# Patient Record
Sex: Female | Born: 1955
Health system: Southern US, Community
[De-identification: ages and names within clinical notes are randomized; demographics above are authoritative.]

## PROBLEM LIST (undated history)

## (undated) DIAGNOSIS — M549 Dorsalgia, unspecified: Secondary | ICD-10-CM

## (undated) DIAGNOSIS — D571 Sickle-cell disease without crisis: Secondary | ICD-10-CM

## (undated) DIAGNOSIS — I10 Essential (primary) hypertension: Secondary | ICD-10-CM

## (undated) DIAGNOSIS — G8929 Other chronic pain: Secondary | ICD-10-CM

## (undated) DIAGNOSIS — D649 Anemia, unspecified: Secondary | ICD-10-CM

## (undated) HISTORY — DX: Other chronic pain: G89.29

## (undated) HISTORY — DX: Anemia, unspecified: D64.9

## (undated) HISTORY — DX: Dorsalgia, unspecified: M54.9

## (undated) HISTORY — DX: Essential (primary) hypertension: I10

---

## 2005-10-31 ENCOUNTER — Emergency Department (HOSPITAL_COMMUNITY): Admission: EM | Admit: 2005-10-31 | Discharge: 2005-10-31 | Payer: Self-pay | Admitting: Emergency Medicine

## 2006-04-04 ENCOUNTER — Encounter: Admission: RE | Admit: 2006-04-04 | Discharge: 2006-04-04 | Payer: Self-pay | Admitting: Internal Medicine

## 2006-05-01 ENCOUNTER — Encounter: Admission: RE | Admit: 2006-05-01 | Discharge: 2006-05-01 | Payer: Self-pay | Admitting: Internal Medicine

## 2006-05-30 ENCOUNTER — Emergency Department (HOSPITAL_COMMUNITY): Admission: EM | Admit: 2006-05-30 | Discharge: 2006-05-30 | Payer: Self-pay | Admitting: Emergency Medicine

## 2006-08-23 ENCOUNTER — Emergency Department (HOSPITAL_COMMUNITY): Admission: EM | Admit: 2006-08-23 | Discharge: 2006-08-23 | Payer: Self-pay | Admitting: Emergency Medicine

## 2007-04-30 ENCOUNTER — Emergency Department (HOSPITAL_COMMUNITY): Admission: EM | Admit: 2007-04-30 | Discharge: 2007-04-30 | Payer: Self-pay | Admitting: Emergency Medicine

## 2007-10-15 ENCOUNTER — Encounter: Admission: RE | Admit: 2007-10-15 | Discharge: 2007-10-15 | Payer: Self-pay | Admitting: Internal Medicine

## 2007-11-28 ENCOUNTER — Emergency Department (HOSPITAL_COMMUNITY): Admission: EM | Admit: 2007-11-28 | Discharge: 2007-11-29 | Payer: Self-pay | Admitting: Emergency Medicine

## 2008-08-12 ENCOUNTER — Encounter: Payer: Self-pay | Admitting: Physician Assistant

## 2008-12-20 ENCOUNTER — Emergency Department (HOSPITAL_COMMUNITY): Admission: EM | Admit: 2008-12-20 | Discharge: 2008-12-20 | Payer: Self-pay | Admitting: Emergency Medicine

## 2009-01-05 ENCOUNTER — Emergency Department (HOSPITAL_COMMUNITY): Admission: EM | Admit: 2009-01-05 | Discharge: 2009-01-05 | Payer: Self-pay | Admitting: Emergency Medicine

## 2009-01-05 ENCOUNTER — Emergency Department (HOSPITAL_COMMUNITY): Admission: EM | Admit: 2009-01-05 | Discharge: 2009-01-05 | Payer: Self-pay | Admitting: Family Medicine

## 2009-03-07 ENCOUNTER — Observation Stay (HOSPITAL_COMMUNITY): Admission: EM | Admit: 2009-03-07 | Discharge: 2009-03-07 | Payer: Self-pay | Admitting: Emergency Medicine

## 2009-03-07 ENCOUNTER — Emergency Department (HOSPITAL_COMMUNITY): Admission: EM | Admit: 2009-03-07 | Discharge: 2009-03-07 | Payer: Self-pay | Admitting: Family Medicine

## 2009-03-21 ENCOUNTER — Ambulatory Visit: Payer: Self-pay | Admitting: Physician Assistant

## 2009-03-21 DIAGNOSIS — K219 Gastro-esophageal reflux disease without esophagitis: Secondary | ICD-10-CM | POA: Insufficient documentation

## 2009-03-21 DIAGNOSIS — M545 Low back pain: Secondary | ICD-10-CM

## 2009-03-21 DIAGNOSIS — D571 Sickle-cell disease without crisis: Secondary | ICD-10-CM

## 2009-03-21 DIAGNOSIS — Z9849 Cataract extraction status, unspecified eye: Secondary | ICD-10-CM

## 2009-03-21 DIAGNOSIS — I10 Essential (primary) hypertension: Secondary | ICD-10-CM | POA: Insufficient documentation

## 2009-03-21 DIAGNOSIS — J309 Allergic rhinitis, unspecified: Secondary | ICD-10-CM | POA: Insufficient documentation

## 2009-03-21 DIAGNOSIS — F329 Major depressive disorder, single episode, unspecified: Secondary | ICD-10-CM

## 2009-03-22 ENCOUNTER — Encounter: Payer: Self-pay | Admitting: Physician Assistant

## 2009-03-22 ENCOUNTER — Emergency Department (HOSPITAL_COMMUNITY): Admission: EM | Admit: 2009-03-22 | Discharge: 2009-03-22 | Payer: Self-pay | Admitting: Emergency Medicine

## 2009-03-25 ENCOUNTER — Emergency Department (HOSPITAL_COMMUNITY): Admission: EM | Admit: 2009-03-25 | Discharge: 2009-03-25 | Payer: Self-pay | Admitting: Emergency Medicine

## 2009-03-25 ENCOUNTER — Encounter: Payer: Self-pay | Admitting: Physician Assistant

## 2009-03-25 LAB — CONVERTED CEMR LAB
HCT: 30.8 %
Hemoglobin: 10.4 g/dL

## 2009-03-29 ENCOUNTER — Ambulatory Visit: Payer: Self-pay | Admitting: Physician Assistant

## 2009-03-29 DIAGNOSIS — S139XXA Sprain of joints and ligaments of unspecified parts of neck, initial encounter: Secondary | ICD-10-CM

## 2009-03-29 DIAGNOSIS — E876 Hypokalemia: Secondary | ICD-10-CM | POA: Insufficient documentation

## 2009-04-01 ENCOUNTER — Telehealth (INDEPENDENT_AMBULATORY_CARE_PROVIDER_SITE_OTHER): Payer: Self-pay | Admitting: *Deleted

## 2009-04-08 LAB — CONVERTED CEMR LAB
BUN: 14 mg/dL (ref 6–23)
Basophils Absolute: 0 10*3/uL (ref 0.0–0.1)
Creatinine, Ser: 0.87 mg/dL (ref 0.40–1.20)
Eosinophils Absolute: 0.1 10*3/uL (ref 0.0–0.7)
Eosinophils Relative: 1 % (ref 0–5)
Glucose, Bld: 103 mg/dL — ABNORMAL HIGH (ref 70–99)
HCT: 30.1 % — ABNORMAL LOW (ref 36.0–46.0)
MCHC: 35.9 g/dL (ref 30.0–36.0)
MCV: 69.7 fL — ABNORMAL LOW (ref 78.0–100.0)
Platelets: 146 10*3/uL — ABNORMAL LOW (ref 150–400)
RDW: 18.1 % — ABNORMAL HIGH (ref 11.5–15.5)

## 2009-04-18 ENCOUNTER — Ambulatory Visit: Payer: Self-pay | Admitting: Physician Assistant

## 2009-04-18 LAB — CONVERTED CEMR LAB
BUN: 6 mg/dL (ref 6–23)
Basophils Relative: 0 % (ref 0–1)
CO2: 23 meq/L (ref 19–32)
Calcium: 9.2 mg/dL (ref 8.4–10.5)
Chloride: 107 meq/L (ref 96–112)
Creatinine, Ser: 0.75 mg/dL (ref 0.40–1.20)
Eosinophils Absolute: 0.1 10*3/uL (ref 0.0–0.7)
Eosinophils Relative: 1 % (ref 0–5)
HCT: 28.7 % — ABNORMAL LOW (ref 36.0–46.0)
MCHC: 35.5 g/dL (ref 30.0–36.0)
MCV: 73 fL — ABNORMAL LOW (ref 78.0–100.0)
Monocytes Relative: 7 % (ref 3–12)
Neutrophils Relative %: 59 % (ref 43–77)
Platelets: 135 10*3/uL — ABNORMAL LOW (ref 150–400)
RBC: 3.93 M/uL (ref 3.87–5.11)

## 2009-04-19 ENCOUNTER — Encounter: Payer: Self-pay | Admitting: Physician Assistant

## 2009-04-20 ENCOUNTER — Ambulatory Visit: Payer: Self-pay | Admitting: Physician Assistant

## 2009-06-03 ENCOUNTER — Telehealth: Payer: Self-pay | Admitting: Physician Assistant

## 2009-06-03 ENCOUNTER — Encounter: Payer: Self-pay | Admitting: Physician Assistant

## 2009-06-09 ENCOUNTER — Telehealth: Payer: Self-pay | Admitting: Physician Assistant

## 2009-06-16 ENCOUNTER — Encounter: Payer: Self-pay | Admitting: Physician Assistant

## 2009-06-27 ENCOUNTER — Ambulatory Visit: Payer: Self-pay | Admitting: Physician Assistant

## 2009-06-27 DIAGNOSIS — H9209 Otalgia, unspecified ear: Secondary | ICD-10-CM | POA: Insufficient documentation

## 2009-06-27 DIAGNOSIS — M546 Pain in thoracic spine: Secondary | ICD-10-CM

## 2009-07-26 ENCOUNTER — Encounter: Payer: Self-pay | Admitting: Physician Assistant

## 2009-08-02 ENCOUNTER — Telehealth: Payer: Self-pay | Admitting: Physician Assistant

## 2009-09-14 ENCOUNTER — Telehealth (INDEPENDENT_AMBULATORY_CARE_PROVIDER_SITE_OTHER): Payer: Self-pay | Admitting: Nurse Practitioner

## 2009-11-15 ENCOUNTER — Ambulatory Visit: Payer: Self-pay | Admitting: Physician Assistant

## 2009-11-15 DIAGNOSIS — R05 Cough: Secondary | ICD-10-CM

## 2009-11-15 DIAGNOSIS — R059 Cough, unspecified: Secondary | ICD-10-CM | POA: Insufficient documentation

## 2009-11-29 ENCOUNTER — Encounter (INDEPENDENT_AMBULATORY_CARE_PROVIDER_SITE_OTHER): Payer: Self-pay | Admitting: Nurse Practitioner

## 2009-11-29 ENCOUNTER — Telehealth: Payer: Self-pay | Admitting: Physician Assistant

## 2009-11-29 ENCOUNTER — Ambulatory Visit: Payer: Self-pay | Admitting: Physician Assistant

## 2009-11-29 LAB — CONVERTED CEMR LAB
CO2: 25 meq/L (ref 19–32)
Calcium: 8.7 mg/dL (ref 8.4–10.5)
Chloride: 104 meq/L (ref 96–112)
Sodium: 139 meq/L (ref 135–145)

## 2009-12-01 ENCOUNTER — Encounter (INDEPENDENT_AMBULATORY_CARE_PROVIDER_SITE_OTHER): Payer: Self-pay | Admitting: Nurse Practitioner

## 2010-02-08 ENCOUNTER — Telehealth: Payer: Self-pay | Admitting: Physician Assistant

## 2010-02-09 ENCOUNTER — Telehealth: Payer: Self-pay | Admitting: Physician Assistant

## 2010-02-17 ENCOUNTER — Telehealth (INDEPENDENT_AMBULATORY_CARE_PROVIDER_SITE_OTHER): Payer: Self-pay | Admitting: *Deleted

## 2010-02-20 ENCOUNTER — Telehealth: Payer: Self-pay | Admitting: Physician Assistant

## 2010-03-17 ENCOUNTER — Telehealth: Payer: Self-pay | Admitting: Physician Assistant

## 2010-06-05 ENCOUNTER — Ambulatory Visit: Payer: Self-pay | Admitting: Nurse Practitioner

## 2010-06-06 ENCOUNTER — Encounter (INDEPENDENT_AMBULATORY_CARE_PROVIDER_SITE_OTHER): Payer: Self-pay | Admitting: Nurse Practitioner

## 2010-06-06 LAB — CONVERTED CEMR LAB
Basophils Absolute: 0 K/uL (ref 0.0–0.1)
Basophils Relative: 1 % (ref 0–1)
Eosinophils Absolute: 0.1 K/uL (ref 0.0–0.7)
Eosinophils Relative: 2 % (ref 0–5)
HCT: 28.4 % — ABNORMAL LOW (ref 36.0–46.0)
Hemoglobin: 10.2 g/dL — ABNORMAL LOW (ref 12.0–15.0)
Lymphocytes Relative: 33 % (ref 12–46)
Lymphs Abs: 1.4 K/uL (ref 0.7–4.0)
MCHC: 35.9 g/dL (ref 30.0–36.0)
MCV: 71.4 fL — ABNORMAL LOW (ref 78.0–100.0)
Monocytes Absolute: 0.3 K/uL (ref 0.1–1.0)
Monocytes Relative: 6 % (ref 3–12)
Neutro Abs: 2.5 K/uL (ref 1.7–7.7)
Neutrophils Relative %: 59 % (ref 43–77)
Platelets: 154 K/uL (ref 150–400)
RBC: 3.98 M/uL (ref 3.87–5.11)
RDW: 17.5 % — ABNORMAL HIGH (ref 11.5–15.5)
Retic Ct Pct: 2 % (ref 0.4–3.1)
WBC: 4.3 10*3/microliter (ref 4.0–10.5)

## 2010-06-07 ENCOUNTER — Telehealth (INDEPENDENT_AMBULATORY_CARE_PROVIDER_SITE_OTHER): Payer: Self-pay | Admitting: Nurse Practitioner

## 2010-06-16 ENCOUNTER — Ambulatory Visit: Payer: Self-pay | Admitting: Oncology

## 2010-06-21 ENCOUNTER — Encounter (INDEPENDENT_AMBULATORY_CARE_PROVIDER_SITE_OTHER): Payer: Self-pay | Admitting: Nurse Practitioner

## 2010-06-21 LAB — COMPREHENSIVE METABOLIC PANEL
BUN: 6 mg/dL (ref 6–23)
CO2: 26 mEq/L (ref 19–32)
Calcium: 8.9 mg/dL (ref 8.4–10.5)
Chloride: 107 mEq/L (ref 96–112)
Creatinine, Ser: 0.86 mg/dL (ref 0.40–1.20)
Total Bilirubin: 1.4 mg/dL — ABNORMAL HIGH (ref 0.3–1.2)

## 2010-06-21 LAB — CBC & DIFF AND RETIC
BASO%: 0.3 % (ref 0.0–2.0)
Basophils Absolute: 0 10*3/uL (ref 0.0–0.1)
EOS%: 2.1 % (ref 0.0–7.0)
HGB: 10.6 g/dL — ABNORMAL LOW (ref 11.6–15.9)
MCH: 25.9 pg (ref 25.1–34.0)
MCHC: 36.6 g/dL — ABNORMAL HIGH (ref 31.5–36.0)
MCV: 70.9 fL — ABNORMAL LOW (ref 79.5–101.0)
MONO%: 4.5 % (ref 0.0–14.0)
NEUT%: 50 % (ref 38.4–76.8)
RDW: 17.1 % — ABNORMAL HIGH (ref 11.2–14.5)
Retic Ct Abs: 74.85 10*3/uL — ABNORMAL HIGH (ref 18.30–72.70)
lymph#: 1.6 10*3/uL (ref 0.9–3.3)

## 2010-06-21 LAB — CHCC SMEAR

## 2010-06-21 LAB — MORPHOLOGY
PLT EST: DECREASED
RBC Comments: ABNORMAL

## 2010-06-21 LAB — LACTATE DEHYDROGENASE: LDH: 148 U/L (ref 94–250)

## 2010-06-22 LAB — HIV ANTIBODY (ROUTINE TESTING W REFLEX): HIV: NONREACTIVE

## 2010-06-22 LAB — HEPATITIS PANEL, ACUTE
HCV Ab: NEGATIVE
Hep B C IgM: NEGATIVE
Hepatitis B Surface Ag: NEGATIVE

## 2010-06-22 LAB — FERRITIN: Ferritin: 60 ng/mL (ref 10–291)

## 2010-06-23 ENCOUNTER — Telehealth (INDEPENDENT_AMBULATORY_CARE_PROVIDER_SITE_OTHER): Payer: Self-pay | Admitting: Internal Medicine

## 2010-06-27 ENCOUNTER — Encounter (INDEPENDENT_AMBULATORY_CARE_PROVIDER_SITE_OTHER): Payer: Self-pay | Admitting: Nurse Practitioner

## 2010-06-27 ENCOUNTER — Ambulatory Visit (HOSPITAL_COMMUNITY)
Admission: RE | Admit: 2010-06-27 | Discharge: 2010-06-27 | Payer: Self-pay | Source: Home / Self Care | Attending: Oncology | Admitting: Oncology

## 2010-07-05 ENCOUNTER — Encounter (INDEPENDENT_AMBULATORY_CARE_PROVIDER_SITE_OTHER): Payer: Self-pay | Admitting: Nurse Practitioner

## 2010-07-07 ENCOUNTER — Encounter (INDEPENDENT_AMBULATORY_CARE_PROVIDER_SITE_OTHER): Payer: Self-pay | Admitting: Nurse Practitioner

## 2010-07-10 ENCOUNTER — Ambulatory Visit: Admit: 2010-07-10 | Payer: Self-pay | Admitting: Nurse Practitioner

## 2010-07-22 ENCOUNTER — Encounter: Payer: Self-pay | Admitting: Internal Medicine

## 2010-07-23 ENCOUNTER — Encounter: Payer: Self-pay | Admitting: Internal Medicine

## 2010-07-27 ENCOUNTER — Ambulatory Visit: Payer: Self-pay | Admitting: Oncology

## 2010-07-31 ENCOUNTER — Encounter (INDEPENDENT_AMBULATORY_CARE_PROVIDER_SITE_OTHER): Payer: Self-pay | Admitting: Internal Medicine

## 2010-07-31 ENCOUNTER — Encounter (INDEPENDENT_AMBULATORY_CARE_PROVIDER_SITE_OTHER): Payer: Self-pay | Admitting: Nurse Practitioner

## 2010-07-31 LAB — CBC & DIFF AND RETIC
Basophils Absolute: 0 10*3/uL (ref 0.0–0.1)
Eosinophils Absolute: 0.1 10*3/uL (ref 0.0–0.5)
HCT: 32.3 % — ABNORMAL LOW (ref 34.8–46.6)
HGB: 11.1 g/dL — ABNORMAL LOW (ref 11.6–15.9)
LYMPH%: 26 % (ref 14.0–49.7)
MCV: 77.9 fL — ABNORMAL LOW (ref 79.5–101.0)
MONO#: 0.3 10*3/uL (ref 0.1–0.9)
MONO%: 6.5 % (ref 0.0–14.0)
NEUT#: 3.3 10*3/uL (ref 1.5–6.5)
NEUT%: 65.7 % (ref 38.4–76.8)
Platelets: 155 10*3/uL (ref 145–400)
Retic %: 2.3 % — ABNORMAL HIGH (ref 0.50–1.50)
WBC: 5.1 10*3/uL (ref 3.9–10.3)

## 2010-08-01 NOTE — Progress Notes (Signed)
Summary: NEEDS TRAMADOL  Phone Note Call from Patient Call back at Home Phone [phone]   Reason for Call: Refill Medication Summary of Call: MARTIN PT. MS Stender CALLED THIS MORNING FOR HER REFILL ON TRAMADOL FROM THE HEALTH DEPT. AND SHE HAD ME TO TALK W/THE LADY IN THE PHARMACY, AND I MENTIONED TO HER THAT SHE WOULD NEED TO FAX OVER A REQUEST. MS Barre SAYS THAT SHE IS NOT FEELING WELL AND NEEDS HER MEDICINE. SHE IS AT THE PHARMACY NOW. Initial call taken by: Leodis Rains,  September 14, 2009 3:51 PM  Follow-up for Phone Call        forward to N. Daphine Deutscher, fnp Follow-up by: Levon Hedger,  September 15, 2009 10:33 AM  Additional Follow-up for Phone Call Additional follow up Details #1::        I refilled tramadol on 09/14/2009 (yesterday) and it should have been faxed to the pharmacy. call pt to see if she recieved the medication from the pharmacy Additional Follow-up by: Lehman Prom FNP,  September 15, 2009 12:02 PM    Additional Follow-up for Phone Call Additional follow up Details #2::    Levon Hedger  September 15, 2009 12:13 PM Left message on machine for pt to return call to the office  Levon Hedger  September 15, 2009 4:45 PM Pt has picked up Rx.

## 2010-08-01 NOTE — Letter (Signed)
Summary: RECORDS FROM PRIOR PCP  RECORDS FROM PRIOR PCP   Imported By: Arta Bruce 07/26/2009 11:33:59  _____________________________________________________________________  External Attachment:    Type:   Image     Comment:   External Document

## 2010-08-01 NOTE — Progress Notes (Signed)
Summary: Hematology Referral  Phone Note Outgoing Call   Summary of Call: Referral ordered for [facility] Send last 2 office visits and recent labs with referral  Initial call taken by: Lehman Prom FNP,  June 07, 2010 3:28 PM  Follow-up for Phone Call        SEND REFERRAL TO [facility] WAITING FOR AN APPT Follow-up by: Cheryll Dessert,  June 07, 2010 7:39 PM

## 2010-08-01 NOTE — Progress Notes (Signed)
Summary: Refill request  Phone Note Call from Patient Call back at [phone]   Caller: C[facility] Summary of Call:  pt needs a refill on omeperzole 20mg .... Initial call taken by: Mikey College CMA,  August 02, 2009 10:09 AM  Follow-up for Phone Call        The pt called back again requesting for refills from her omeprazole 200 mg.Marland KitchenMarland KitchenManon Hilding  August 03, 2009 9:08 AM  Additional Follow-up for Phone Call Additional follow up Details #1::        pt is aware that it is faxed already Additional Follow-up by: Armenia Shannon,  August 03, 2009 9:15 AM    Prescriptions: OMEPRAZOLE 20 MG CPDR (OMEPRAZOLE) one cap by mouth daily  #30 x 5   Entered and Authorized by:   Tereso Newcomer PA-C   Signed by:   Tereso Newcomer PA-C on 08/02/2009   Method used:   Faxed to ...       [facility] (retail)       [address] [place], [place]  [address]       Ph: [phone]       Fax: [phone]   RxID:   [id number]

## 2010-08-01 NOTE — Progress Notes (Signed)
Summary: Persistent allergy symptoms  Phone Note Outgoing Call   Summary of Call: pt says she is still having the cough. pt says its ... health dept.... pt says she wants to use zyrtec. Initial call taken by: Armenia Shannon,  Nov 29, 2009 10:38 AM  Follow-up for Phone Call        Seen as nurse visit - c/o allergy symptoms, postnasal drip, persistent cough.  No other symptoms.  OTC medications, fluids, elevated head of bed and closed windows recommended.  Dutch Quint, RN 11/29/09 11:05 am.

## 2010-08-01 NOTE — Progress Notes (Signed)
Summary: REFILL ON ALLERGY MED & BP  Phone Note Call from Patient Call back at Home Phone [phone]   Reason for Call: Refill Medication Summary of Call: WEAVER PT. Kayla Kayla Woodard SHOWED UP AT THE [facility] THIS MORNING FOR HER BP MED (COZARR) AND HER XYZAL. I SPOKE WITH THE LADY I PHARMACY AND SHE SAYS THAT SINECE Kayla Woodard DOESNT HAVE A SOCIAL # SHE DOESNT QUALIFY FOR THE ICP PROGRAM, BECAUSE THEY WOULD HAVE TO ORDER THE MEDICATIONS BECAUSE THE DON'T BUY IT. PHARMACY SAID THEY WOULD TRY TO WORK WITH HER ON HER BLOOD PRESS. MEDS. AS FAR AS COST WISE. SHE CANT TAKE LISINOPRIL, BECAUSE IT MAKES HER COUGH. Initial call taken by: Leodis Rains,  February 20, 2010 9:09 AM  Follow-up for Phone Call        forward to provider Follow-up by: Armenia Shannon,  February 20, 2010 10:02 AM  Additional Follow-up for Phone Call Additional follow up Details #1::        Why can't she get her prescriptions filled at [facility] pharmacy? Additional Follow-up by: Tereso Newcomer PA-C,  February 20, 2010 2:09 PM    Additional Follow-up for Phone Call Additional follow up Details #2::    SCOTT I CALLED AND SPOKE WITH WANDA IN OUR PHARMACY, AND SHE SAYS THAT Kayla Woodard WOULD HAVE TO GET LOSARTIN, BECAUSE U CAN'T GET COZARR FROM THE DRUG COMP. AND HER XYZAL, SHE'LL HAVE TO GET LORATADINE, BECAUSE SHE WOULD HAVE TO HAVE A S.S. CARD FOR THE DRUG COMP.Cala Bradford Tinnin  February 20, 2010 3:48 PM   Left message on answering machine for pt to call back.Marland KitchenMarland KitchenArmenia Shannon  February 20, 2010 5:27 PM   pt is aware.... Armenia Shannon  February 21, 2010 8:22 AM   New/Updated Medications: LOSARTAN POTASSIUM 100 MG TABS (LOSARTAN POTASSIUM) Take 1 tablet by mouth once a day LORATADINE 10 MG TABS (LORATADINE) Take 1 tablet by mouth once a day as needed for allergies Prescriptions: LORATADINE 10 MG TABS (LORATADINE) Take 1 tablet by mouth once a day as needed for allergies  #30 x 6   Entered and Authorized by:   Tereso Newcomer PA-C   Signed  by:   Tereso Newcomer PA-C on 02/20/2010   Method used:   Faxed to ...       [facility] (retail)       [address] [place], [place]  [address]       Ph: [phone]       Fax: [phone]   RxID:   [id number] LOSARTAN POTASSIUM 100 MG TABS (LOSARTAN POTASSIUM) Take 1 tablet by mouth once a day  #30 x 11   Entered and Authorized by:   Tereso Newcomer PA-C   Signed by:   Tereso Newcomer PA-C on 02/20/2010   Method used:   Faxed to ...       [facility] (retail)       [address] [place], [place]  [address]       Ph: [phone]       Fax: [phone]   RxID:   [id number]

## 2010-08-01 NOTE — Assessment & Plan Note (Signed)
Summary: Sickle Cell Anemia   Vital Signs:  Patient profile:   55 year old female Height:      61.75 inches Weight:      172 pounds BMI:     31.83 O2 Sat:      100 % on Room air Temp:     97.1 degrees F oral Pulse rate:   76 / minute Pulse rhythm:   regular Resp:     18 per minute BP sitting:   120 / 82  (left arm) Cuff size:   regular  Vitals Entered By: Armenia Shannon (June 05, 2010 9:44 AM)  Nutrition Counseling: Patient's BMI is greater than 25 and therefore counseled on weight management options.  O2 Flow:  Room air CC: check up for sickle cell and anemia... pt says she needs refills..., Hypertension Management, Abdominal Pain Is Patient Diabetic? No Pain Assessment Patient in pain? no       Does patient need assistance? Functional Status Self care Ambulation Normal   Primary Care Provider:  Tereso Newcomer PA-C  CC:  check up for sickle cell and anemia... pt says she needs refills..., Hypertension Management, and Abdominal Pain.  History of Present Illness:  Pt into the office for f/u on anemia and sickle Cell Pt established at [facility], which she reports she went in January 2011 but pt accured a bill and she is not willing to return.  She states that her medication was changed from Tramadol to Morphine but she is not able to pay for the medication.   Last hospitalization for sickle Cell was 1 year ago. Sister(living - still in [place])  and twin is affected with the disease (deceased) Pt was last hospitalized at [facility] on last year.  No hospitalizations this year.  Social - pt is from [place]. She has been in this country for 7 years. Pt has a case worker that is helping with her immigration paperwork.  Dyspepsia History:      She has no alarm features of dyspepsia including no history of melena, hematochezia, dysphagia, persistent vomiting, or involuntary weight loss > 5%.  There is a prior history of GERD.  The patient does not have a prior history of  documented ulcer disease.  The dominant symptom is not heartburn or acid reflux.  An H-2 blocker medication is not currently being taken.  She has no history of a positive H. Pylori serology.    Hypertension History:      She denies headache, chest pain, and palpitations.  Pt has taken her BP meds today.        Positive major cardiovascular risk factors include hypertension.  Negative major cardiovascular risk factors include female age less than 48 years old and non-tobacco-user status.      Habits & Providers  Alcohol-Tobacco-Diet     Alcohol drinks/day: 0     Tobacco Status: never  Exercise-Depression-Behavior     Does Patient Exercise: yes     Type of exercise: walk     Times/week: 4     Depression Counseling: further diagnostic testing and/or other treatment is indicated     Drug Use: no  Current Medications (verified): 1)  Tramadol Hcl 50 Mg Tabs (Tramadol Hcl) .... Take 1 Tablet By Mouth Three Times A Day As Needed For Severe Pain. 2)  Protonix 40 Mg Tbec (Pantoprazole Sodium) .... Take 1 Tablet By Mouth Once A Day 3)  Flonase 50 Mcg/act Susp (Fluticasone Propionate) .Marland Kitchen.. 1 Spray Each Nostril Once Daily  4)  Losartan Potassium 100 Mg Tabs (Losartan Potassium) .... Take 1 Tablet By Mouth Once A Day 5)  Loratadine 10 Mg Tabs (Loratadine) .... Take 1 Tablet By Mouth Once A Day As Needed For Allergies  Allergies (verified): 1)  ! * Oxycodone  Social History: moved to [place] 7 years ago from [place] in [place] (husband and children still in [place]) Never Smoked Alcohol use-no Drug use-no not working  Review of Systems General:  Denies fever. CV:  Denies chest pain or discomfort and shortness of breath with exertion. Resp:  Denies chest discomfort and shortness of breath. GI:  Complains of indigestion. MS:  Complains of low back pain; legs at time, neck.  Physical Exam  General:  alert.   Head:  normocephalic.   Ears:  ear piercing(s) noted.   Lungs:  normal breath  sounds.   Heart:  normal rate and regular rhythm.   Msk:  up to the exam table Neurologic:  alert & oriented X3.   Skin:  color normal.   Psych:  Oriented X3.     Impression & Recommendations:  Problem # 1:  ESSENTIAL HYPERTENSION, BENIGN (ICD-401.1)  BP stable at this time continue  current medications Her updated medication list for this problem includes:    Losartan Potassium 100 Mg Tabs (Losartan potassium) .Marland Kitchen... Take 1 tablet by mouth once a day  Orders: T-Lipid Profile (78295-62130) T-Comprehensive Metabolic Panel (86578-46962) T-TSH (95284-13244) Rapid HIV  (01027)  Problem # 2:  GERD (ICD-530.81) Advised pt of foods to avoid she can get refills on protonix from [place] pharmacy Her updated medication list for this problem includes:    Protonix 40 Mg Tbec (Pantoprazole sodium) .Marland Kitchen... Take 1 tablet by mouth once a day  Problem # 3:  SICKLE-CELL DISEASE, UNSPECIFIED (ICD-282.60)  pt is NOT going to the [facility] due to cost will refill pain medications  Orders: T-CBC w/Diff (25366-44034) T-Sed Rate (Automated) (74259-56387) T-Reticulocyte Count, Manual (56433)  Problem # 4:  NEED PROPHYLACTIC VACCINATION&INOCULATION FLU (ICD-V04.81) given today in office  Complete Medication List: 1)  Tramadol Hcl 50 Mg Tabs (Tramadol hcl) .... Take 1 tablet by mouth three times a day as needed for severe pain. 2)  Protonix 40 Mg Tbec (Pantoprazole sodium) .... Take 1 tablet by mouth once a day 3)  Flonase 50 Mcg/act Susp (Fluticasone propionate) .Marland Kitchen.. 1 spray each nostril once daily 4)  Losartan Potassium 100 Mg Tabs (Losartan potassium) .... Take 1 tablet by mouth once a day 5)  Loratadine 10 Mg Tabs (Loratadine) .... Take 1 tablet by mouth once a day as needed for allergies  Other Orders: Flu Vaccine 73yrs + (29518) Admin 1st Vaccine (84166)  Hypertension Assessment/Plan:      The patient's hypertensive risk group is category A: No risk factors and no target organ  damage.  Today's blood pressure is 120/82.  Her blood pressure goal is < 140/90.  Patient Instructions: 1)  You have been given the flu vaccine today 2)  Sign a release to get records from [facility].  Need last  2 office visits and labs. 3)  There is a medication named Hydrourea that may be helpful with your sickle cell however this medication must be monitored by a specialist.  Will see if the specialist here in [place] will be able to assist. 4)  Be sure to drink plenty of water 5)  Refills on Tramadol sent to [facility] 6)  Schedule an appointment  for a Complete physical exam 7)  You will need EKG, u/a. 8)  Will review labs   Orders Added: 1)  Est. Patient Level IV [[address]] 2)  T-Lipid Profile [80061-22930] 3)  T-Comprehensive Metabolic Panel [80053-22900] 4)  T-CBC w/Diff [60454-09811] 5)  T-TSH [91478-29562] 6)  Rapid HIV  [92370] 7)  T-Sed Rate (Automated) [13086-57846] 8)  T-Reticulocyte Count, Manual [85044] 9)  Flu Vaccine 53yrs + [90658] 10)  Admin 1st Vaccine [96295]   Immunizations Administered:  Influenza Vaccine # 1:    Vaccine Type: Fluvax 3+    Site: left deltoid    Mfr: GlaxoSmithKline    Dose: 0.5 ml    Route: IM    Given by: Levon Hedger    Exp. Date: 12/30/2010    Lot #: [id number]    VIS given: 01/24/10 version given June 05, 2010.  Flu Vaccine Consent Questions:    Do you have a history of severe allergic reactions to this vaccine? no    Any prior history of allergic reactions to egg and/or gelatin? no    Do you have a sensitivity to the preservative Thimersol? no    Do you have a past history of Guillan-Barre Syndrome? no    Do you currently have an acute febrile illness? no    Have you ever had a severe reaction to latex? no    Vaccine information given and explained to patient? yes    Are you currently pregnant? no    ndc  [id number]  Immunizations Administered:  Influenza Vaccine # 1:    Vaccine Type:  Fluvax 3+    Site: left deltoid    Mfr: GlaxoSmithKline    Dose: 0.5 ml    Route: IM    Given by: Levon Hedger    Exp. Date: 12/30/2010    Lot #: [id number]    VIS given: 01/24/10 version given June 05, 2010.  Prevention & Chronic Care Immunizations   Influenza vaccine: Fluvax 3+  (06/05/2010)    Tetanus booster: 07/07/2007: Historical per patient    Pneumococcal vaccine: Not documented  Colorectal Screening   Hemoccult: NEG x 3  (06/09/2009)    Colonoscopy: Not documented  Other Screening   Pap smear: Not documented    Mammogram: Not documented   Smoking status: never  (06/05/2010)  Lipids   Total Cholesterol: Not documented   LDL: Not documented   LDL Direct: Not documented   HDL: Not documented   Triglycerides: Not documented  Hypertension   Last Blood Pressure: 120 / 82  (06/05/2010)   Serum creatinine: 0.74  (11/29/2009)   Serum potassium 4.4  (11/29/2009) CMP ordered   Self-Management Support :    Hypertension self-management support: Not documented   Nursing Instructions: Give Flu vaccine today   Laboratory Results  Date/Time Received: June 05, 2010 11:21 AM   Other Tests  Rapid HIV: negative

## 2010-08-01 NOTE — Progress Notes (Signed)
Summary: Refills  Phone Note Call from Patient Call back at [phone]   Summary of Call: The pt needs more refills from the tramadol medication and she wanted to share with the provider that lisinopril make her sick (cough).  [facility] pharmacy [address]).  Please let her know when is ready. Alben Spittle PA-c  Initial call taken by: Manon Hilding,  February 17, 2010 12:01 PM  Follow-up for Phone Call        Rx for tramadol printed - fax to [facility] also have note from [facility] - they no longer have ARBs on the formulary.  Will have PCP decide what to change pt to Follow-up by: Lehman Prom FNP,  February 17, 2010 4:37 PM  Additional Follow-up for Phone Call Additional follow up Details #1::        Rx faxed to [facility]. pt notified. Additional Follow-up by: Levon Hedger,  February 17, 2010 4:41 PM    Prescriptions: TRAMADOL HCL 50 MG TABS (TRAMADOL HCL) Take 1 tablet by mouth three times a day as needed for severe pain.  #90 x 0   Entered and Authorized by:   Lehman Prom FNP   Signed by:   Lehman Prom FNP on 02/17/2010   Method used:   Printed then faxed to ...       [facility] (retail)       [address] [place], [place]  [address]       Ph: [phone]       Fax: [phone]   RxID:   [id number]

## 2010-08-01 NOTE — Progress Notes (Signed)
Summary: Cozaar refill  Phone Note Call from Patient   Summary of Call: THE PT NEEDS REFILLS FROM ALL HER MEDICATIONS ESPCIEALLY LISINOPRIL. Marland Kitchen PT STILL HAS SOME REFILLS BUT i HAD PROBLEMS FOR HER TO FOLLOW ME OVER THE PHONE. WEAVER PA-C Initial call taken by: Manon Hilding,  February 08, 2010 11:34 AM  Follow-up for Phone Call        Left message on answering machine for pt to call back...Marland KitchenMarland KitchenArmenia Shannon  February 08, 2010 12:49 PM   pt says she needs a refill on her cozaar... pt says the health dept no longer carries med and at [facility] $56... spoke with health dept and they said they no longer carry that med.... pt would like to know if you can get her another med but cheaper if she has to go to [facility].... pt says she can not take lisinopril because it makes her cough... she said she took one pill from someone and she coughed all night last night Follow-up by: Armenia Shannon,  February 08, 2010 4:43 PM  Additional Follow-up for Phone Call Additional follow up Details #1::        She can get at [facility]. pharmacy. Will send Rx for cozaar there.  Additional Follow-up by: Tereso Newcomer PA-C,  February 08, 2010 5:25 PM    Additional Follow-up for Phone Call Additional follow up Details #2::    pt is aware Follow-up by: Armenia Shannon,  February 09, 2010 9:49 AM  Prescriptions: COZAAR 100 MG TABS (LOSARTAN POTASSIUM) Take 1 tablet by mouth once a day for blood pressure  #30 x 5   Entered and Authorized by:   Tereso Newcomer PA-C   Signed by:   Tereso Newcomer PA-C on 02/08/2010   Method used:   Faxed to ...       [facility] (retail)       [address] [place], [place]  [address]       Ph: [phone]       Fax: [phone]   RxID:   [id number]

## 2010-08-01 NOTE — Progress Notes (Signed)
  Phone Note Call from Patient   Summary of Call: pt says she needs a med for allergies Initial call taken by: Manon Hilding,  February 09, 2010 9:48 AM  Follow-up for Phone Call        pt says she needs something called in a med for her allergies.... health dept Follow-up by: Armenia Shannon,  February 09, 2010 9:52 AM  Additional Follow-up for Phone Call Additional follow up Details #1::        pt aware Additional Follow-up by: Armenia Shannon,  February 09, 2010 12:07 PM    Prescriptions: XYZAL 5 MG TABS (LEVOCETIRIZINE DIHYDROCHLORIDE) 1 tab by mouth daily for allergies  #30 x 11   Entered and Authorized by:   Tereso Newcomer PA-C   Signed by:   Tereso Newcomer PA-C on 02/09/2010   Method used:   Faxed to ...       [facility] Department (retail)       [address] [place], [place]  [address]       Ph: [phone]       Fax: [phone]   RxID:   [id number]

## 2010-08-01 NOTE — Letter (Signed)
Summary: Handout Printed  Printed Handout:  - Sickle Cell Anemia

## 2010-08-01 NOTE — Assessment & Plan Note (Signed)
Summary: SICK-A-CELL ISSUE//KT   Vital Signs:  Patient profile:   55 year old female Height:      61.75 inches Weight:      163 pounds Temp:     97.9 degrees F oral Pulse rate:   83 / minute Pulse rhythm:   regular Resp:     16 per minute BP sitting:   156 / 98  (right arm) Cuff size:   regular  Vitals Entered By: [place] Shannon (Nov 15, 2009 9:18 AM) CC: sick-a-cell issues, cough for 1 mth. (allergy cough), OTC meds.nothing works, Hypertension Management Is Patient Diabetic? No Pain Assessment Patient in pain? no        Primary Care Provider:  Tereso Newcomer PA-C  CC:  sick-a-cell issues, cough for 1 mth. (allergy cough), OTC meds.nothing works, and Hypertension Management.  History of Present Illness: Scheduled appt for "sickle cell issues."  But, states she just needs a refill on meds.    Sickle Cell:  She is seeing doctor at [facility].  She is not having more pain.  She has chronic pain and it is controlled with the Tramadol.  She wants a refill.  She says the doctor at [facility] wanted to change her to morphine.  But, she cannot take this due to side effects.  She says her pain is "everywhere."  States there are no changes in her pain.  GERD:  She states the omeprazole is not enough.  Still notes dyspepsia.  + belching.  No dysphagia.  No hematemesis.  No melena or hematochezia.  No weight loss.  She does note some vomiting at times.  She did not do UGI series I asked her to do several mos. ago.  Not interested in doing this.  States, "I have many many bills."  Cough:  Notes a cough for one month.  No fever.  Non-productive.  Notes headache.  No sore throat.  No dyspnea.  No anterior chest pain.  Notes some rib cage pain with sickle cell.  No sneezing.  No post nasal drip.  Notes upper airway noise but no true wheezing.  No hemoptysis.     Hypertension History:      She complains of headache, but denies dyspnea with exertion.        Positive major cardiovascular risk factors  include hypertension.  Negative major cardiovascular risk factors include female age less than 59 years old and non-tobacco-user status.     Current Medications (verified): 1)  Tramadol Hcl 50 Mg Tabs (Tramadol Hcl) .... Take 1 Tablet By Mouth Three Times A Day As Needed For Severe Pain. 2)  Omeprazole 20 Mg Cpdr (Omeprazole) .... One Cap By Mouth Daily 3)  Lisinopril 20 Mg Tabs (Lisinopril) .... Take 1 Tablet By Mouth Once A Day 4)  Protonix 40 Mg Tbec (Pantoprazole Sodium) .... Take 1 Tablet By Mouth Two Times A Day 5)  Flonase 50 Mcg/act Susp (Fluticasone Propionate) .Marland Kitchen.. 1 Spray Each Nostril Once Daily  Allergies (verified): 1)  ! * Oxycodone  Physical Exam  General:  alert, well-developed, and well-nourished.   Head:  normocephalic and atraumatic.   Eyes:  pupils equal, pupils round, and pupils reactive to light.   Ears:  R ear normal and L ear normal.   Nose:  no external deformity.   Mouth:  pharynx pink and moist.   Neck:  supple and no cervical lymphadenopathy.   Lungs:  normal breath sounds, no crackles, and no wheezes.   Heart:  normal rate  and regular rhythm.   Neurologic:  alert & oriented X3 and cranial nerves II-XII intact.   Psych:  normally interactive.     Impression & Recommendations:  Problem # 1:  COUGH (ICD-786.2) ? related to GERD ACE may be playing a role as well no wheezing on exam she has never been a smoker no infectious symptoms   see below  Problem # 2:  GERD (ICD-530.81) increase omeprazole from 20 to 40 mg once daily  The following medications were removed from the medication list:    Protonix 40 Mg Tbec (Pantoprazole sodium) .Marland Kitchen... Take 1 tablet by mouth two times a day Her updated medication list for this problem includes:    Omeprazole 40 Mg Cpdr (Omeprazole) .Marland Kitchen... Take 1 capsule by mouth once a day for acid reflux  Problem # 3:  ESSENTIAL HYPERTENSION, BENIGN (ICD-401.1) uncontrolled today no meds yet ? ACE induced cough change  Lisinopril to Cozaar  The following medications were removed from the medication list:    Lisinopril 20 Mg Tabs (Lisinopril) .Marland Kitchen... Take 1 tablet by mouth once a day Her updated medication list for this problem includes:    Cozaar 100 Mg Tabs (Losartan potassium) .Marland Kitchen... Take 1 tablet by mouth once a day for blood pressure  Problem # 4:  SICKLE-CELL DISEASE, UNSPECIFIED (ICD-282.60) refill tramadol f/u with physician at [facility]  Complete Medication List: 1)  Tramadol Hcl 50 Mg Tabs (Tramadol hcl) .... Take 1 tablet by mouth three times a day as needed for severe pain. 2)  Omeprazole 40 Mg Cpdr (Omeprazole) .... Take 1 capsule by mouth once a day for acid reflux 3)  Flonase 50 Mcg/act Susp (Fluticasone propionate) .Marland Kitchen.. 1 spray each nostril once daily 4)  Cozaar 100 Mg Tabs (Losartan potassium) .... Take 1 tablet by mouth once a day for blood pressure  Hypertension Assessment/Plan:      The patient's hypertensive risk group is category A: No risk factors and no target organ damage.  Today's blood pressure is 156/98.    Patient Instructions: 1)  Increase Omeprazole from 20 mg to 40 mg once daily.  You have a new prescription for it. 2)  When you get the new prescription for Cozaar, stop the Lisinopril and start the Cozaar. 3)  Return to the lab 2 weeks after you change to Cozaar for BP check and BMET. 4)  Please schedule a follow-up appointment in 6-8 weeks with Scott for BP and GERD and cough.  5)  You should take the prescriptions to the [facility] pharmacy [facility]. Prescriptions: COZAAR 100 MG TABS (LOSARTAN POTASSIUM) Take 1 tablet by mouth once a day for blood pressure  #30 x 5   Entered and Authorized by:   Tereso Newcomer PA-C   Signed by:   Tereso Newcomer PA-C on 11/15/2009   Method used:   Print then Give to Patient   RxID:   [id number] OMEPRAZOLE 40 MG CPDR (OMEPRAZOLE) Take 1 capsule by mouth once a day  for acid reflux  #30 x 5   Entered and Authorized by:   Tereso Newcomer PA-C   Signed by:   Tereso Newcomer PA-C on 11/15/2009   Method used:   Print then Give to Patient   RxID:   [id number] TRAMADOL HCL 50 MG TABS (TRAMADOL HCL) Take 1 tablet by mouth three times a day as needed for severe pain.  #90 x 2   Entered and  Authorized by:   Tereso Newcomer PA-C   Signed by:   Tereso Newcomer PA-C on 11/15/2009   Method used:   Print then Give to Patient   RxID:   [id number]

## 2010-08-01 NOTE — Letter (Signed)
Summary: *HSN Results Follow up  [facility]  [address] [place], [place] [address]   Phone: [phone]  Fax: [phone]      12/01/2009   Kayla Woodard [address] [place], [place]  [address]   Dear  Ms. Kayla Woodard,                            ____S.Drinkard,FNP   ____D. Gore,FNP       ____B. McPherson,MD   ____V. Rankins,MD    ____E. Mulberry,MD    _X___N. Daphine Deutscher, FNP  ____D. Reche Dixon, MD    ____K. Philipp Deputy, MD    ____Other     This letter is to inform you that your recent test(s):  _______Pap Smear    ___X____Lab Test     _______X-ray   ___X____ is within acceptable limits  _______ requires a medication change  _______ requires a follow-up lab visit  _______ requires a follow-up visit with your provider   Comments: Labs done during your recent office visit are normal.       _________________________________________________________ If you have any questions, please contact our office [phone].                    Sincerely,    Lehman Prom FNP [facility]

## 2010-08-01 NOTE — Progress Notes (Signed)
Summary: NEEDS REFILLS  Phone Note Call from Patient Call back at Home Phone [phone]   Reason for Call: Refill Medication Summary of Call: Kayla Woodard PT. MS [name] CALLD AND SAYS THAT SHE NEEDS A REFILL ON HER TRAMADOL, FLONASE, LOSARTIN POTASIUM, OMEPRAZOLE, AND LORATADINE CALLED INTO [facility] AND SHE DOESNT WANT TO USE THE HEALTH DEPT ANYMORE. Initial call taken by: Leodis Rains,  March 17, 2010 2:34 PM  Follow-up for Phone Call        Flonase, Losartan, Loratadine all filled at [facility] pharmacy recently with plenty of refills.  Will change Omeprazole to Protonix since that is what we carry.  Follow-up by: Tereso Newcomer PA-C,  March 20, 2010 1:08 PM  Additional Follow-up for Phone Call Additional follow up Details #1::        Pt. advised of refills and change of med.  Dutch Quint RN  March 20, 2010 2:09 PM     New/Updated Medications: PROTONIX 40 MG TBEC (PANTOPRAZOLE SODIUM) Take 1 tablet by mouth once a day Prescriptions: TRAMADOL HCL 50 MG TABS (TRAMADOL HCL) Take 1 tablet by mouth three times a day as needed for severe pain.  #90 x 0   Entered and Authorized by:   Tereso Newcomer PA-C   Signed by:   Tereso Newcomer PA-C on 03/20/2010   Method used:   Faxed to ...       [facility] (retail)       [address] [place], [place]  [address]       Ph: [phone]       Fax: [phone]   RxID:   [id number] PROTONIX 40 MG TBEC (PANTOPRAZOLE SODIUM) Take 1 tablet by mouth once a day  #30 x 5   Entered and Authorized by:   Tereso Newcomer PA-C   Signed by:   Tereso Newcomer PA-C on 03/20/2010   Method used:   Faxed to ...       [facility] - [facility] (retail)       [address] [place], [place]  [address]       Ph: [phone]       Fax: [phone]   RxID:   [id number]

## 2010-08-01 NOTE — Letter (Signed)
Summary: Lipid Letter  [facility]  [address] [place], [place] [address]   Phone: [phone]  Fax: [phone]    06/06/2010  Gerlean Ren [address] [place], [place]  [address]  Dear Rosanna Randy:  We have carefully reviewed your last lipid profile from 06/05/2010 and the results are noted below with a summary of recommendations for lipid management.    Cholesterol:       146     Goal: less than 200   HDL "good" Cholesterol:   49     Goal: greater than 40   LDL "bad" Cholesterol:   88     Goal: less than 100   Triglycerides:       47     Goal: less than 150    Labs done during your recent office visit show that your cholesterol is doing good. You are anemic but that is probably from your history of sickle cell anemia.     Current Medications: 1)    Tramadol Hcl 50 Mg Tabs (Tramadol hcl) .... Take 1 tablet by mouth three times a day as needed for severe pain. 2)    Protonix 40 Mg Tbec (Pantoprazole sodium) .... Take 1 tablet by mouth once a day 3)    Flonase 50 Mcg/act Susp (Fluticasone propionate) .Marland Kitchen.. 1 spray each nostril once daily 4)    Losartan Potassium 100 Mg Tabs (Losartan potassium) .... Take 1 tablet by mouth once a day 5)    Loratadine 10 Mg Tabs (Loratadine) .... Take 1 tablet by mouth once a day as needed for allergies  If you have any questions, please call. We appreciate being able to work with you.   Sincerely,    Lehman Prom, FNP [facility]

## 2010-08-03 NOTE — Letter (Signed)
[facility]   Imported By: Arta Bruce 07/18/2010 09:43:53  _____________________________________________________________________  External Attachment:    Type:   Image     Comment:   External Document

## 2010-08-03 NOTE — Letter (Signed)
Summary: REQUESTING RECORDS FROM [facility]  REQUESTING RECORDS FROM [facility]   Imported By: Arta Bruce 06/27/2010 12:29:51  _____________________________________________________________________  External Attachment:    Type:   Image     Comment:   External Document

## 2010-08-03 NOTE — Progress Notes (Signed)
Summary: Rx Clarification  Phone Note From Pharmacy   Caller: Thora Call For: Dr. Delrae Alfred  Details for Reason: Is it ok to take Potassium 20 mEq? Summary of Call: [place] ... Ria Comment from [facility] called... Wants to let you know that [facility] has prescribed pt. Potassium 20 mEq... She is to take it x2 a day for 7 days then after, 1 daily w/2 refills.... Wants to make sure that this is ok by you..... Hale Drone CMA  June 23, 2010 10:40 AM   Follow-up for Phone Call        Please call the cancer center and find out what her last potassium was, but, yes, may fill the potassium.   Follow-up by: Julieanne Manson MD,  June 25, 2010 4:24 PM  Additional Follow-up for Phone Call Additional follow up Details #1::        [facility] K [facility]21/10 [facility]1. Additional Follow-up by: Gaylyn Cheers RN,  June 27, 2010 2:27 PM    Additional Follow-up for Phone Call Additional follow up Details #2::    Thankyou--was the potassium filled?  If so, need to document in EMR. If not, needs to be filled as above ASAP.  Julieanne Manson MD  June 28, 2010 1:28 PM   Pricilla @ [facility] called.... Potassium was filled today..... Hale Drone CMA  June 29, 2010 4:19 PM   Please document in med section as was called in to pharmacy--the first 7 days is different.  Also need to document how many pills dispensed and if any refills.  Need to document exactly as how given to pharmacy please.  Julieanne Manson MD  July 04, 2010 8:49 AM   Dr. Delrae Alfred -- Pt. was dispensed 60 tab -- Pt. is to take 1tab  two times a day for 7 days and 1 tab once daily thereafter -- Pt. p/u Rx on 06/29/10-- It's been documented in EMR -- Thanks .... Hale Drone CMA  July 07, 2010 9:22 AM   New/Updated Medications: POTASSIUM CHLORIDE CRYS CR 20 MEQ CR-TABS (POTASSIUM CHLORIDE CRYS CR) 1 tab  two times a day for 7 days and 1tab qd thereafter. Nurse Visit   Current  Medications (verified): 1)  Tramadol Hcl 50 Mg Tabs (Tramadol Hcl) .... Take 1 Tablet By Mouth Three Times A Day As Needed For Severe Pain. 2)  Protonix 40 Mg Tbec (Pantoprazole Sodium) .... Take 1 Tablet By Mouth Once A Day 3)  Flonase 50 Mcg/act Susp (Fluticasone Propionate) .Marland Kitchen.. 1 Spray Each Nostril Once Daily 4)  Losartan Potassium 100 Mg Tabs (Losartan Potassium) .... Take 1 Tablet By Mouth Once A Day 5)  Loratadine 10 Mg Tabs (Loratadine) .... Take 1 Tablet By Mouth Once A Day As Needed For Allergies 6)  Potassium Chloride Crys Cr 20 Meq Cr-Tabs (Potassium Chloride Crys Cr) .Marland Kitchen.. 1 Tab  Two Times A Day For 7 Days and 1tab Qd Thereafter.  Allergies (verified): 1)  ! * Oxycodone Prescriptions: POTASSIUM CHLORIDE CRYS CR 20 MEQ CR-TABS (POTASSIUM CHLORIDE CRYS CR) 1 tab  two times a day for 7 days and 1tab qd thereafter.  #60 x 0   Entered by:   Hale Drone CMA   Authorized by:   Lehman Prom FNP   Signed by:   Julieanne Manson MD on 07/10/2010   Method used:   Faxed to ...       [facility] (retail)  [address] [place], [place]  [address]       Ph: [phone]       Fax: [phone]   RxID:   [id number]   Prescriptions: POTASSIUM CHLORIDE CRYS CR 20 MEQ CR-TABS (POTASSIUM CHLORIDE CRYS CR) 1 tab  two times a day for 7 days and 1tab qd thereafter.  #60 x 0   Entered by:   Hale Drone CMA   Authorized by:   Lehman Prom FNP   Signed by:   Julieanne Manson MD on 07/10/2010   Method used:   Faxed to ...       [facility] (retail)       [address] [place], [place]  [address]       Ph: [phone]       Fax: [phone]   RxID:   [id number]

## 2010-08-03 NOTE — Letter (Signed)
Summary: DR.JAMES GRANFORTUNA / EVALUATION  DR.JAMES GRANFORTUNA / EVALUATION   Imported By: Arta Bruce 07/21/2010 16:48:50  _____________________________________________________________________  External Attachment:    Type:   Image     Comment:   External Document

## 2010-08-03 NOTE — Letter (Signed)
Summary: [facility]ING REC[facility]DS [facility]PT NOT SEEN  REQUESTING RECORDS FROM [facility]PT NOT SEEN   Imported By: Arta Bruce 07/18/2010 11:52:59  _____________________________________________________________________  External Attachment:    Type:   Image     Comment:   External Document

## 2010-08-03 NOTE — Letter (Signed)
Summary: HEMATOLOGY/MEDICAL ONCOLOGY  HEMATOLOGY/MEDICAL ONCOLOGY   Imported By: Arta Bruce 07/21/2010 16:36:33  _____________________________________________________________________  External Attachment:    Type:   Image     Comment:   External Document

## 2010-08-23 NOTE — Letter (Signed)
Summary: HEMATOLOGY/MEDICAL ONCOLOGY  HEMATOLOGY/MEDICAL ONCOLOGY   Imported By: Arta Bruce 08/17/2010 12:07:35  _____________________________________________________________________  External Attachment:    Type:   Image     Comment:   External Document

## 2010-08-29 NOTE — Progress Notes (Signed)
Summary: [facility]: Office Progress Note  [facility]: Office Progress Note   Imported By: Earl Many 08/23/2010 08:31:44  _____________________________________________________________________  External Attachment:    Type:   Image     Comment:   External Document

## 2010-09-01 ENCOUNTER — Telehealth (INDEPENDENT_AMBULATORY_CARE_PROVIDER_SITE_OTHER): Payer: Self-pay | Admitting: Nurse Practitioner

## 2010-09-12 NOTE — Progress Notes (Signed)
Summary: Stomach problems  Phone Note Call from Patient   Reason for Call: Talk to Nurse Summary of Call: Want to be see on Monday for stomach problems.420 pm Returned pt's call. Pt. reports needing medicine like Prevacid because her stomach is bothering her and wants NP  appt. Pt. offered appt. with Zena Amos and advised trying an OTC med until the appt. on 3/14. Pt. declined appt. as her eligibility is runnning out and prefers to go to the ER. Sharen Heck RN Initial call taken by: Nicholaus Bloom,  September 01, 2010 2:27 PM  Follow-up for Phone Call        Pt has an Rx for protonix - she most likely only needed refills which can be done and she can get from [facility] pharmacy BEFORE her card runs out. I have already sent the refill. advise pt to check with the pharmacy for availabity AND keep her appt with provider Follow-up by: Lehman Prom FNP,  September 04, 2010 11:13 AM  Additional Follow-up for Phone Call Additional follow up Details #1::        Left message on answering machine for pt. to return call.  Dutch Quint RN  September 04, 2010 5:16 PM  Pt. notified of provider's response and completed refill.  Verbalized understanding and agreement.  Dutch Quint RN  September 05, 2010 2:32 PM     Prescriptions: PROTONIX 40 MG TBEC (PANTOPRAZOLE SODIUM) Take 1 tablet by mouth once a day  #30 x 11   Entered and Authorized by:   Lehman Prom FNP   Signed by:   Lehman Prom FNP on 09/04/2010   Method used:   Faxed to ...       [facility] (retail)       [address] [place], [place]  [address]       Ph: [phone]       Fax: [phone]   RxID:   [id number]

## 2010-10-06 LAB — CBC
HCT: 32.2 % — ABNORMAL LOW (ref 36.0–46.0)
Hemoglobin: 10.4 g/dL — ABNORMAL LOW (ref 12.0–15.0)
MCHC: 34 g/dL (ref 30.0–36.0)
MCV: 76.8 fL — ABNORMAL LOW (ref 78.0–100.0)
Platelets: 145 10*3/uL — ABNORMAL LOW (ref 150–400)
Platelets: 141 10*3/uL — ABNORMAL LOW (ref 150–400)
RBC: 3.98 MIL/uL (ref 3.87–5.11)
RDW: 19.3 % — ABNORMAL HIGH (ref 11.5–15.5)
RDW: 18.8 % — ABNORMAL HIGH (ref 11.5–15.5)
WBC: 4.9 10*3/uL (ref 4.0–10.5)

## 2010-10-06 LAB — DIFFERENTIAL
Basophils Absolute: 0 10*3/uL (ref 0.0–0.1)
Basophils Absolute: 0 10*3/uL (ref 0.0–0.1)
Eosinophils Absolute: 0.1 10*3/uL (ref 0.0–0.7)
Eosinophils Relative: 1 % (ref 0–5)
Lymphocytes Relative: 41 % (ref 12–46)
Lymphs Abs: 2.2 10*3/uL (ref 0.7–4.0)
Neutro Abs: 1.8 10*3/uL (ref 1.7–7.7)
Neutrophils Relative %: 49 % (ref 43–77)

## 2010-10-06 LAB — BASIC METABOLIC PANEL
BUN: 6 mg/dL (ref 6–23)
BUN: 6 mg/dL (ref 6–23)
Calcium: 8.4 mg/dL (ref 8.4–10.5)
Chloride: 104 mEq/L (ref 96–112)
Creatinine, Ser: 0.91 mg/dL (ref 0.4–1.2)
Creatinine, Ser: 0.74 mg/dL (ref 0.4–1.2)
GFR calc non Af Amer: >60 mL/min (ref >60)
GFR calc non Af Amer: >60 mL/min (ref >60)
Glucose, Bld: 109 mg/dL — ABNORMAL HIGH (ref 70–99)
Glucose, Bld: 88 mg/dL (ref 70–99)

## 2010-10-06 LAB — URINE CULTURE
Colony Count: NO GROWTH
Culture: NO GROWTH

## 2010-10-06 LAB — RETICULOCYTES
Retic Count, Absolute: 96.8 10*3/uL (ref 19.0–186.0)
Retic Ct Pct: 2.5 % (ref 0.4–3.1)

## 2010-10-06 LAB — URINALYSIS, ROUTINE W REFLEX MICROSCOPIC
Glucose, UA: NEGATIVE mg/dL
Hgb urine dipstick: NEGATIVE
Protein, ur: NEGATIVE mg/dL
Specific Gravity, Urine: 1.011 (ref 1.005–1.030)

## 2010-10-06 LAB — APTT: aPTT: 23 seconds — ABNORMAL LOW (ref 24–37)

## 2010-10-08 LAB — HEMOCCULT GUIAC POC 1CARD (OFFICE): Fecal Occult Bld: NEGATIVE

## 2010-10-08 LAB — CBC
MCHC: 34.2 g/dL (ref 30.0–36.0)
Platelets: 155 10*3/uL (ref 150–400)
RBC: 3.76 MIL/uL — ABNORMAL LOW (ref 3.87–5.11)

## 2010-10-08 LAB — URINALYSIS, ROUTINE W REFLEX MICROSCOPIC
Ketones, ur: NEGATIVE mg/dL
Protein, ur: NEGATIVE mg/dL
Urobilinogen, UA: 1 mg/dL (ref 0.0–1.0)

## 2010-10-08 LAB — DIFFERENTIAL
Eosinophils Absolute: 0.1 10*3/uL (ref 0.0–0.7)
Eosinophils Relative: 3 % (ref 0–5)
Lymphocytes Relative: 27 % (ref 12–46)
Lymphs Abs: 1 10*3/uL (ref 0.7–4.0)
Monocytes Absolute: 0.2 10*3/uL (ref 0.1–1.0)
Monocytes Relative: 4 % (ref 3–12)

## 2010-10-08 LAB — POCT URINALYSIS DIP (DEVICE)
Glucose, UA: NEGATIVE mg/dL
Hgb urine dipstick: NEGATIVE
Nitrite: NEGATIVE
Protein, ur: NEGATIVE mg/dL
Urobilinogen, UA: 0.2 mg/dL (ref 0.0–1.0)

## 2010-10-08 LAB — COMPREHENSIVE METABOLIC PANEL
ALT: 17 U/L (ref 0–35)
AST: 18 U/L (ref 0–37)
Albumin: 3.7 g/dL (ref 3.5–5.2)
CO2: 25 mEq/L (ref 19–32)
Calcium: 9.2 mg/dL (ref 8.4–10.5)
GFR calc Af Amer: >60 mL/min (ref >60)
GFR calc non Af Amer: >60 mL/min (ref >60)
Sodium: 141 mEq/L (ref 135–145)
Total Protein: 6.8 g/dL (ref 6.0–8.3)

## 2010-10-08 LAB — RPR: RPR Ser Ql: NONREACTIVE

## 2010-10-08 LAB — PREGNANCY, URINE: Preg Test, Ur: NEGATIVE

## 2010-10-08 LAB — GC/CHLAMYDIA PROBE AMP, GENITAL
Chlamydia, DNA Probe: NEGATIVE
GC Probe Amp, Genital: NEGATIVE

## 2010-10-08 LAB — WET PREP, GENITAL

## 2010-11-21 ENCOUNTER — Other Ambulatory Visit: Payer: Self-pay | Admitting: Oncology

## 2010-11-21 ENCOUNTER — Encounter (HOSPITAL_BASED_OUTPATIENT_CLINIC_OR_DEPARTMENT_OTHER): Payer: Self-pay | Admitting: Oncology

## 2010-11-21 DIAGNOSIS — D571 Sickle-cell disease without crisis: Secondary | ICD-10-CM

## 2010-11-21 LAB — CBC & DIFF AND RETIC
Basophils Absolute: 0 10*3/uL (ref 0.0–0.1)
EOS%: 1.6 % (ref 0.0–7.0)
Eosinophils Absolute: 0.1 10*3/uL (ref 0.0–0.5)
HCT: 29.2 % — ABNORMAL LOW (ref 34.8–46.6)
HGB: 10.2 g/dL — ABNORMAL LOW (ref 11.6–15.9)
Immature Retic Fract: 11.6 % — ABNORMAL HIGH (ref 0.00–10.70)
MCH: 26.7 pg (ref 25.1–34.0)
MCV: 76.3 fL — ABNORMAL LOW (ref 79.5–101.0)
MONO%: 5.6 % (ref 0.0–14.0)
NEUT#: 3 10*3/uL (ref 1.5–6.5)
NEUT%: 60.3 % (ref 38.4–76.8)
RDW: 19.3 % — ABNORMAL HIGH (ref 11.2–14.5)
Retic Ct Abs: 79.07 10*3/uL — ABNORMAL HIGH (ref 18.30–72.70)
lymph#: 1.6 10*3/uL (ref 0.9–3.3)

## 2011-03-28 LAB — DIFFERENTIAL
Eosinophils Relative: 4
Lymphocytes Relative: 30
Lymphs Abs: 1.6
Monocytes Absolute: 0.3
Monocytes Relative: 6
Neutro Abs: 3.2

## 2011-03-28 LAB — HEPATIC FUNCTION PANEL
ALT: 29
AST: 22
Alkaline Phosphatase: 76
Bilirubin, Direct: 0.3
Indirect Bilirubin: 2.1 — ABNORMAL HIGH
Total Bilirubin: 2.4 — ABNORMAL HIGH

## 2011-03-28 LAB — CBC
HCT: 30.8 — ABNORMAL LOW
Hemoglobin: 10.7 — ABNORMAL LOW
RBC: 4.01

## 2011-03-28 LAB — POCT I-STAT, CHEM 8
Calcium, Ion: 1.22
Creatinine, Ser: 1
Glucose, Bld: 96
HCT: 32 — ABNORMAL LOW
Hemoglobin: 10.9 — ABNORMAL LOW

## 2011-03-28 LAB — URINALYSIS, ROUTINE W REFLEX MICROSCOPIC
Bilirubin Urine: NEGATIVE
Hgb urine dipstick: NEGATIVE
Ketones, ur: 15 — AB
Nitrite: NEGATIVE
Protein, ur: NEGATIVE
Urobilinogen, UA: 1

## 2011-03-28 LAB — PREGNANCY, URINE: Preg Test, Ur: NEGATIVE

## 2011-03-28 LAB — LIPASE, BLOOD: Lipase: 25

## 2011-04-11 LAB — DIFFERENTIAL
Basophils Absolute: 0
Eosinophils Absolute: 0
Eosinophils Relative: 1
Lymphocytes Relative: 21
Lymphs Abs: 1
Neutrophils Relative %: 74

## 2011-04-11 LAB — BASIC METABOLIC PANEL
BUN: 8
Creatinine, Ser: 0.76
GFR calc non Af Amer: >60
Glucose, Bld: 113 — ABNORMAL HIGH
Potassium: 3.4 — ABNORMAL LOW

## 2011-04-11 LAB — CBC
HCT: 36.4
MCV: 77.4 — ABNORMAL LOW
Platelets: 172
RDW: 18 — ABNORMAL HIGH
WBC: 4.8

## 2011-04-11 LAB — RETICULOCYTES
RBC.: 4.71
Retic Ct Pct: 2.6

## 2011-05-07 ENCOUNTER — Other Ambulatory Visit (HOSPITAL_COMMUNITY): Payer: Self-pay | Admitting: Orthopedic Surgery

## 2011-05-07 DIAGNOSIS — M546 Pain in thoracic spine: Secondary | ICD-10-CM

## 2011-05-08 ENCOUNTER — Other Ambulatory Visit (HOSPITAL_COMMUNITY): Payer: Self-pay

## 2011-05-17 ENCOUNTER — Telehealth: Payer: Self-pay | Admitting: Oncology

## 2011-05-17 NOTE — Telephone Encounter (Signed)
Lvm advising 11/20 appt has been cx'd due to Epic. Advised in vm we will call back later to r/s.

## 2011-07-05 ENCOUNTER — Ambulatory Visit (HOSPITAL_COMMUNITY)
Admission: RE | Admit: 2011-07-05 | Discharge: 2011-07-05 | Disposition: A | Discharge status: Home / Self Care | Payer: Self-pay | Source: Ambulatory Visit | Attending: Orthopedic Surgery | Admitting: Orthopedic Surgery

## 2011-07-05 DIAGNOSIS — M546 Pain in thoracic spine: Secondary | ICD-10-CM | POA: Insufficient documentation

## 2011-07-23 ENCOUNTER — Ambulatory Visit: Payer: Self-pay | Admitting: Nurse Practitioner

## 2011-07-23 ENCOUNTER — Other Ambulatory Visit: Payer: Self-pay | Admitting: Lab

## 2012-06-10 ENCOUNTER — Telehealth (HOSPITAL_COMMUNITY): Payer: Self-pay

## 2012-06-10 ENCOUNTER — Encounter (HOSPITAL_COMMUNITY): Payer: Self-pay

## 2012-06-10 ENCOUNTER — Ambulatory Visit
Admission: RE | Admit: 2012-06-10 | Discharge: 2012-06-10 | Disposition: A | Discharge status: Home / Self Care | Payer: No Typology Code available for payment source | Source: Ambulatory Visit | Attending: Family Medicine | Admitting: Family Medicine

## 2012-06-10 ENCOUNTER — Emergency Department (INDEPENDENT_AMBULATORY_CARE_PROVIDER_SITE_OTHER)
Admission: EM | Admit: 2012-06-10 | Discharge: 2012-06-10 | Disposition: A | Discharge status: Home / Self Care | Payer: Self-pay | Source: Home / Self Care

## 2012-06-10 DIAGNOSIS — J209 Acute bronchitis, unspecified: Secondary | ICD-10-CM

## 2012-06-10 DIAGNOSIS — J309 Allergic rhinitis, unspecified: Secondary | ICD-10-CM

## 2012-06-10 DIAGNOSIS — D571 Sickle-cell disease without crisis: Secondary | ICD-10-CM

## 2012-06-10 DIAGNOSIS — M546 Pain in thoracic spine: Secondary | ICD-10-CM

## 2012-06-10 DIAGNOSIS — R05 Cough: Secondary | ICD-10-CM

## 2012-06-10 MED ORDER — DEXTROMETHORPHAN POLISTIREX 30 MG/5ML PO LQCR: 60.0000 mg | Freq: Two times a day (BID) | ORAL | Status: DC | PRN

## 2012-06-10 MED ORDER — ALBUTEROL SULFATE (5 MG/ML) 0.5% IN NEBU
INHALATION_SOLUTION | RESPIRATORY_TRACT | Status: AC
  Filled 2012-06-10: qty 1

## 2012-06-10 MED ORDER — AZITHROMYCIN 250 MG PO TABS: ORAL_TABLET | ORAL | Status: DC

## 2012-06-10 MED ORDER — ALBUTEROL SULFATE HFA 108 (90 BASE) MCG/ACT IN AERS: 2.0000 | INHALATION_SPRAY | Freq: Four times a day (QID) | RESPIRATORY_TRACT | Status: DC | PRN

## 2012-06-10 MED ORDER — IPRATROPIUM-ALBUTEROL 0.5-2.5 (3) MG/3ML IN SOLN
3.0000 mL | Freq: Once | RESPIRATORY_TRACT | Status: AC
  Administered 2012-06-10: 3 mL via RESPIRATORY_TRACT

## 2012-06-10 MED ORDER — CETIRIZINE HCL 10 MG PO TABS: 10.0000 mg | ORAL_TABLET | Freq: Every day | ORAL | Status: DC

## 2012-06-10 NOTE — Telephone Encounter (Signed)
Message copied by Lestine Mount on Tue Jun 10, 2012  3:43 PM ------      Message from: Cleora Fleet      Created: Tue Jun 10, 2012  2:44 PM      Regarding: Xray Results       Please notify patient that her chest xray came back OK and no infection was seen.              Rodney Langton, MD, CDE, FAAFP      [facility]      [facility]      [place], [place]

## 2012-06-10 NOTE — ED Notes (Signed)
C/o back pain for almost a month

## 2012-06-10 NOTE — ED Provider Notes (Signed)
History    CSN: [id number]  Arrival date & time 06/10/12  1143  Chief Complaint  Patient presents with  . Back Pain        cough  HPI Pt reports cough, wheezing and chest tightness for 3 weeks.  Says she has sickle cell anemia and had been told by Dr. August Saucer to come here and get a chest xray.   Pt denies chest pain.  No fever or chills.  Pt says she has no history of asthma.  She does have pollen allergies and has been suffering with them recently.    History reviewed. No pertinent past medical history.  History reviewed. No pertinent past surgical history.  No family history on file.  History  Substance Use Topics  . Smoking status: Not on file  . Smokeless tobacco: Not on file  . Alcohol Use: Not on file    OB History    Grav Para Term Preterm Abortions TAB SAB Ect Mult Living                 Review of Systems  Constitutional: Negative.   HENT: Negative.   Eyes: Negative.   Respiratory: Positive for cough, chest tightness, shortness of breath and wheezing.   Cardiovascular: Negative.   Gastrointestinal: Negative.   Musculoskeletal: Negative.   Neurological: Negative.   Hematological: Negative.   Psychiatric/Behavioral: Negative.     Allergies  Oxycodone and Vicodin  Home Medications  No current outpatient prescriptions on file.  BP 128/78  Pulse 72  Temp 98.2 F (36.8 C) (Oral)  Resp 20  SpO2 100%  Physical Exam  Nursing note and vitals reviewed. Constitutional: She is oriented to person, place, and time. She appears well-developed and well-nourished. No distress.  HENT:  Head: Normocephalic and atraumatic.  Neck: Normal range of motion. Neck supple.  Cardiovascular: Normal rate, regular rhythm and normal heart sounds.   Pulmonary/Chest: Effort normal. No respiratory distress. She has wheezes. She has rales. She exhibits no tenderness.  Abdominal: Soft. Bowel sounds are normal.  Musculoskeletal: Normal range of motion.  Neurological: She is alert  and oriented to person, place, and time.  Skin: Skin is warm and dry. No erythema.  Psychiatric: She has a normal mood and affect. Her behavior is normal. Judgment and thought content normal.    ED Course  Procedures (including critical care time)  Labs Reviewed - No data to display No results found.  No diagnosis found.  MDM  IMPRESSION  Allergic Rhinitis  Acute Bronchitis  Sickle Cell Anemia  Chronic Back Pain  RECOMMENDATIONS / PLAN  Nebulizer treatment given in office (duoneb) Pt received Flu vaccine this year - up to date Check CXR - 2 view Azithromycin Z pack - take as directed Albuterol HFA Delsym cough syrup prn Cetirizine daily  FOLLOW UP 1 week for recheck  The patient was given clear instructions to go to ER or return to medical center if symptoms don't improve, worsen or new problems develop.  The patient verbalized understanding.  The patient was told to call to get lab results if they haven't heard anything in the next week.            Cleora Fleet, MD 06/10/12 1446

## 2012-10-16 ENCOUNTER — Telehealth (HOSPITAL_COMMUNITY): Payer: Self-pay

## 2012-10-16 NOTE — Telephone Encounter (Signed)
Patient:  Kayla Woodard  MRN: [id number] Date Initiated: 10/08/2012      Documentation initiated by: Karoline Caldwell RN, BSN, BS  Subjective/Objective Assessment: 57 year old female, seen by Randa Evens, NP on 10/07/12 for physical exam. Patient needs to have mammogram per Randa Evens, NP request. After this CM spoke with patient, she indicated that she has not had a pap smear in over 10 years. This CM contacted patient to advise of programs available to with cost of getting pap & mammogram completed. Patient agreed and will pick up application at [facility] on 10/20/12.   Karoline Caldwell RN, BSN, [place] [phone]

## 2012-10-27 IMAGING — CR DG LUMBAR SPINE COMPLETE 4+V
6 series · 6 of 6 positions shown · non-contrast
Comparison: None

CLINICAL DATA: Back pain

LUMBAR SPINE - COMPLETE 4+ VIEW

[t l-spine a.p.]
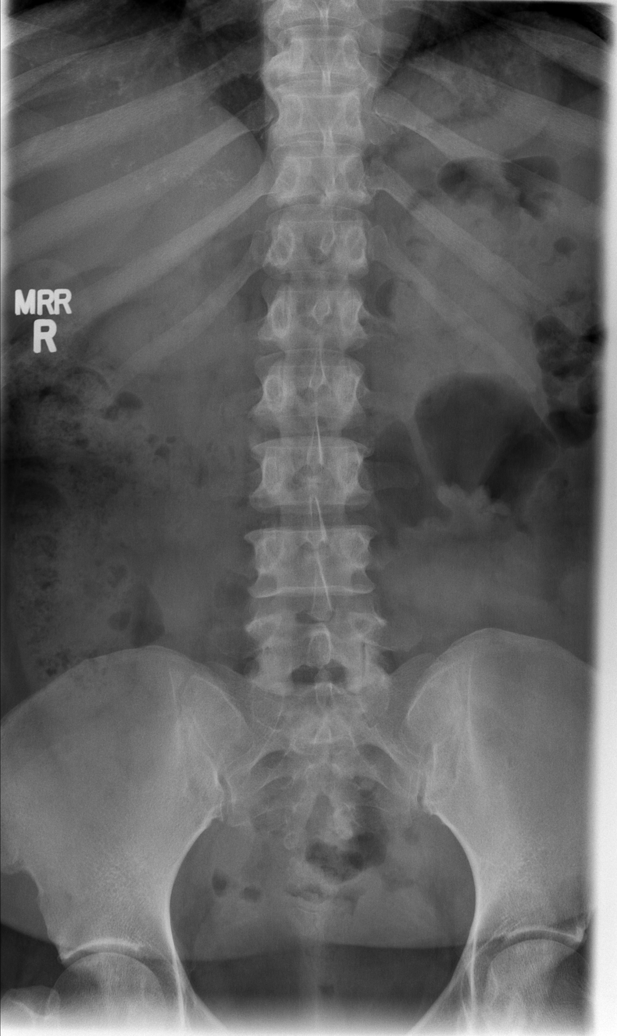

[t l-spine oblique exposure (1 of 3)]
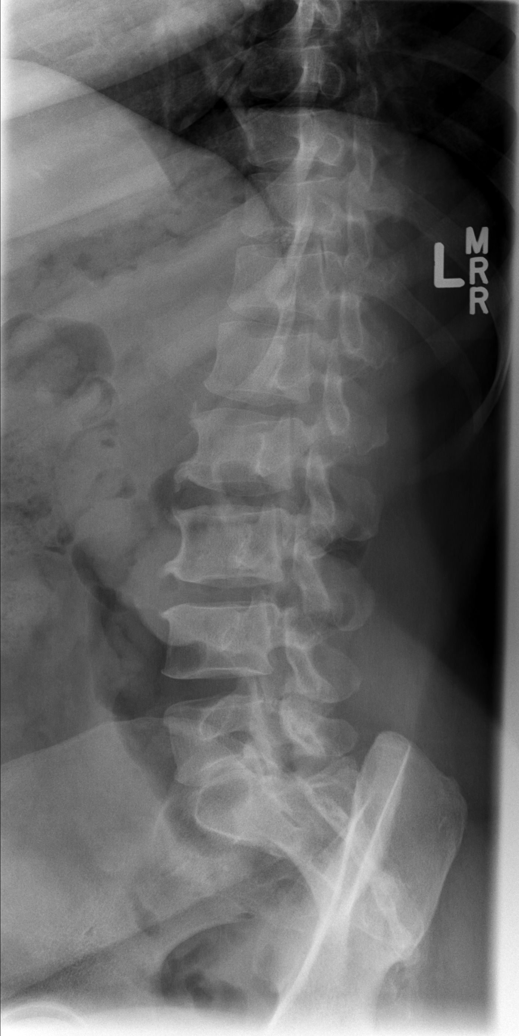

[t l-spine oblique exposure (2 of 3)]
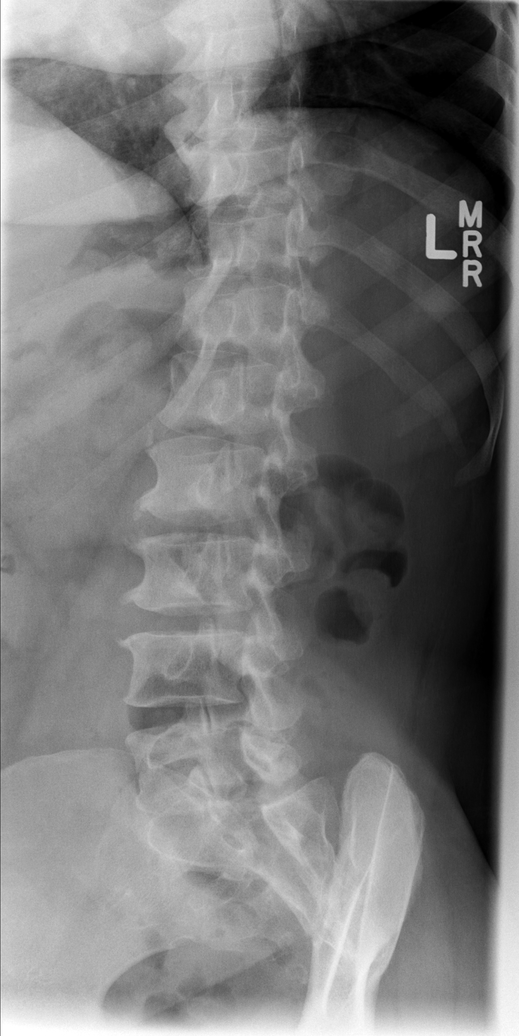

[t l-spine oblique exposure (3 of 3)]
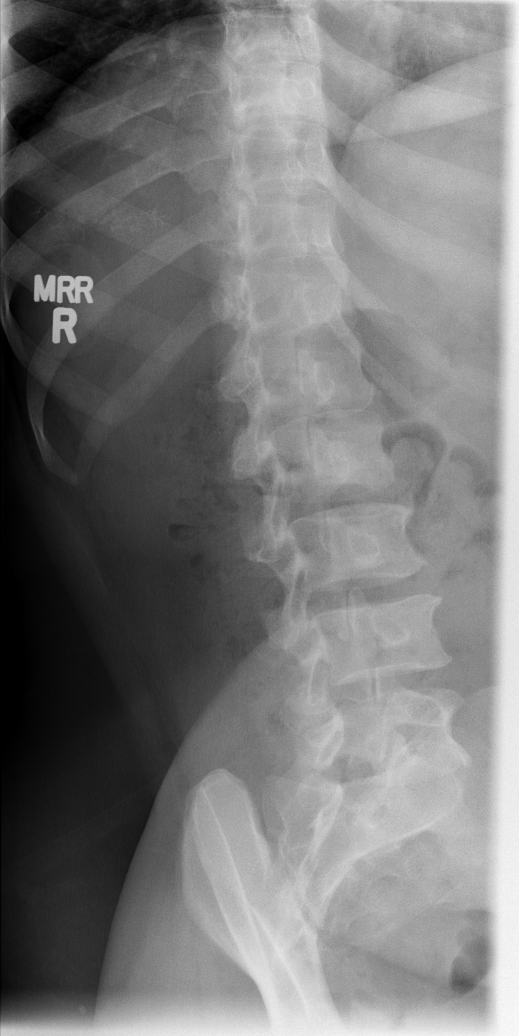

[t l-spine lat]
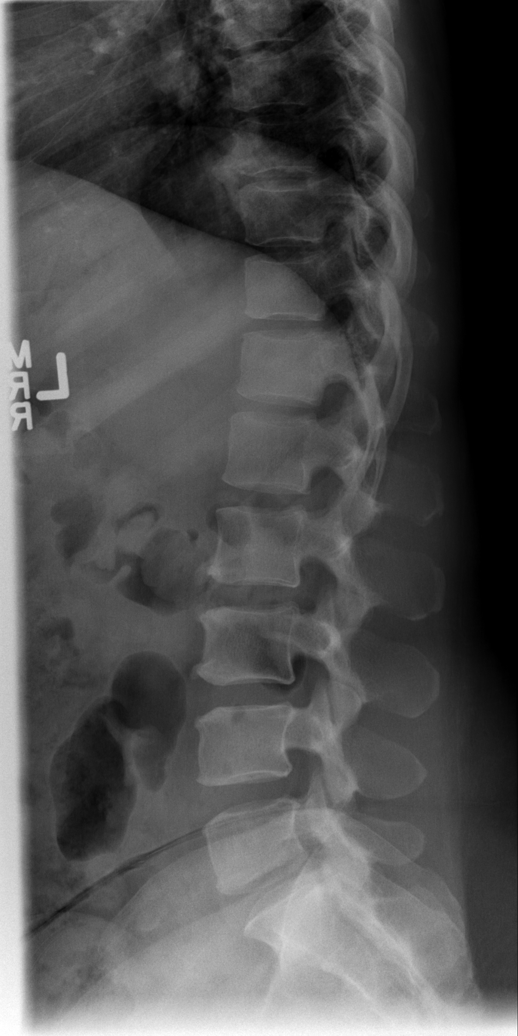

[t l-spine l5-s1 spot]
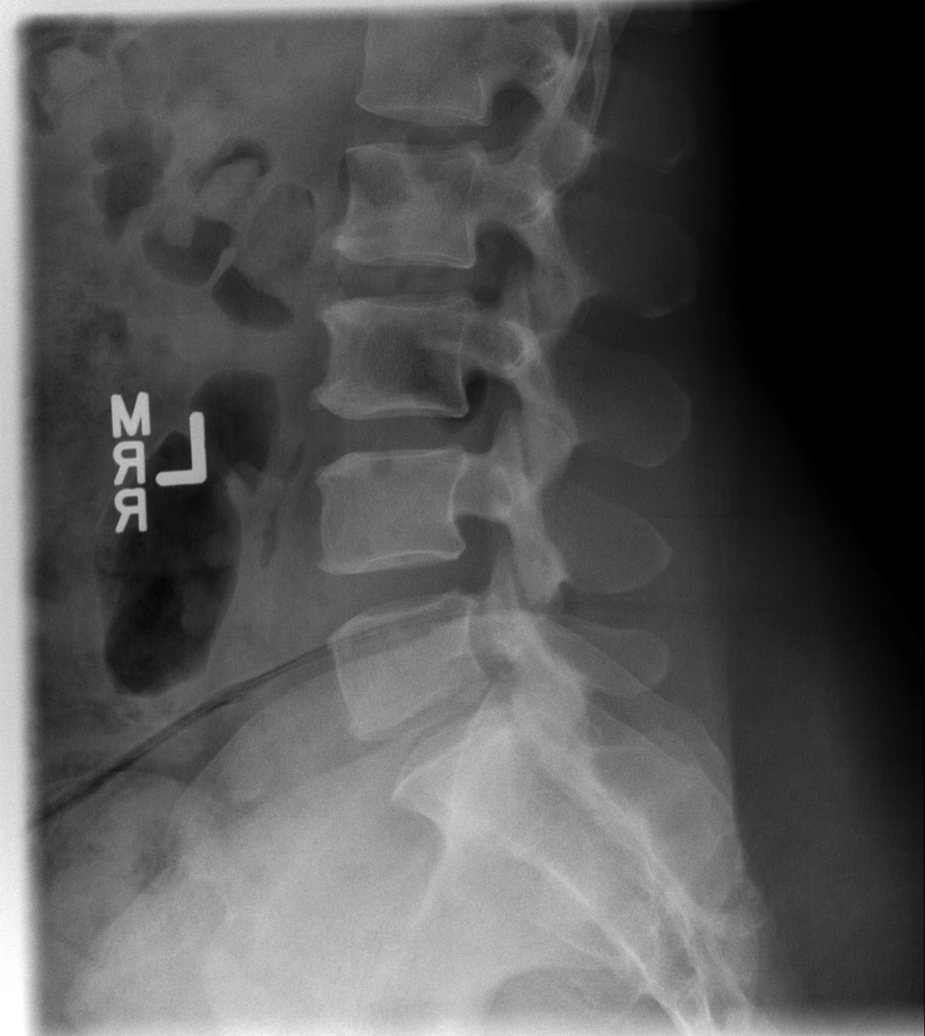

[6 of 6 positions shown; findings below may reference images not displayed]

FINDINGS: Negative for fracture.  Negative for pars defect.  Mild
anterior spurring in the disc space involving L2-3 and L3-4.  No
significant disc space narrowing.  No mass lesion.
IMPRESSION: No acute bony abnormality.

## 2012-11-07 ENCOUNTER — Encounter: Payer: Self-pay | Admitting: Internal Medicine

## 2012-11-12 ENCOUNTER — Encounter (HOSPITAL_COMMUNITY): Payer: Self-pay | Admitting: *Deleted

## 2012-11-12 ENCOUNTER — Emergency Department (HOSPITAL_COMMUNITY)
Admission: EM | Admit: 2012-11-12 | Discharge: 2012-11-12 | Disposition: A | Discharge status: Home / Self Care | Payer: No Typology Code available for payment source | Attending: Emergency Medicine | Admitting: Emergency Medicine

## 2012-11-12 ENCOUNTER — Emergency Department (HOSPITAL_COMMUNITY): Payer: No Typology Code available for payment source

## 2012-11-12 DIAGNOSIS — Z7982 Long term (current) use of aspirin: Secondary | ICD-10-CM | POA: Insufficient documentation

## 2012-11-12 DIAGNOSIS — G8929 Other chronic pain: Secondary | ICD-10-CM | POA: Insufficient documentation

## 2012-11-12 DIAGNOSIS — M549 Dorsalgia, unspecified: Secondary | ICD-10-CM | POA: Insufficient documentation

## 2012-11-12 DIAGNOSIS — M79609 Pain in unspecified limb: Secondary | ICD-10-CM | POA: Insufficient documentation

## 2012-11-12 DIAGNOSIS — D5700 Hb-SS disease with crisis, unspecified: Secondary | ICD-10-CM | POA: Insufficient documentation

## 2012-11-12 DIAGNOSIS — I10 Essential (primary) hypertension: Secondary | ICD-10-CM | POA: Insufficient documentation

## 2012-11-12 DIAGNOSIS — Z79899 Other long term (current) drug therapy: Secondary | ICD-10-CM | POA: Insufficient documentation

## 2012-11-12 DIAGNOSIS — R51 Headache: Secondary | ICD-10-CM | POA: Insufficient documentation

## 2012-11-12 DIAGNOSIS — R42 Dizziness and giddiness: Secondary | ICD-10-CM | POA: Insufficient documentation

## 2012-11-12 HISTORY — DX: Sickle-cell disease without crisis: D57.1

## 2012-11-12 LAB — URINALYSIS, ROUTINE W REFLEX MICROSCOPIC
Hgb urine dipstick: NEGATIVE
Nitrite: NEGATIVE
Protein, ur: NEGATIVE mg/dL
Urobilinogen, UA: 1 mg/dL (ref 0.0–1.0)

## 2012-11-12 LAB — CBC WITH DIFFERENTIAL/PLATELET
Basophils Relative: 1 % (ref 0–1)
Eosinophils Relative: 2 % (ref 0–5)
HCT: 29.1 % — ABNORMAL LOW (ref 36.0–46.0)
Hemoglobin: 10.7 g/dL — ABNORMAL LOW (ref 12.0–15.0)
Lymphs Abs: 1.6 10*3/uL (ref 0.7–4.0)
MCH: 24.9 pg — ABNORMAL LOW (ref 26.0–34.0)
MCV: 67.8 fL — ABNORMAL LOW (ref 78.0–100.0)
Monocytes Absolute: 0.2 10*3/uL (ref 0.1–1.0)
Monocytes Relative: 5 % (ref 3–12)
Neutro Abs: 2.3 10*3/uL (ref 1.7–7.7)
RBC: 4.29 MIL/uL (ref 3.87–5.11)
WBC: 4.2 10*3/uL (ref 4.0–10.5)

## 2012-11-12 LAB — COMPREHENSIVE METABOLIC PANEL
AST: 16 U/L (ref 0–37)
BUN: 8 mg/dL (ref 6–23)
CO2: 27 mEq/L (ref 19–32)
Chloride: 105 mEq/L (ref 96–112)
Creatinine, Ser: 0.85 mg/dL (ref 0.50–1.10)
GFR calc Af Amer: 88 mL/min — ABNORMAL LOW (ref >90)
GFR calc non Af Amer: 76 mL/min — ABNORMAL LOW (ref >90)
Glucose, Bld: 100 mg/dL — ABNORMAL HIGH (ref 70–99)
Total Bilirubin: 1.2 mg/dL (ref 0.3–1.2)

## 2012-11-12 MED ORDER — MECLIZINE HCL 50 MG PO TABS: 50.0000 mg | ORAL_TABLET | Freq: Three times a day (TID) | ORAL | Status: DC | PRN

## 2012-11-12 MED ORDER — MECLIZINE HCL 25 MG PO TABS
25.0000 mg | ORAL_TABLET | Freq: Once | ORAL | Status: AC
  Administered 2012-11-12: 25 mg via ORAL
  Filled 2012-11-12: qty 1

## 2012-11-12 MED ORDER — HYDROMORPHONE HCL 2 MG PO TABS: 1.0000 mg | ORAL_TABLET | ORAL | Status: DC | PRN

## 2012-11-12 MED ORDER — MORPHINE SULFATE 4 MG/ML IJ SOLN
8.0000 mg | Freq: Once | INTRAMUSCULAR | Status: AC
  Administered 2012-11-12: 8 mg via INTRAVENOUS
  Filled 2012-11-12: qty 2

## 2012-11-12 MED ORDER — SODIUM CHLORIDE 0.9 % IV SOLN
Freq: Once | INTRAVENOUS | Status: AC
  Administered 2012-11-12: 11:00:00 via INTRAVENOUS

## 2012-11-12 MED ORDER — ONDANSETRON HCL 4 MG/2ML IJ SOLN
4.0000 mg | Freq: Once | INTRAMUSCULAR | Status: AC
  Administered 2012-11-12: 4 mg via INTRAVENOUS
  Filled 2012-11-12: qty 2

## 2012-11-12 NOTE — ED Provider Notes (Signed)
History     CSN: [id number]  Arrival date & time 11/12/12  1308   First MD Initiated Contact with Patient 11/12/12 1007      Chief Complaint  Patient presents with  . Sickle Cell Pain Crisis    (Consider location/radiation/quality/duration/timing/severity/associated sxs/prior treatment) HPI  57 year old female with a past medical history of [place] sickle cell anemia presents to the emergency department with chief complaint of dizziness and sickle cell pain crisis.  The patient has a history of chronic back pain however her pain has become severe over the past day.  She states that this is normal when she has her pain crises, which are infrequent.  She takes tramadol at home for her normal pain.  Patient also complains of pain in her arms and legs.  She denies any chest pain, shortness of breath, abdominal pain, nausea or vomiting.  Patient denies any urinary symptoms or vaginal symptoms.  She does have a headache and states she has been feeling very dizzy for the past 3 weeks.  She states she feels that the room is spinning but also feels lightheaded and like her "blood is low."  She denies any strokelike symptoms except for the vertigo or symptoms of TIA.  Past Medical History  Diagnosis Date  . Anemia   . Back pain, chronic   . Hypertension   . Sickle cell anemia     History reviewed. No pertinent past surgical history.  History reviewed. No pertinent family history.  History  Substance Use Topics  . Smoking status: Not on file  . Smokeless tobacco: Not on file  . Alcohol Use: No    OB History   Grav Para Term Preterm Abortions TAB SAB Ect Mult Living                  Review of Systems Ten systems reviewed and are negative for acute change, except as noted in the HPI.   Allergies  Oxycodone and Vicodin  Home Medications   Current Outpatient Rx  Name  Route  Sig  Dispense  Refill  . aspirin EC 81 MG tablet   Oral   Take 81 mg by mouth daily.         .  cetirizine (ZYRTEC) 10 MG tablet   Oral   Take 10 mg by mouth daily.         Marland Kitchen losartan (COZAAR) 100 MG tablet   Oral   Take 100 mg by mouth daily.         . potassium chloride (K-DUR) 10 MEQ tablet   Oral   Take 10 mEq by mouth 2 (two) times daily.         . traMADol (ULTRAM) 50 MG tablet   Oral   Take 50 mg by mouth 3 (three) times daily.         . vitamin C (ASCORBIC ACID) 500 MG tablet   Oral   Take 500 mg by mouth daily.           BP 139/88  Pulse 80  Temp(Src) 98.2 F (36.8 C) (Oral)  Resp 18  SpO2 97%  Physical Exam Physical Exam  Nursing note and vitals reviewed. Constitutional: She is oriented to person, place, and time. She appears well-developed and well-nourished. No distress.  HENT:  Head: Normocephalic and atraumatic.  Eyes: Conjunctivae normal and EOM are normal. Pupils are equal, round, and reactive to light. No scleral icterus.  Neck: Normal range of motion.  Cardiovascular: Normal  rate, regular rhythm and normal heart sounds.  Exam reveals no gallop and no friction rub.   No murmur heard. Pulmonary/Chest: Effort normal and breath sounds normal. No respiratory distress.  Abdominal: Soft. Bowel sounds are normal. She exhibits no distension and no mass. There is no tenderness. There is no guarding.  Neurological: She is alert and oriented to person, place, and time. Speech is clear and goal oriented, follows commands Major Cranial nerves without deficit, no facial droop Normal strength in upper and lower extremities bilaterally including dorsiflexion and plantar flexion, strong and equal grip strength Sensation normal to light and sharp touch Moves extremities without ataxia, coordination intact Normal finger to nose  Skin: Skin is warm and dry. She is not diaphoretic.    ED Course  Procedures (including critical care time)  Labs Reviewed  CBC WITH DIFFERENTIAL  COMPREHENSIVE METABOLIC PANEL  URINALYSIS, ROUTINE W REFLEX MICROSCOPIC    No results found.   No diagnosis found.    MDM  10:30 AM Filed Vitals:   11/12/12 0903  BP: 139/88  Pulse: 80  Temp: 98.2 F (36.8 C)  Resp: 18   Patient with complaint of sickle cell pain crisis.  We'll check her labs.  We'll exclude urinary tract infection.  I will obtain a CT of the head to rule out cerebellar stroke, however patient is neurologically intact and without focal neurologic abnormality.  Pain control initiated with morphine.  Patient states this works well for her.   Patient seen in shared visit with Dr. Manus Gunning. CT negative. Bilirubin is normal and she does not appear to be having sickle cell crisis.  No concern for stroke. No nystagmus or focal neuro deficits. Patient will be d/c with pain meds, and antivert. F/u with pcp. The patient appears reasonably screened and/or stabilized for discharge and I doubt any other medical condition or other [facility] requiring further screening, evaluation, or treatment in the ED at this time prior to discharge.      Arthor Captain, PA-C 11/12/12 1521

## 2012-11-12 NOTE — ED Notes (Signed)
Reports sickle cell crisis, pain all over her body and dizziness. Reports that she is always dizzy and has pain but its more severe today. No acute distress noted at this time, airway intact.

## 2012-11-12 NOTE — ED Provider Notes (Signed)
Medical screening examination/treatment/procedure(s) were conducted as a shared visit with non-physician practitioner(s) and myself.  I personally evaluated the patient during the encounter  Mid back pain similar to previous sickle cell pain. No fever, cough, chest pain, SOB. Several month history of poorly described "dizziness".  Denies vertigo.  Endorses lightheadedness.  CN 2-12 intact, no nystagmus, 5/5 strength throughout, no ataxia on finger to nose. Test of skew negative, head impulse testing negative. Normal gait.  Glynn Octave, MD 11/12/12 1911

## 2012-11-14 ENCOUNTER — Telehealth: Payer: Self-pay | Admitting: Internal Medicine

## 2012-11-14 DIAGNOSIS — M549 Dorsalgia, unspecified: Secondary | ICD-10-CM

## 2012-11-14 DIAGNOSIS — I10 Essential (primary) hypertension: Secondary | ICD-10-CM

## 2012-11-14 DIAGNOSIS — D571 Sickle-cell disease without crisis: Secondary | ICD-10-CM

## 2012-11-14 NOTE — Telephone Encounter (Signed)
Losartan 100mg   Tramadol 50mg   Call in to new pharmacy. [facility]

## 2012-11-17 MED ORDER — LOSARTAN POTASSIUM 100 MG PO TABS: 100.0000 mg | ORAL_TABLET | Freq: Every day | ORAL | Status: DC

## 2012-11-17 MED ORDER — TRAMADOL HCL 50 MG PO TABS: 50.0000 mg | ORAL_TABLET | Freq: Three times a day (TID) | ORAL | Status: DC

## 2012-11-17 NOTE — Telephone Encounter (Signed)
Per Gwinda Passe refille these meds for 30 day supply and 3 refills, per Marcelino Duster pt does not have a true allergy to oxycodone as she has been on Tramadol for a while now.

## 2012-11-18 ENCOUNTER — Telehealth: Payer: Self-pay | Admitting: Occupational Therapy

## 2012-11-18 NOTE — Telephone Encounter (Signed)
Patient called to verify phar had info.

## 2012-11-27 ENCOUNTER — Other Ambulatory Visit (HOSPITAL_COMMUNITY): Payer: Self-pay | Admitting: Internal Medicine

## 2012-11-27 DIAGNOSIS — Z1231 Encounter for screening mammogram for malignant neoplasm of breast: Secondary | ICD-10-CM

## 2012-11-28 ENCOUNTER — Telehealth: Payer: Self-pay

## 2012-11-28 NOTE — Telephone Encounter (Signed)
Case Management Note: This CM returned call to Ms.Lenzo. She called to get an appointment for swollen eyes then stated she needs new glasses and refills. Ms.Heider states she still has her orange card for her insurance coverage. This CM will call Ms.Sinha back once a provider is located to refer her to. Then Ms.Eller stated she will call Maxine Glenn at [facility].   Karoline Caldwell RN, BSN, [place] [phone]

## 2012-12-15 ENCOUNTER — Ambulatory Visit (HOSPITAL_COMMUNITY)
Admission: RE | Admit: 2012-12-15 | Discharge: 2012-12-15 | Disposition: A | Discharge status: Home / Self Care | Payer: No Typology Code available for payment source | Source: Ambulatory Visit | Attending: Internal Medicine | Admitting: Internal Medicine

## 2012-12-15 DIAGNOSIS — Z1231 Encounter for screening mammogram for malignant neoplasm of breast: Secondary | ICD-10-CM | POA: Insufficient documentation

## 2013-01-27 ENCOUNTER — Ambulatory Visit: Payer: No Typology Code available for payment source | Admitting: Internal Medicine

## 2013-01-28 ENCOUNTER — Ambulatory Visit: Payer: No Typology Code available for payment source | Admitting: Internal Medicine

## 2013-02-02 ENCOUNTER — Ambulatory Visit: Payer: No Typology Code available for payment source | Admitting: Internal Medicine

## 2013-03-10 ENCOUNTER — Other Ambulatory Visit: Payer: Self-pay | Admitting: Primary Care

## 2013-03-10 NOTE — Telephone Encounter (Signed)
Pt needs to establish care previously seen by Dr. August Saucer. No refills until seen.

## 2013-03-18 ENCOUNTER — Ambulatory Visit: Payer: No Typology Code available for payment source

## 2013-03-24 ENCOUNTER — Ambulatory Visit: Payer: No Typology Code available for payment source

## 2013-03-25 ENCOUNTER — Ambulatory Visit: Payer: No Typology Code available for payment source | Attending: Internal Medicine | Admitting: Internal Medicine

## 2013-03-25 ENCOUNTER — Ambulatory Visit: Payer: No Typology Code available for payment source | Admitting: Internal Medicine

## 2013-03-25 VITALS — BP 154/89 | HR 73 | Temp 98.2°F | Resp 16 | Wt 167.8 lb

## 2013-03-25 DIAGNOSIS — M549 Dorsalgia, unspecified: Secondary | ICD-10-CM

## 2013-03-25 DIAGNOSIS — R5381 Other malaise: Secondary | ICD-10-CM | POA: Insufficient documentation

## 2013-03-25 DIAGNOSIS — D57819 Other sickle-cell disorders with crisis, unspecified: Secondary | ICD-10-CM

## 2013-03-25 DIAGNOSIS — K3189 Other diseases of stomach and duodenum: Secondary | ICD-10-CM | POA: Insufficient documentation

## 2013-03-25 DIAGNOSIS — Z7982 Long term (current) use of aspirin: Secondary | ICD-10-CM | POA: Insufficient documentation

## 2013-03-25 DIAGNOSIS — Z79899 Other long term (current) drug therapy: Secondary | ICD-10-CM | POA: Insufficient documentation

## 2013-03-25 DIAGNOSIS — I10 Essential (primary) hypertension: Secondary | ICD-10-CM | POA: Insufficient documentation

## 2013-03-25 DIAGNOSIS — D571 Sickle-cell disease without crisis: Secondary | ICD-10-CM | POA: Insufficient documentation

## 2013-03-25 LAB — CBC WITH DIFFERENTIAL/PLATELET
Basophils Absolute: 0 10*3/uL (ref 0.0–0.1)
Basophils Relative: 0 % (ref 0–1)
Eosinophils Absolute: 0.1 10*3/uL (ref 0.0–0.7)
Eosinophils Relative: 2 % (ref 0–5)
HCT: 33.3 % — ABNORMAL LOW (ref 36.0–46.0)
Hemoglobin: 11.5 g/dL — ABNORMAL LOW (ref 12.0–15.0)
MCH: 25.4 pg — ABNORMAL LOW (ref 26.0–34.0)
MCHC: 34.5 g/dL (ref 30.0–36.0)
Monocytes Absolute: 0.4 10*3/uL (ref 0.1–1.0)
Monocytes Relative: 7 % (ref 3–12)
Neutro Abs: 3.1 10*3/uL (ref 1.7–7.7)
RDW: 19.3 % — ABNORMAL HIGH (ref 11.5–15.5)

## 2013-03-25 LAB — COMPREHENSIVE METABOLIC PANEL
AST: 12 U/L (ref 0–37)
Albumin: 4.3 g/dL (ref 3.5–5.2)
BUN: 10 mg/dL (ref 6–23)
CO2: 28 mEq/L (ref 19–32)
Calcium: 9.1 mg/dL (ref 8.4–10.5)
Chloride: 107 mEq/L (ref 96–112)
Creat: 0.73 mg/dL (ref 0.50–1.10)
Glucose, Bld: 108 mg/dL — ABNORMAL HIGH (ref 70–99)
Potassium: 4.1 mEq/L (ref 3.5–5.3)

## 2013-03-25 MED ORDER — TRAMADOL HCL 50 MG PO TABS: 50.0000 mg | ORAL_TABLET | Freq: Three times a day (TID) | ORAL | Status: DC

## 2013-03-25 MED ORDER — ONDANSETRON HCL 4 MG PO TABS: 4.0000 mg | ORAL_TABLET | Freq: Three times a day (TID) | ORAL | Status: DC | PRN

## 2013-03-25 MED ORDER — METRONIDAZOLE 500 MG PO TABS: 500.0000 mg | ORAL_TABLET | Freq: Three times a day (TID) | ORAL | Status: DC

## 2013-03-25 MED ORDER — HYDROMORPHONE HCL 2 MG PO TABS: 1.0000 mg | ORAL_TABLET | ORAL | Status: DC | PRN

## 2013-03-25 MED ORDER — LOSARTAN POTASSIUM 100 MG PO TABS: 100.0000 mg | ORAL_TABLET | Freq: Every day | ORAL | Status: DC

## 2013-03-25 NOTE — Progress Notes (Signed)
Patient has history of sickle cell anemia Having pain in her joints Needs her blood pressure medication refilled and her tramadol

## 2013-03-25 NOTE — Progress Notes (Signed)
Patient ID: Kayla Woodard, female   DOB: February 21, 1956, 57 y.o.   MRN: [id number]   CC: stomach ache    [id number] is 57 year old female who presents to clinic with main concern of one week duration of stomach ache that is persistent and throbbing in nature, 5/10 in severity, associated with generalized weakness, subjective fevers and chills and per patient similar to her typical sickle cell crisis. Patient explains that she is able to drink fluids and eat regular food with no specific intolerance. She feels that she can stay at home and get through this crisis conservatively.  Allergies  Allergen Reactions  . Oxycodone   . Vicodin [Hydrocodone-Acetaminophen]    Past Medical History  Diagnosis Date  . Anemia   . Back pain, chronic   . Hypertension   . Sickle cell anemia    Current Outpatient Prescriptions on File Prior to Visit  Medication Sig Dispense Refill  . aspirin EC 81 MG tablet Take 81 mg by mouth daily.      . cetirizine (ZYRTEC) 10 MG tablet Take 10 mg by mouth daily.      . meclizine (ANTIVERT) 50 MG tablet Take 1 tablet (50 mg total) by mouth 3 (three) times daily as needed.  30 tablet  0  . potassium chloride (K-DUR) 10 MEQ tablet Take 10 mEq by mouth 2 (two) times daily.      . vitamin C (ASCORBIC ACID) 500 MG tablet Take 500 mg by mouth daily.       No current facility-administered medications on file prior to visit.   No family history of cancers History   Social History  . Marital Status: Married    Spouse Name: N/A    Number of Children: N/A  . Years of Education: N/A   Occupational History  . Not on file.   Social History Main Topics  . Smoking status: Not on file  . Smokeless tobacco: Not on file  . Alcohol Use: No  . Drug Use: No  . Sexual Activity: Not on file   Other Topics Concern  . Not on file   Social History Narrative  . No narrative on file    Review of Systems  Constitutional: Subjective fevers and chills present HENT: Negative for  ear pain, nosebleeds, congestion, facial swelling, rhinorrhea, neck pain, neck stiffness and ear discharge.   Eyes: Negative for pain, discharge, redness, itching and visual disturbance.  Respiratory: Negative for cough, choking, chest tightness, shortness of breath, wheezing and stridor.   Cardiovascular: Negative for chest pain, palpitations and leg swelling.  Gastrointestinal: Negative for abdominal distention.  Genitourinary: Negative for dysuria, urgency, frequency, hematuria, flank pain, decreased urine volume, difficulty urinating and dyspareunia.  Musculoskeletal: Negative for back pain, joint swelling, arthralgias and gait problem.  Neurological: Negative for dizziness, tremors, seizures, syncope, facial asymmetry, speech difficulty, weakness, light-headedness, numbness and headaches.  Hematological: Negative for adenopathy. Does not bruise/bleed easily.  Psychiatric/Behavioral: Negative for hallucinations, behavioral problems, confusion, dysphoric mood, decreased concentration and agitation.    Objective:   Filed Vitals:   03/25/13 1423  BP: 154/89  Pulse: 73  Temp: 98.2 F (36.8 C)  Resp: 16    Physical Exam  Constitutional: Appears well-developed and well-nourished. No distress.  CVS: RRR, S1/S2 +, no murmurs, no gallops, no carotid bruit.  Pulmonary: Effort and breath sounds normal, no stridor, rhonchi, wheezes, rales.  Abdominal: Soft. BS +,  no distension, tenderness, rebound or guarding.    Lab Results  Component Value Date  WBC 4.2 11/12/2012   HGB 10.7* 11/12/2012   HCT 29.1* 11/12/2012   MCV 67.8* 11/12/2012   PLT 144* 11/12/2012   Lab Results  Component Value Date   CREATININE 0.85 11/12/2012   BUN 8 11/12/2012   NA 140 11/12/2012   K 3.7 11/12/2012   CL 105 11/12/2012   CO2 27 11/12/2012    No results found for this basename: HGBA1C   Lipid Panel  No results found for this basename: chol, trig, hdl, cholhdl, vldl, ldlcalc       Assessment and plan:    Patient Active Problem List   Diagnosis Date Noted  . SICKLE-CELL DISEASE, UNSPECIFIED - possible early flare of sickle cell crisis. Will check CBC and reticulocyte number. We'll also check electrolyte panel to make sure that it is stable and to make sure that no electrolyte need to be replaced. I have advised continuation of fluid intake, analgesia and antiemetics as needed. Patient advised to go to the hospital if his symptoms do not improve or get worse  03/21/2009

## 2013-03-25 NOTE — Patient Instructions (Signed)
Sickle Cell Anemia  Sickle cell anemia needs regular medical care by your caregiver and awareness by you when to seek medical care. Pain is a common problem in children with sickle cell disease. This usually starts at less than 55 year of age. Pain can occur nearly anywhere in the body, but most commonly happens in the extremities, back, chest, or belly (abdomen). Pain episodes can start suddenly or may follow an illness. These attacks can appear as decreased activity, loss of appetite, change in behavior, or simply complaints of pain.  DIAGNOSIS    Specialized blood and gene testing can help make this diagnosis early in the disease. Blood tests may then be done to watch blood levels.   Specialized brain scans are done when there are problems in the brain during a crisis.   Lung testing may be done later in the disease.  HOME CARE INSTRUCTIONS    Maintain good hydration. Increase your child's fluid intake in hot weather and during exercise.   Avoid smoking around your child. Smoking lowers the oxygen in the blood and can cause sickling.   Control pain. Only give your child over-the-counter or prescription medicines for pain, discomfort, or fever as directed by their caregiver. Do not give aspirin to children because of the association with Reye's syndrome.   Keep regular health care checks to keep a proper red blood cell (hemoglobin) level. A moderate anemia level protects against sickling crises.   You or your child should receive all the same immunizations and care as the people around them.   Moms should breastfeed their babies if possible. Use formulas with iron added if breastfeeding is not possible. Additional iron should not be given unless there is a lack of it. People with SCD build up iron faster than normal. Give folic acid and additional vitamins as directed.   If you or your child has been prescribed antibiotics or other medications to prevent problems, give them as directed.   Summer camps  are available for children with SCD. They may help young people deal with their disease. The camps introduce them to other children with the same condition.   Young people with SCD may become frustrated or angry at their disease. This can cause rebellion and refusal to follow medical care. Help groups or counseling may help with these problems.   Make sure your child wears a medical alert bracelet. When traveling, keep your medical information, caregiver's names, and the medications your child takes with you at all times.  SEEK IMMEDIATE MEDICAL CARE IF:    You or your child feels dizzy or faint.   You or your child develops a new onset of abdominal pain, especially on the left side near the stomach area.   You or your child develops a persistent, often uncomfortable and painful penile erection. This is called priapism. Always check young boys for this. It is often embarrassing for them and they may not bring it to your attention. This is a medical emergency and needs immediate treatment. If this is not treated it will lead to impotence.   You or your child develops numbness in or has a hard time moving arms and legs.   You or your child has a hard time with speech.   You have a fever.   You or your child develops signs of infection (chills, lethargy, irritability, poor eating, vomiting). The younger the child, the more you should be concerned.   With fevers, do not give medications to lower   the fever right away. This could cover up a problem that is developing. Notify your caregiver.   You or your child develops pain that is not helped with medicine.   You or your child develops shortness of breath, or is coughing up pus-like or bloody sputum.   You or your child develops any problems that are new and are causing you to worry.  Document Released: 09/26/2005 Document Revised: 09/10/2011 Document Reviewed: 11/16/2009  [employer] Patient Information 2014 [employer].

## 2013-07-17 ENCOUNTER — Telehealth: Payer: Self-pay | Admitting: Internal Medicine

## 2013-07-17 NOTE — Telephone Encounter (Signed)
Called to schedule annual physical. No answer.

## 2013-07-29 ENCOUNTER — Encounter (HOSPITAL_COMMUNITY): Payer: Self-pay | Admitting: Emergency Medicine

## 2013-07-29 ENCOUNTER — Emergency Department (INDEPENDENT_AMBULATORY_CARE_PROVIDER_SITE_OTHER)
Admission: EM | Admit: 2013-07-29 | Discharge: 2013-07-29 | Disposition: A | Discharge status: Home / Self Care | Payer: Self-pay | Source: Home / Self Care | Attending: Family Medicine | Admitting: Family Medicine

## 2013-07-29 DIAGNOSIS — K3189 Other diseases of stomach and duodenum: Secondary | ICD-10-CM

## 2013-07-29 DIAGNOSIS — R1013 Epigastric pain: Secondary | ICD-10-CM

## 2013-07-29 MED ORDER — OMEPRAZOLE 40 MG PO CPDR: 40.0000 mg | DELAYED_RELEASE_CAPSULE | Freq: Every day | ORAL | Status: DC

## 2013-07-29 NOTE — ED Notes (Signed)
C/o stomach pain Denies any bowel problems and urinary problems States she has had theses sx before and was dx with bacteria in stomach Was given antibiotic and felt better

## 2013-07-29 NOTE — ED Provider Notes (Signed)
Gerlean Renkouele Haen is a 58 y.o. female who presents to Urgent Care today for abdominal pain and discomfort. This is been present for about 4 months. Patient has mild abdominal discomfort associated with burping. She notes the symptoms were similar to several years ago when she was diagnosed with H. pylori based on a urease breath test. She was treated with some unknown antibiotics and got better. She notes her symptoms have been slowly returning. She denies any nausea vomiting or diarrhea. She denies any urinary symptoms. Her symptoms are mild and not very bothersome.   Past Medical History  Diagnosis Date  . Anemia   . Back pain, chronic   . Hypertension   . Sickle cell anemia    History  Substance Use Topics  . Smoking status: Not on file  . Smokeless tobacco: Not on file  . Alcohol Use: No   ROS as above Medications: No current facility-administered medications for this encounter.   Current Outpatient Prescriptions  Medication Sig Dispense Refill  . aspirin EC 81 MG tablet Take 81 mg by mouth daily.      . cetirizine (ZYRTEC) 10 MG tablet Take 10 mg by mouth daily.      Marland Kitchen. HYDROmorphone (DILAUDID) 2 MG tablet Take 0.5-1 tablets (1-2 mg total) by mouth every 4 (four) hours as needed for pain.  20 tablet  0  . losartan (COZAAR) 100 MG tablet Take 1 tablet (100 mg total) by mouth daily.  30 tablet  3  . meclizine (ANTIVERT) 50 MG tablet Take 1 tablet (50 mg total) by mouth 3 (three) times daily as needed.  30 tablet  0  . metroNIDAZOLE (FLAGYL) 500 MG tablet Take 1 tablet (500 mg total) by mouth 3 (three) times daily.  21 tablet  0  . omeprazole (PRILOSEC) 40 MG capsule Take 1 capsule (40 mg total) by mouth daily.  30 capsule  1  . ondansetron (ZOFRAN) 4 MG tablet Take 1 tablet (4 mg total) by mouth every 8 (eight) hours as needed for nausea.  20 tablet  0  . potassium chloride (K-DUR) 10 MEQ tablet Take 10 mEq by mouth 2 (two) times daily.      . traMADol (ULTRAM) 50 MG tablet Take 1  tablet (50 mg total) by mouth 3 (three) times daily.  90 tablet  3  . vitamin C (ASCORBIC ACID) 500 MG tablet Take 500 mg by mouth daily.        Exam:  BP 123/80  Pulse 68  Temp(Src) 97.5 F (36.4 C) (Oral)  Resp 18  SpO2 100% Gen: Well NAD HEENT: EOMI,  MMM Lungs: Normal work of breathing. CTABL Heart: RRR no MRG Abd: NABS, Soft. NT, ND Exts: Brisk capillary refill, warm and well perfused.    Assessment and Plan: 58 y.o. female with dyspepsia. Plan to treat with omeprazole for one month. Patient will followup with her primary care provider for repeat testing. She may benefit from urease breath test again in the future.  Discussed warning signs or symptoms. Please see discharge instructions. Patient expresses understanding.    Rodolph BongEvan S Oluwaseun Bruyere, MD 07/29/13 [phone]

## 2013-07-29 NOTE — Discharge Instructions (Signed)
Thank you for coming in today. Take omeprazole daily for one month. Followup with your primary care provider soon.  If your belly pain worsens, or you have high fever, bad vomiting, blood in your stool or black tarry stool go to the Emergency Room.   Indigestion Indigestion is discomfort in the upper abdomen that is caused by underlying problems such as gastroesophageal reflux disease (GERD), ulcers, or gallbladder problems.  CAUSES  Indigestion can be caused by many things. Possible causes include:  Stomach acid in the esophagus.  Stomach infections, usually caused by the bacteria H. pylori.  Being overweight.  Hiatal hernia. This means part of the stomach pushes up through the diaphragm.  Overeating.  Emotional problems, such as stress, anxiety, or depression.  Poor nutrition.  Consuming too much alcohol, tobacco, or caffeine.  Consuming spicy foods, fats, peppermint, chocolate, tomato products, citrus, or fruit juices.  Medicines such as aspirin and other anti-inflammatory drugs, hormones, steroids, and thyroid medicines.  Gastroparesis. This is a condition in which the stomach does not empty properly.  Stomach cancer.  Pregnancy, due to an increase in hormone levels, a relaxation of muscles in the digestive tract, and pressure on the stomach from the growing fetus. SYMPTOMS   Uncomfortable feeling of fullness after eating.  Pain or burning sensation in the upper abdomen.  Bloating.  Belching and gas.  Nausea and vomiting.  Acidic taste in the mouth.  Burning sensation in the chest (heartburn). DIAGNOSIS  Your caregiver will review your medical history and perform a physical exam. Other tests, such as blood tests, stool tests, X-rays, and other imaging scans, may be done to check for more serious problems. TREATMENT  Liquid antacids and other drugs may be given to block stomach acid secretion. Medicines that increase esophageal muscle tone may also be given to  help reduce symptoms. If an infection is found, antibiotic medicine may be given. HOME CARE INSTRUCTIONS  Avoid foods and drinks that make your symptoms worse, such as:  Caffeine or alcoholic drinks.  Chocolate.  Peppermint or mint flavorings.  Garlic and onions.  Spicy foods.  Citrus fruits, such as oranges, lemons, or limes.  Tomato-based foods such as sauce, chili, salsa, and pizza.  Fried and fatty foods.  Avoid eating for the 3 hours prior to your bedtime.  Eat small, frequent meals instead of large meals.  Stop smoking if you smoke.  Maintain a healthy weight.  Wear loose-fitting clothing. Do not wear anything tight around your waist that causes pressure on your stomach.  Raise the head of your bed 4 to 8 inches with wood blocks to help you sleep. Extra pillows will not help.  Only take over-the-counter or prescription medicines as directed by your caregiver.  Do not take aspirin, ibuprofen, or other nonsteroidal anti-inflammatory drugs (NSAIDs). SEEK IMMEDIATE MEDICAL CARE IF:   You are not better after 2 days.  You have chest pressure or pain that radiates up into your neck, arms, back, jaw, or upper abdomen.  You have difficulty swallowing.  You keep vomiting.  You have black or bloody stools.  You have a fever.  You have dizziness, fainting, difficulty breathing, or heavy sweating.  You have severe abdominal pain.  You lose weight without trying. MAKE SURE YOU:  Understand these instructions.  Will watch your condition.  Will get help right away if you are not doing well or get worse. Document Released: 07/26/2004 Document Revised: 09/10/2011 Document Reviewed: 01/31/2011 [facility] LLCExit[facility] Patient Information 2014 [place], [place].

## 2013-09-15 ENCOUNTER — Other Ambulatory Visit: Payer: Self-pay | Admitting: Internal Medicine

## 2013-09-17 ENCOUNTER — Telehealth: Payer: Self-pay | Admitting: Internal Medicine

## 2013-09-17 NOTE — Telephone Encounter (Signed)
Pt called regarding a refill of her medication traMADol (ULTRAM) 50 MG, please contact pt ° °

## 2013-09-17 NOTE — Telephone Encounter (Signed)
Patient requesting refill on tramadol

## 2013-09-21 ENCOUNTER — Telehealth: Payer: Self-pay | Admitting: Internal Medicine

## 2013-09-21 NOTE — Telephone Encounter (Signed)
Pt called regarding a refill of her medication traMADol (ULTRAM) 50 MG, Please contact pt

## 2013-09-21 NOTE — Telephone Encounter (Signed)
Spoke with pt regards to refill pain meds. Informed pt, she will need to schedule ov before refill appt scheduled for 4/1/415

## 2013-09-30 ENCOUNTER — Encounter (HOSPITAL_COMMUNITY): Payer: Self-pay | Admitting: Emergency Medicine

## 2013-09-30 ENCOUNTER — Emergency Department (INDEPENDENT_AMBULATORY_CARE_PROVIDER_SITE_OTHER)
Admission: EM | Admit: 2013-09-30 | Discharge: 2013-09-30 | Disposition: A | Discharge status: Home / Self Care | Payer: Self-pay | Source: Home / Self Care | Attending: Family Medicine | Admitting: Family Medicine

## 2013-09-30 DIAGNOSIS — M546 Pain in thoracic spine: Secondary | ICD-10-CM

## 2013-09-30 DIAGNOSIS — M549 Dorsalgia, unspecified: Secondary | ICD-10-CM

## 2013-09-30 DIAGNOSIS — D571 Sickle-cell disease without crisis: Secondary | ICD-10-CM

## 2013-09-30 MED ORDER — TRAMADOL HCL 50 MG PO TABS: 50.0000 mg | ORAL_TABLET | Freq: Three times a day (TID) | ORAL | Status: DC

## 2013-09-30 NOTE — Discharge Instructions (Signed)
Thank you for coming in today. Take tramadol for pain as needed.  Follow up with the primary care doctors on April 14th soon.  If you get a lot worse go to the ER.  Come back or go to the emergency room if you notice new weakness new numbness problems walking or bowel or bladder problems.  Sickle Cell Anemia, Adult Sickle cell anemia is a condition in which red blood cells have an abnormal "sickle" shape. This abnormal shape shortens the cells' life span, which results in a lower than normal concentration of red blood cells in the blood. The sickle shape also causes the cells to clump together and block free blood flow through the blood vessels. As a result, the tissues and organs of the body do not receive enough oxygen. Sickle cell anemia causes organ damage and pain and increases the risk of infection. CAUSES  Sickle cell anemia is a genetic disorder. Those who receive two copies of the gene have the condition, and those who receive one copy have the trait. RISK FACTORS The sickle cell gene is most common in people whose families originated in [place]. Other areas of the globe where sickle cell trait occurs include the [place], [place], the [place], and the [place].  SIGNS AND SYMPTOMS  Pain, especially in the extremities, back, chest, or abdomen (common). The pain may start suddenly or may develop following an illness, especially if there is dehydration. Pain can also occur due to overexertion or exposure to extreme temperature changes.  Frequent severe bacterial infections, especially certain types of pneumonia and meningitis.  Pain and swelling in the hands and feet.  Decreased activity.   Loss of appetite.   Change in behavior.  Headaches.  Seizures.  Shortness of breath or difficulty breathing.  Vision changes.  Skin ulcers. Those with the trait may not have symptoms or they may have mild symptoms.  DIAGNOSIS  Sickle cell anemia is diagnosed  with blood tests that demonstrate the genetic trait. It is often diagnosed during the newborn period, due to mandatory testing nationwide. A variety of blood tests, X-rays, CT scans, MRI scans, ultrasounds, and lung function tests may also be done to monitor the condition. TREATMENT  Sickle cell anemia may be treated with:  Medicines. You may be given pain medicines, antibiotic medicines (to treat and prevent infections) or medicines to increase the production of certain types of hemoglobin.  Fluids.  Oxygen.  Blood transfusions. HOME CARE INSTRUCTIONS   Drink enough fluid to keep your urine clear or pale yellow. Increase your fluid intake in hot weather and during exercise.  Do not smoke. Smoking lowers oxygen levels in the blood.   Only take over-the-counter or prescription medicines for pain, fever, or discomfort as directed by your health care provider.  Take antibiotics as directed by your health care provider. Make sure you finish them it even if you start to feel better.   Take supplements as directed by your health care provider.   Consider wearing a medical alert bracelet. This tells anyone caring for you in an emergency of your condition.   When traveling, keep your medical information, health care provider's names, and the medicines you take with you at all times.   If you develop a fever, do not take medicines to reduce the fever right away. This could cover up a problem that is developing. Notify your health care provider.  Keep all follow-up appointments with your health care provider. Sickle cell anemia requires  regular medical care. SEEK MEDICAL CARE IF: You have a fever. SEEK IMMEDIATE MEDICAL CARE IF:   You feel dizzy or faint.   You have new abdominal pain, especially on the left side near the stomach area.   You develop a persistent, often uncomfortable and painful penile erection (priapism). If this is not treated immediately it will lead to  impotence.   You have numbness your arms or legs or you have a hard time moving them.   You have a hard time with speech.   You have a fever or persistent symptoms for more than 2 3 days.   You have a fever and your symptoms suddenly get worse.   You have signs or symptoms of infection. These include:   Chills.   Abnormal tiredness (lethargy).   Irritability.   Poor eating.   Vomiting.   You develop pain that is not helped with medicine.   You develop shortness of breath.  You have pain in your chest.   You are coughing up pus-like or bloody sputum.   You develop a stiff neck.  Your feet or hands swell or have pain.  Your abdomen appears bloated.  You develop joint pain. MAKE SURE YOU:  Understand these instructions.  Will watch your child's condition.  Will get help right away if your child is not doing well or gets worse. Document Released: 09/26/2005 Document Revised: 04/08/2013 Document Reviewed: 01/28/2013 [facility] Patient Information 2014 [place], [place].

## 2013-09-30 NOTE — ED Notes (Signed)
C/o back pain and bilateral leg pain Admits to having sickle cell States she does have a cough

## 2013-09-30 NOTE — ED Provider Notes (Signed)
Kayla Woodard is a 58 y.o. female who presents to Urgent Care today for thoracic back pain. Patient has a history of sickle cell disease and has developed thoracic back pain for the past 3 days. She has run out of her tramadol and has an appointment with her primary care provider for April 14. The pain is moderate and consistent with sickle cell pain crisis. She denies any shortness of breath fevers chills nausea vomiting or diarrhea. She denies any lower extremity weakness or difficulty walking.   Past Medical History  Diagnosis Date  . Anemia   . Back pain, chronic   . Hypertension   . Sickle cell anemia    History  Substance Use Topics  . Smoking status: Not on file  . Smokeless tobacco: Not on file  . Alcohol Use: No   ROS as above Medications: No current facility-administered medications for this encounter.   Current Outpatient Prescriptions  Medication Sig Dispense Refill  . aspirin EC 81 MG tablet Take 81 mg by mouth daily.      . cetirizine (ZYRTEC) 10 MG tablet Take 10 mg by mouth daily.      Marland Kitchen. HYDROmorphone (DILAUDID) 2 MG tablet Take 0.5-1 tablets (1-2 mg total) by mouth every 4 (four) hours as needed for pain.  20 tablet  0  . losartan (COZAAR) 100 MG tablet Take 1 tablet (100 mg total) by mouth daily.  30 tablet  3  . meclizine (ANTIVERT) 50 MG tablet Take 1 tablet (50 mg total) by mouth 3 (three) times daily as needed.  30 tablet  0  . metroNIDAZOLE (FLAGYL) 500 MG tablet Take 1 tablet (500 mg total) by mouth 3 (three) times daily.  21 tablet  0  . omeprazole (PRILOSEC) 40 MG capsule Take 1 capsule (40 mg total) by mouth daily.  30 capsule  1  . ondansetron (ZOFRAN) 4 MG tablet Take 1 tablet (4 mg total) by mouth every 8 (eight) hours as needed for nausea.  20 tablet  0  . potassium chloride (K-DUR) 10 MEQ tablet Take 10 mEq by mouth 2 (two) times daily.      . traMADol (ULTRAM) 50 MG tablet Take 1 tablet (50 mg total) by mouth 3 (three) times daily.  40 tablet  0  .  vitamin C (ASCORBIC ACID) 500 MG tablet Take 500 mg by mouth daily.        Exam:  BP 127/87  Pulse 72  Temp(Src) 98 F (36.7 C) (Oral)  Resp 16  SpO2 98% Gen: Well NAD Spine: Nontender to spinal midline. Normal range of motion. Lower extremity strength reflexes and sensation are intact. Normal gait.  No results found for this or any previous visit (from the past 24 hour(s)). No results found.  Assessment and Plan: 58 y.o. female with thoracic back pain secondary to pain crisis. Plan to treat with   limited amount of tramadol and followup with primary care provider in 13 days.  Discussed warning signs or symptoms. Please see discharge instructions. Patient expresses understanding.    Rodolph BongEvan S Corey, MD 09/30/13 [phone]

## 2013-10-13 ENCOUNTER — Ambulatory Visit: Payer: Self-pay | Attending: Internal Medicine | Admitting: Internal Medicine

## 2013-10-13 ENCOUNTER — Encounter: Payer: Self-pay | Admitting: Internal Medicine

## 2013-10-13 VITALS — BP 133/79 | HR 73 | Temp 98.4°F | Resp 16 | Ht 61.0 in | Wt 165.0 lb

## 2013-10-13 DIAGNOSIS — Z1211 Encounter for screening for malignant neoplasm of colon: Secondary | ICD-10-CM

## 2013-10-13 DIAGNOSIS — Z760 Encounter for issue of repeat prescription: Secondary | ICD-10-CM | POA: Insufficient documentation

## 2013-10-13 DIAGNOSIS — Z139 Encounter for screening, unspecified: Secondary | ICD-10-CM

## 2013-10-13 DIAGNOSIS — I10 Essential (primary) hypertension: Secondary | ICD-10-CM | POA: Insufficient documentation

## 2013-10-13 DIAGNOSIS — G8929 Other chronic pain: Secondary | ICD-10-CM | POA: Insufficient documentation

## 2013-10-13 DIAGNOSIS — K219 Gastro-esophageal reflux disease without esophagitis: Secondary | ICD-10-CM | POA: Insufficient documentation

## 2013-10-13 DIAGNOSIS — M549 Dorsalgia, unspecified: Secondary | ICD-10-CM | POA: Insufficient documentation

## 2013-10-13 DIAGNOSIS — D571 Sickle-cell disease without crisis: Secondary | ICD-10-CM | POA: Insufficient documentation

## 2013-10-13 DIAGNOSIS — M62830 Muscle spasm of back: Secondary | ICD-10-CM

## 2013-10-13 DIAGNOSIS — M5380 Other specified dorsopathies, site unspecified: Secondary | ICD-10-CM | POA: Insufficient documentation

## 2013-10-13 MED ORDER — GABAPENTIN 100 MG PO CAPS: 100.0000 mg | ORAL_CAPSULE | Freq: Three times a day (TID) | ORAL | Status: DC

## 2013-10-13 MED ORDER — LOSARTAN POTASSIUM 100 MG PO TABS: 100.0000 mg | ORAL_TABLET | Freq: Every day | ORAL | Status: DC

## 2013-10-13 MED ORDER — TRAMADOL HCL 50 MG PO TABS
50.0000 mg | ORAL_TABLET | Freq: Two times a day (BID) | ORAL | Status: DC | PRN
Start: 2013-10-13 — End: 2013-11-02

## 2013-10-13 MED ORDER — OMEPRAZOLE 40 MG PO CPDR: 40.0000 mg | DELAYED_RELEASE_CAPSULE | Freq: Every day | ORAL | Status: DC

## 2013-10-13 NOTE — Patient Instructions (Signed)
DASH Diet  The DASH diet stands for "Dietary Approaches to Stop Hypertension." It is a healthy eating plan that has been shown to reduce high blood pressure (hypertension) in as little as 14 days, while also possibly providing other significant health benefits. These other health benefits include reducing the risk of breast cancer after menopause and reducing the risk of type 2 diabetes, heart disease, colon cancer, and stroke. Health benefits also include weight loss and slowing kidney failure in patients with chronic kidney disease.   DIET GUIDELINES  · Limit salt (sodium). Your diet should contain less than 1500 mg of sodium daily.  · Limit refined or processed carbohydrates. Your diet should include mostly whole grains. Desserts and added sugars should be used sparingly.  · Include small amounts of heart-healthy fats. These types of fats include nuts, oils, and tub margarine. Limit saturated and trans fats. These fats have been shown to be harmful in the body.  CHOOSING FOODS   The following food groups are based on a 2000 calorie diet. See your Registered Dietitian for individual calorie needs.  Grains and Grain Products (6 to 8 servings daily)  · Eat More Often: Whole-wheat bread, brown rice, whole-grain or wheat pasta, quinoa, popcorn without added fat or salt (air popped).  · Eat Less Often: White bread, white pasta, white rice, cornbread.  Vegetables (4 to 5 servings daily)  · Eat More Often: Fresh, frozen, and canned vegetables. Vegetables may be raw, steamed, roasted, or grilled with a minimal amount of fat.  · Eat Less Often/Avoid: Creamed or fried vegetables. Vegetables in a cheese sauce.  Fruit (4 to 5 servings daily)  · Eat More Often: All fresh, canned (in natural juice), or frozen fruits. Dried fruits without added sugar. One hundred percent fruit juice (½ cup [237 mL] daily).  · Eat Less Often: Dried fruits with added sugar. Canned fruit in light or heavy syrup.  Lean Meats, Fish, and Poultry (2  servings or less daily. One serving is 3 to 4 oz [85-114 g]).  · Eat More Often: Ninety percent or leaner ground beef, tenderloin, sirloin. Round cuts of beef, chicken breast, turkey breast. All fish. Grill, bake, or broil your meat. Nothing should be fried.  · Eat Less Often/Avoid: Fatty cuts of meat, turkey, or chicken leg, thigh, or wing. Fried cuts of meat or fish.  Dairy (2 to 3 servings)  · Eat More Often: Low-fat or fat-free milk, low-fat plain or light yogurt, reduced-fat or part-skim cheese.  · Eat Less Often/Avoid: Milk (whole, 2%). Whole milk yogurt. Full-fat cheeses.  Nuts, Seeds, and Legumes (4 to 5 servings per week)  · Eat More Often: All without added salt.  · Eat Less Often/Avoid: Salted nuts and seeds, canned beans with added salt.  Fats and Sweets (limited)  · Eat More Often: Vegetable oils, tub margarines without trans fats, sugar-free gelatin. Mayonnaise and salad dressings.  · Eat Less Often/Avoid: Coconut oils, palm oils, butter, stick margarine, cream, half and half, cookies, candy, pie.  FOR MORE INFORMATION  The Dash Diet Eating Plan: www.dashdiet.org  Document Released: 06/07/2011 Document Revised: 09/10/2011 Document Reviewed: 06/07/2011  [employer]® Patient Information ©2014 [employer].

## 2013-10-13 NOTE — Progress Notes (Signed)
Pt is here following up on her HTN. For 2 weeks pt has been having severe pain in her abdomen.

## 2013-10-13 NOTE — Progress Notes (Signed)
MRN: [id number] Name: Kayla Woodard  Sex: female Age: 58 y.o. DOB: 08-08-1955  Allergies: Oxycodone and Vicodin  Chief Complaint  Patient presents with  . Follow-up    HPI: Patient is 58 y.o. female who has history of hypertension, sickle-cell disease chronic back pain GERD her, comes today requesting refill on her pain medication, as per patient she saw a specialist for her her sickle cell disease in the past, she also went to the urgent care 2 weeks ago and was given tramadol for the pain, I have counseled patient to use this medication only when necessary she gets pain in her joints back abdomen denies any nausea vomiting any change in bowel habits, patient is up-to-date her with her mammogram but has not had colonoscopy done and had her Pap smear done long time ago.  Past Medical History  Diagnosis Date  . Anemia   . Back pain, chronic   . Hypertension   . Sickle cell anemia     History reviewed. No pertinent past surgical history.    Medication List       This list is accurate as of: 10/13/13  3:15 PM.  Always use your most recent med list.               aspirin EC 81 MG tablet  Take 81 mg by mouth daily.     cetirizine 10 MG tablet  Commonly known as:  ZYRTEC  Take 10 mg by mouth daily.     gabapentin 100 MG capsule  Commonly known as:  NEURONTIN  Take 1 capsule (100 mg total) by mouth 3 (three) times daily.     HYDROmorphone 2 MG tablet  Commonly known as:  DILAUDID  Take 0.5-1 tablets (1-2 mg total) by mouth every 4 (four) hours as needed for pain.     losartan 100 MG tablet  Commonly known as:  COZAAR  Take 1 tablet (100 mg total) by mouth daily.     meclizine 50 MG tablet  Commonly known as:  ANTIVERT  Take 1 tablet (50 mg total) by mouth 3 (three) times daily as needed.     metroNIDAZOLE 500 MG tablet  Commonly known as:  FLAGYL  Take 1 tablet (500 mg total) by mouth 3 (three) times daily.     omeprazole 40 MG capsule  Commonly known as:   PRILOSEC  Take 1 capsule (40 mg total) by mouth daily.     ondansetron 4 MG tablet  Commonly known as:  ZOFRAN  Take 1 tablet (4 mg total) by mouth every 8 (eight) hours as needed for nausea.     potassium chloride 10 MEQ tablet  Commonly known as:  K-DUR  Take 10 mEq by mouth 2 (two) times daily.     traMADol 50 MG tablet  Commonly known as:  ULTRAM  Take 1 tablet (50 mg total) by mouth every 12 (twelve) hours as needed.     vitamin C 500 MG tablet  Commonly known as:  ASCORBIC ACID  Take 500 mg by mouth daily.        Meds ordered this encounter  Medications  . gabapentin (NEURONTIN) 100 MG capsule    Sig: Take 1 capsule (100 mg total) by mouth 3 (three) times daily.    Dispense:  90 capsule    Refill:  3  . traMADol (ULTRAM) 50 MG tablet    Sig: Take 1 tablet (50 mg total) by mouth every 12 (twelve) hours as needed.  Dispense:  40 tablet    Refill:  0  . losartan (COZAAR) 100 MG tablet    Sig: Take 1 tablet (100 mg total) by mouth daily.    Dispense:  30 tablet    Refill:  3  . omeprazole (PRILOSEC) 40 MG capsule    Sig: Take 1 capsule (40 mg total) by mouth daily.    Dispense:  30 capsule    Refill:  3    Immunization History  Administered Date(s) Administered  . Influenza Whole 04/01/2009, 06/05/2010  . Td 07/07/2007    History reviewed. No pertinent family history.  History  Substance Use Topics  . Smoking status: Never Smoker   . Smokeless tobacco: Not on file  . Alcohol Use: No    Review of Systems   As noted in HPI  Filed Vitals:   10/13/13 1445  BP: 133/79  Pulse: 73  Temp: 98.4 F (36.9 C)  Resp: 16    Physical Exam  Physical Exam  Constitutional: No distress.  Eyes: EOM are normal. Pupils are equal, round, and reactive to light.  Cardiovascular: Normal rate and regular rhythm.   Pulmonary/Chest: Breath sounds normal. No respiratory distress. She has no wheezes. She has no rales.  Abdominal: Soft. She exhibits no distension.  There is no tenderness. There is no rebound.  Musculoskeletal:  Lower lumbar paraspinal tenderness, with SLR test patient is complaining of back discomfort denies any shooting pain down to the legs. Equal strength both lower extremities.    CBC    Component Value Date/Time   WBC 5.3 03/25/2013 1441   WBC 4.9 11/21/2010 1336   RBC 4.52 03/25/2013 1441   RBC 4.52 03/25/2013 1441   RBC 3.82 11/21/2010 1336   HGB 11.5* 03/25/2013 1441   HGB 10.2* 11/21/2010 1336   HCT 33.3* 03/25/2013 1441   HCT 29.2* 11/21/2010 1336   PLT 160 03/25/2013 1441   PLT 149 11/21/2010 1336   MCV 73.7* 03/25/2013 1441   MCV 76.3* 11/21/2010 1336   LYMPHSABS 1.7 03/25/2013 1441   LYMPHSABS 1.6 11/21/2010 1336   MONOABS 0.4 03/25/2013 1441   MONOABS 0.3 11/21/2010 1336   EOSABS 0.1 03/25/2013 1441   EOSABS 0.1 11/21/2010 1336   BASOSABS 0.0 03/25/2013 1441   BASOSABS 0.0 11/21/2010 1336    CMP     Component Value Date/Time   NA 141 03/25/2013 1441   K 4.1 03/25/2013 1441   CL 107 03/25/2013 1441   CO2 28 03/25/2013 1441   GLUCOSE 108* 03/25/2013 1441   BUN 10 03/25/2013 1441   CREATININE 0.73 03/25/2013 1441   CREATININE 0.85 11/12/2012 1025   CALCIUM 9.1 03/25/2013 1441   PROT 7.7 03/25/2013 1441   ALBUMIN 4.3 03/25/2013 1441   AST 12 03/25/2013 1441   ALT 9 03/25/2013 1441   ALKPHOS 80 03/25/2013 1441   BILITOT 1.3* 03/25/2013 1441   GFRNONAA 76* 11/12/2012 1025   GFRAA 88* 11/12/2012 1025    No results found for this basename: chol, tri, ldl    No components found with this basename: hga1c    Lab Results  Component Value Date/Time   AST 12 03/25/2013  2:41 PM    Assessment and Plan  Unspecified essential hypertension - Plan: Blood pressure is well controlled continue with losartan (COZAAR) 100 MG tablet, advised for DASH diet. Her  Sickle-cell disease, unspecified - Plan: CBC with Differential, traMADol (ULTRAM) 50 MG tablet when necessary  Backache, unspecified - Plan: Started patient on gabapentin (NEURONTIN)  100 MG capsule, traMADol (ULTRAM) 50 MG tablet  Back muscle spasm - Plan: gabapentin (NEURONTIN) 100 MG capsule  GERD (gastroesophageal reflux disease) - Plan: omeprazole (PRILOSEC) 40 MG capsule  Special screening for malignant neoplasms, colon - Plan: Ambulatory referral to Gastroenterology  Screening - Plan: Ambulatory referral to Obstetrics / Gynecology, COMPLETE METABOLIC PANEL WITH GFR, Lipid panel, Vit D  25 hydroxy (rtn osteoporosis monitoring), TSH   Health Maintenance -Colonoscopy: referred to GI  -Pap Smear: referred to GYN -Mammogram: uptodate   Return in about 3 months (around 01/12/2014) for hypertension, back pain.  Doris Cheadle, MD

## 2013-10-14 ENCOUNTER — Telehealth: Payer: Self-pay

## 2013-10-14 ENCOUNTER — Other Ambulatory Visit: Payer: Self-pay

## 2013-10-14 DIAGNOSIS — G894 Chronic pain syndrome: Secondary | ICD-10-CM

## 2013-10-14 NOTE — Telephone Encounter (Signed)
Patient called requesting a total of 90 tramadol She received a prescription yesterday for tramadol #40 Per dr Orpah Cobbadvani when this needs to be refilled we can do that- But patient needs to follow up with pain management Referral to pain management put in Epic

## 2013-11-02 ENCOUNTER — Telehealth: Payer: Self-pay

## 2013-11-02 ENCOUNTER — Other Ambulatory Visit: Payer: Self-pay

## 2013-11-02 DIAGNOSIS — M549 Dorsalgia, unspecified: Secondary | ICD-10-CM

## 2013-11-02 DIAGNOSIS — D571 Sickle-cell disease without crisis: Secondary | ICD-10-CM

## 2013-11-02 MED ORDER — TRAMADOL HCL 50 MG PO TABS: 50.0000 mg | ORAL_TABLET | Freq: Two times a day (BID) | ORAL | Status: DC | PRN

## 2013-11-02 NOTE — Telephone Encounter (Signed)
Patient called requesting a refill on her tramadol Per Dr Hyman Hopesjegede ok to refill #60 no refills

## 2013-11-03 ENCOUNTER — Other Ambulatory Visit: Payer: Self-pay | Admitting: Internal Medicine

## 2013-11-17 ENCOUNTER — Other Ambulatory Visit: Payer: Self-pay | Admitting: *Deleted

## 2013-11-17 DIAGNOSIS — I10 Essential (primary) hypertension: Secondary | ICD-10-CM

## 2013-11-17 DIAGNOSIS — M549 Dorsalgia, unspecified: Secondary | ICD-10-CM

## 2013-11-17 DIAGNOSIS — K219 Gastro-esophageal reflux disease without esophagitis: Secondary | ICD-10-CM

## 2013-11-17 DIAGNOSIS — D571 Sickle-cell disease without crisis: Secondary | ICD-10-CM

## 2013-11-17 MED ORDER — LOSARTAN POTASSIUM 100 MG PO TABS: 100.0000 mg | ORAL_TABLET | Freq: Every day | ORAL | Status: DC

## 2013-11-17 MED ORDER — OMEPRAZOLE 40 MG PO CPDR: 40.0000 mg | DELAYED_RELEASE_CAPSULE | Freq: Every day | ORAL | Status: DC

## 2013-11-17 NOTE — Telephone Encounter (Signed)
Patient called in today wanting a refill on her medications. Patient informed that she will have to come pick up Tramadol prescription if PCP refills it. [name], RN

## 2013-11-24 ENCOUNTER — Encounter: Payer: Self-pay | Admitting: *Deleted

## 2013-11-24 ENCOUNTER — Encounter (HOSPITAL_COMMUNITY): Payer: Self-pay | Admitting: Emergency Medicine

## 2013-11-24 ENCOUNTER — Emergency Department (INDEPENDENT_AMBULATORY_CARE_PROVIDER_SITE_OTHER)
Admission: EM | Admit: 2013-11-24 | Discharge: 2013-11-24 | Disposition: A | Discharge status: Home / Self Care | Payer: Self-pay | Source: Home / Self Care

## 2013-11-24 ENCOUNTER — Telehealth: Payer: Self-pay | Admitting: Internal Medicine

## 2013-11-24 DIAGNOSIS — D5700 Hb-SS disease with crisis, unspecified: Secondary | ICD-10-CM

## 2013-11-24 LAB — POCT I-STAT, CHEM 8
BUN: 7 mg/dL (ref 6–23)
CREATININE: 0.9 mg/dL (ref 0.50–1.10)
Calcium, Ion: 1.24 mmol/L — ABNORMAL HIGH (ref 1.12–1.23)
Chloride: 105 mEq/L (ref 96–112)
GLUCOSE: 100 mg/dL — AB (ref 70–99)
HCT: 30 % — ABNORMAL LOW (ref 36.0–46.0)
HEMOGLOBIN: 10.2 g/dL — AB (ref 12.0–15.0)
Potassium: 4.3 mEq/L (ref 3.7–5.3)
Sodium: 143 mEq/L (ref 137–147)
TCO2: 23 mmol/L (ref 0–100)

## 2013-11-24 MED ORDER — KETOROLAC TROMETHAMINE 60 MG/2ML IM SOLN
INTRAMUSCULAR | Status: AC
  Filled 2013-11-24: qty 2

## 2013-11-24 MED ORDER — TRAMADOL HCL 50 MG PO TABS: 50.0000 mg | ORAL_TABLET | Freq: Four times a day (QID) | ORAL | Status: DC | PRN

## 2013-11-24 MED ORDER — KETOROLAC TROMETHAMINE 60 MG/2ML IM SOLN
60.0000 mg | Freq: Once | INTRAMUSCULAR | Status: AC
  Administered 2013-11-24: 60 mg via INTRAMUSCULAR

## 2013-11-24 NOTE — ED Notes (Signed)
Generalized pain, onset this am.  Reports this is sickle cell pain, [facility] too busy to see [facility] and medicaid paperwork is not complete.  Allergies to oxycodone, vicodin, hydrocodone

## 2013-11-24 NOTE — Discharge Instructions (Signed)
You are suffering from a sickle cell pain crisis Please start the tramadol Please go to the emergency room if this does not stop your pain.  Please follow up with your regular doctor in the next couple of days.

## 2013-11-24 NOTE — Progress Notes (Signed)
Pt came in today requesting tramadol. She still had a week left on her last RX. Because its too early to have a refill Dr. Orpah Cobb would not write another RX. She left the office stating that she was going to the ER to get her pain meds.

## 2013-11-24 NOTE — Telephone Encounter (Signed)
Pt calling for Tramadol refill. Please f/u with pt.  °

## 2013-11-24 NOTE — ED Provider Notes (Signed)
CSN: [id number]     Arrival date & time 11/24/13  1526 History   None    Chief Complaint  Patient presents with  . Pain   (Consider location/radiation/quality/duration/timing/severity/associated sxs/prior Treatment) HPI  Pain: located in back and ribs. This is pts place of typical Wirt pain. Started this morning. typcially treats w/ tramadol but ran out. Went to PCP who said they were to busy to see her. Deneis SOB, palpitations, sycnoep, HA, CP, abd pain. Poor hydration and nutrition are likely pts triggers for this episode.     Past Medical History  Diagnosis Date  . Anemia   . Back pain, chronic   . Hypertension   . Sickle cell anemia    History reviewed. No pertinent past surgical history. No family history on file. History  Substance Use Topics  . Smoking status: Never Smoker   . Smokeless tobacco: Not on file  . Alcohol Use: No   OB History   Grav Para Term Preterm Abortions TAB SAB Ect Mult Living                 Review of Systems  Constitutional: Negative for fever, chills, activity change and appetite change.  Respiratory: Negative for cough, shortness of breath, wheezing and stridor.   Cardiovascular: Negative for chest pain and leg swelling.  Musculoskeletal: Positive for arthralgias and back pain.  All other systems reviewed and are negative.   Allergies  Oxycodone and Vicodin  Home Medications   Prior to Admission medications   Medication Sig Start Date End Date Taking? Authorizing Provider  aspirin EC 81 MG tablet Take 81 mg by mouth daily.    Historical Provider, MD  cetirizine (ZYRTEC) 10 MG tablet Take 10 mg by mouth daily.    Historical Provider, MD  gabapentin (NEURONTIN) 100 MG capsule Take 1 capsule (100 mg total) by mouth 3 (three) times daily. 10/13/13   Doris Cheadle, MD  HYDROmorphone (DILAUDID) 2 MG tablet Take 0.5-1 tablets (1-2 mg total) by mouth every 4 (four) hours as needed for pain. 03/25/13   Dorothea Ogle, MD  losartan (COZAAR) 100 MG  tablet Take 1 tablet (100 mg total) by mouth daily. 11/17/13   Doris Cheadle, MD  meclizine (ANTIVERT) 50 MG tablet Take 1 tablet (50 mg total) by mouth 3 (three) times daily as needed. 11/12/12   Arthor Captain, PA-C  metroNIDAZOLE (FLAGYL) 500 MG tablet Take 1 tablet (500 mg total) by mouth 3 (three) times daily. 03/25/13   Dorothea Ogle, MD  omeprazole (PRILOSEC) 40 MG capsule Take 1 capsule (40 mg total) by mouth daily. 11/17/13   Doris Cheadle, MD  ondansetron (ZOFRAN) 4 MG tablet Take 1 tablet (4 mg total) by mouth every 8 (eight) hours as needed for nausea. 03/25/13   Dorothea Ogle, MD  potassium chloride (K-DUR) 10 MEQ tablet Take 10 mEq by mouth 2 (two) times daily.    Historical Provider, MD  traMADol (ULTRAM) 50 MG tablet Take 1 tablet (50 mg total) by mouth every 12 (twelve) hours as needed. 11/02/13   Jeanann Lewandowsky, MD  vitamin C (ASCORBIC ACID) 500 MG tablet Take 500 mg by mouth daily.    Historical Provider, MD   BP 116/83  Pulse 70  Temp(Src) 98 F (36.7 C) (Oral)  Resp 16  SpO2 100% Physical Exam  Constitutional: She is oriented to person, place, and time. She appears well-developed and well-nourished. No distress.  HENT:  Head: Normocephalic and atraumatic.  Eyes: EOM are  normal. Pupils are equal, round, and reactive to light.  Neck: Normal range of motion. No thyromegaly present.  Cardiovascular: Normal rate, regular rhythm, normal heart sounds and intact distal pulses.  Exam reveals no gallop and no friction rub.   No murmur heard. Pulmonary/Chest: Effort normal and breath sounds normal. No respiratory distress. She has no wheezes. She has no rales. She exhibits no tenderness.  Abdominal: Soft. Bowel sounds are normal. She exhibits no distension.  Musculoskeletal:  Thoracic back pain bilat w/ lateral extension along the ribs. No effusions or bony abnormality  Neurological: She is oriented to person, place, and time. No cranial nerve deficit.  Skin: Skin is warm. No rash  noted. She is not diaphoretic. No erythema. No pallor.  Psychiatric: She has a normal mood and affect. Her behavior is normal. Judgment and thought content normal.    ED Course  Procedures (including critical care time) Labs Review Labs Reviewed  POCT I-STAT, CHEM 8 - Abnormal; Notable for the following:    Glucose, Bld 100 (*)    Calcium, Ion 1.24 (*)    Hemoglobin 10.2 (*)    HCT 30.0 (*)    All other components within normal limits    Imaging Review No results found.   MDM   1. Sickle cell pain crisis    Sickle cell pain crisis: likely somewhat dehydrated but would prefer to go home and drinkg fluids instead of receive iv resusitation here. Given toradol. Hgb 10.2 which appears slightly below baseline. No sign of acute chest. F/u PCP this week or go to ED if worsens.  - precautions given and all questions answwerd  Shelly Flattenavid Elouise Divelbiss, MD Family Medicine PGY-3 11/24/2013, 5:17 PM     Ozella Rocksavid J Jenniferann Stuckert, MD 11/24/13 [phone]

## 2013-11-26 ENCOUNTER — Emergency Department (HOSPITAL_COMMUNITY)
Admission: EM | Admit: 2013-11-26 | Discharge: 2013-11-26 | Disposition: A | Discharge status: Home / Self Care | Payer: Self-pay | Attending: Emergency Medicine | Admitting: Emergency Medicine

## 2013-11-26 ENCOUNTER — Encounter (HOSPITAL_COMMUNITY): Payer: Self-pay | Admitting: Emergency Medicine

## 2013-11-26 DIAGNOSIS — Z79899 Other long term (current) drug therapy: Secondary | ICD-10-CM | POA: Insufficient documentation

## 2013-11-26 DIAGNOSIS — D5700 Hb-SS disease with crisis, unspecified: Secondary | ICD-10-CM | POA: Insufficient documentation

## 2013-11-26 DIAGNOSIS — G8929 Other chronic pain: Secondary | ICD-10-CM | POA: Insufficient documentation

## 2013-11-26 DIAGNOSIS — I10 Essential (primary) hypertension: Secondary | ICD-10-CM | POA: Insufficient documentation

## 2013-11-26 LAB — CBC WITH DIFFERENTIAL/PLATELET
Basophils Absolute: 0 10*3/uL (ref 0.0–0.1)
Basophils Relative: 0 % (ref 0–1)
EOS PCT: 0 % (ref 0–5)
Eosinophils Absolute: 0 10*3/uL (ref 0.0–0.7)
HCT: 27.4 % — ABNORMAL LOW (ref 36.0–46.0)
HEMOGLOBIN: 10.1 g/dL — AB (ref 12.0–15.0)
LYMPHS ABS: 0.9 10*3/uL (ref 0.7–4.0)
LYMPHS PCT: 15 % (ref 12–46)
MCH: 26.1 pg (ref 26.0–34.0)
MCHC: 36.9 g/dL — ABNORMAL HIGH (ref 30.0–36.0)
MCV: 70.8 fL — AB (ref 78.0–100.0)
MONO ABS: 0.3 10*3/uL (ref 0.1–1.0)
Monocytes Relative: 5 % (ref 3–12)
Neutro Abs: 4.7 10*3/uL (ref 1.7–7.7)
Neutrophils Relative %: 79 % — ABNORMAL HIGH (ref 43–77)
Platelets: 135 10*3/uL — ABNORMAL LOW (ref 150–400)
RBC: 3.87 MIL/uL (ref 3.87–5.11)
RDW: 18 % — ABNORMAL HIGH (ref 11.5–15.5)
WBC: 5.9 10*3/uL (ref 4.0–10.5)

## 2013-11-26 LAB — COMPREHENSIVE METABOLIC PANEL
ALT: 8 U/L (ref 0–35)
AST: 13 U/L (ref 0–37)
Albumin: 3.8 g/dL (ref 3.5–5.2)
Alkaline Phosphatase: 82 U/L (ref 39–117)
BILIRUBIN TOTAL: 1.2 mg/dL (ref 0.3–1.2)
BUN: 9 mg/dL (ref 6–23)
CALCIUM: 9.1 mg/dL (ref 8.4–10.5)
CHLORIDE: 106 meq/L (ref 96–112)
CO2: 22 meq/L (ref 19–32)
CREATININE: 0.85 mg/dL (ref 0.50–1.10)
GFR, EST AFRICAN AMERICAN: 87 mL/min — AB (ref >90)
GFR, EST NON AFRICAN AMERICAN: 75 mL/min — AB (ref >90)
GLUCOSE: 160 mg/dL — AB (ref 70–99)
Potassium: 3.7 mEq/L (ref 3.7–5.3)
Sodium: 141 mEq/L (ref 137–147)
Total Protein: 7.5 g/dL (ref 6.0–8.3)

## 2013-11-26 LAB — RETICULOCYTES
RBC.: 3.87 MIL/uL (ref 3.87–5.11)
RETIC COUNT ABSOLUTE: 92.9 10*3/uL (ref 19.0–186.0)
Retic Ct Pct: 2.4 % (ref 0.4–3.1)

## 2013-11-26 MED ORDER — HYDROMORPHONE HCL 2 MG PO TABS: 2.0000 mg | ORAL_TABLET | ORAL | Status: DC | PRN

## 2013-11-26 MED ORDER — FENTANYL CITRATE 0.05 MG/ML IJ SOLN: 100.0000 ug | Freq: Once | INTRAMUSCULAR | Status: DC

## 2013-11-26 MED ORDER — FENTANYL CITRATE 0.05 MG/ML IJ SOLN
100.0000 ug | INTRAMUSCULAR | Status: DC | PRN
  Administered 2013-11-26: 100 ug via INTRAVENOUS
  Filled 2013-11-26: qty 2

## 2013-11-26 MED ORDER — SODIUM CHLORIDE 0.9 % IV BOLUS (SEPSIS)
1000.0000 mL | Freq: Once | INTRAVENOUS | Status: AC
  Administered 2013-11-26: 1000 mL via INTRAVENOUS

## 2013-11-26 NOTE — Telephone Encounter (Signed)
Patient was explained to that it was too soon to refill her medication Patient was angry and stated she was going to the ED to get her medication

## 2013-11-26 NOTE — ED Notes (Signed)
Sickle cell anemic, c/o generalized body aches for 3 days, went to [facility] this past Tuesday and was given shot in bottom but it did not help, is also c/o abdominal pain that is stabbing (upper left, mid, and right).  States this pain is unusual for when she has had sickle cell crisis.  Denies nausea, vomiting, diarrhea, blood in stool.  States she also has a headache for the same amount of time, 8/10 in severity

## 2013-11-26 NOTE — Discharge Instructions (Signed)
Sickle Cell Anemia, Adult Sickle cell anemia is a condition where your red blood cells are shaped like sickles. Red blood cells carry oxygen through the body. Sickle-shaped red blood cells cells do not live as long as normal red blood cells. They also clump together and block blood from flowing through the blood vessels. These things prevent the body from getting enough oxygen. Sickle cell anemia causes organ damage and pain. It also increases the risk of infection. HOME CARE  Drink enough fluid to keep your pee (urine) clear or pale yellow. Drink more in hot weather and during exercise.  Do not smoke. Smoking lowers oxygen levels in the blood.  Only take over-the-counter or prescription medicines as told by your doctor.  Take antibiotic medicines as told by your doctor. Make sure you finish them it even if you start to feel better.  Take supplements as told by your doctor.  Consider wearing a medical alert bracelet. This tells anyone caring for you in an emergency of your condition.  When traveling, keep your medical information, doctors' names, and the medicines you take with you at all times.  If you have a fever, do not take fever medicines right away. This could cover up a problem. Tell your doctor.   Keep all follow-up appointments with your doctor. Sickle cell anemia requires regular medical care. GET HELP IF: You have a fever. GET HELP RIGHT AWAY IF:  You feel dizzy or faint.  You have new belly (abdominal) pain, especially on the left side near the stomach area.  You have a lasting, often uncomfortable and painful erection of the penis (priapism). If it is not treated right away, you will become unable to have sex (impotence).  You have numbness your arms or legs or you have a hard time moving them.  You have a hard time talking.  You have a fever or lasting symptoms for more than 2 3 days.  You have a fever and your symptoms suddenly get worse.  You have signs or  symptoms of infection. These include:  Chills.  Being more tired than normal (lethargy).  Irritability.  Poor eating.  Throwing up (vomiting).  You have pain that is not helped with medicine.  You have shortness of breath.  You have pain in your chest.  You are coughing up pus-like or bloody mucus.  You have a stiff neck.  Your feet or hands swell or have pain.  Your belly looks bloated.  Your joints hurt. MAKE SURE YOU:  Understand these instructions.  Will watch your condition.  Will get help right away if you are not doing well or get worse. Document Released: 04/08/2013 Document Reviewed: 01/28/2013 [facility] Patient Information 2014 [place], [place].    Emergency Department Resource Guide 1) Find a Doctor and Pay Out of Pocket Although you won't have to find out who is covered by your insurance plan, it is a good idea to ask around and get recommendations. You will then need to call the office and see if the doctor you have chosen will accept you as a new patient and what types of options they offer for patients who are self-pay. Some doctors offer discounts or will set up payment plans for their patients who do not have insurance, but you will need to ask so you aren't surprised when you get to your appointment.  2) Contact Your Local Health Department Not all health departments have doctors that can see patients for sick visits, but many do, so  it is worth a call to see if yours does. If you don't know where your local health department is, you can check in your phone book. The CDC also has a tool to help you locate your state's health department, and many state websites also have listings of all of their local health departments.  3) Find a Walk-in Clinic If your illness is not likely to be very severe or complicated, you may want to try a walk in clinic. These are popping up all over the country in pharmacies, drugstores, and shopping centers. They're usually  staffed by nurse practitioners or physician assistants that have been trained to treat common illnesses and complaints. They're usually fairly quick and inexpensive. However, if you have serious medical issues or chronic medical problems, these are probably not your best option.  No Primary Care Doctor: - Call Health Connect at  [phone] - they can help you locate a primary care doctor that  accepts your insurance, provides certain services, etc. - Physician Referral Service- [phone]  Chronic Pain Problems: Organization         Address  Phone   Notes  [facility]  [phone] Patients need to be referred by their primary care doctor.   Medication Assistance: Organization         Address  Phone   Notes  [facility] Medication [facility] [address] [place] [place], [place] [address] [phone] --Must be a resident of [facility] -- Must have NO insurance coverage whatsoever (no Medicaid/ Medicare, etc.) -- The pt. MUST have a primary care doctor that directs their care regularly and follows them in the community   [facility]ist  [phone]   [employer]  [phone]    Agencies that provide inexpensive medical care: Organization         Address  Phone   Notes  [facility]  [phone]   [facility]    [phone]   [facility] [address], [place] [address] [phone]   [facility] [address] [place] [phone]   Planned Parenthood    [phone]   [facility]    [phone]   Community Health and [facility]  [address], [place] Phone:  [phone], Fax:  [phone] Hours of Operation:  9 am - 6 pm, M-F.  Also accepts Medicaid/Medicare and self-pay.  [facility]  [address], [place] Phone: [phone], Fax: [phone]. Hours of  Operation:  8:30 am - 5:30 pm, M-F.  Also accepts Medicaid and self-pay.  [facility] [place] [address], [place] Phone: [phone]   [facility] [address] Natasha Bence [place], [place] [phone], Ext. [phone] Mondays & Thursdays: 7-9 AM.  First 15 patients are seen on a first come, first serve basis.    Medicaid-accepting [facility] Providers:  Organization         Address  Phone   Notes  [facility] [address], [place] [phone] Also accepts self-pay patients.  [facility] [address] Laurell Josephs [place], [place]  [phone]   [facility] [address], [place] [phone]   [facility] [address] [place] [phone]   Renaye Rakers [address], [place]   [phone] Only accepts [place] patients after they have their name applied to their card.   Self-Pay (no insurance) in [facility]:  Organization         Address  Phone   Notes  Sickle Cell Patients, [facility] [address] [place], [place] [phone]   [facility] [address] [place], [place] [phone]   [facility] [place]  [address], [place] [phone]   [facility]/Dr. Osei-Bonsu  [address] [place] or [address], [place] [phone] Phone number for both [place] and [place] locations is the same.  [facility] [address], [place] [phone]   [facility] [address], [place] or [address] [phone]   [facility] [address] [place] [phone], phone; [phone], fax Sees patients 1st and 3rd Saturday of every month.  Must not qualify for public or private insurance (i.e. Medicaid, Medicare,  [place] Health Choice, Veterans' Benefits)  Household income should be no more than 200% of the poverty level The clinic cannot treat you if you are pregnant or think you are pregnant  Sexually transmitted diseases are not treated at the clinic.    Dental Care: Organization         Address  Phone  Notes  [facility] Department of [facility] [address] [place], [place] [phone] Accepts children up to age 42 who are enrolled in [place] or [place] Health Choice; pregnant women with a Medicaid card; and children who have applied for Medicaid or [place] Health Choice, but were declined, whose parents can pay a reduced fee at time of service.  [facility] Department of [facility]  [address], [place] [phone] Accepts children up to age 98 who are enrolled in [place] or [place] Health Choice; pregnant women with a Medicaid card; and children who have applied for Medicaid or  Health Choice, but were declined, whose parents can pay a reduced fee at time of service.  [facility]  [address] [place], [place] [phone] Patients are seen by appointment only. Walk-ins are not accepted. [facility] will see patients 4 years of age and older. Monday - Tuesday (8am-5pm) Most Wednesdays (8:30-5pm) $30 per visit, cash only  [facility]  [address], [facility] [phone] Patients are seen by appointment only. Walk-ins are not accepted. [facility] will see patients 7 years of age and older. One Wednesday Evening (Monthly: Volunteer Based).  $30 per visit, cash only  [employer]  [phone] for adults; Children under age 64, call Graduate Pediatric Dentistry at [phone]. Children aged 27-14, please call [phone] to request a pediatric application.  Dental services are provided in all areas of dental care including fillings, crowns and bridges,  complete and partial dentures, implants, gum treatment, root canals, and extractions. Preventive care is also provided. Treatment is provided to both adults and children. Patients are selected via a lottery and there is often a waiting list.   [facility] [address] [place]  [phone] [email or web address]   [facility] [address] [place], [place] [phone], Ext. [phone] Second and Fourth  Thursday of each month, opens at 6:30 AM; Clinic ends at 9 AM.  Patients are seen on a first-come first-served basis, and a limited number are seen during each clinic.   [facility]  [address], [place], [place] [phone]   Eligibility Requirements You must have lived in [place], [place], or [place] counties for at least the last three months.   You cannot be eligible for state or federal sponsored [place] Cityhealthcare insurance, including CIG[employer]eterans Administration, [place]icaid, or [employer]icare.   You generally cannot be eligible for healthcare insurance through your employer.    How to apply: Eligibility screenings are held every Tuesday and Wednesday afternoon from 1:00 pm until 4:00 pm. You do not need an appointment for the interview!  [facility] [address], [place], [place] [phone]   [facility]  [phone]   [facility]  [phone]   [facility]  [phone]    Behavioral Health Resources in the Community: Intensive Outpatient Programs Organization         Address  Phone  Notes  [facility] [address], [place], [place] [phone]   [facility] [address], [place], [place] [phone]   [facility] [address], [place], [place]  [phone]   [facility] [address],  [place], [place] [phone] or [phone]   Substance Abuse Resources Organization          Address  Phone  Notes  [facility]  [phone]   [facility]  [phone]   [facility]  [phone]   [facility]  [phone]   Residential & Outpatient Substance Abuse Program  [phone]   Psychological Services Organization         Address  Phone  Notes  [facility]  [phone]- [phone]   [facility]  [phone]- [phone]   [facility] [address], [place] [phone] or [phone]    Mobile Crisis Teams Organization         Address  Phone  Notes  [facility], Mobile Crisis Care Unit  [phone]   [facility]  [address] [place], [place] [phone]   Doristine LocksSharon DeEsch [address] [place] [phone]    Self-Help/Support Groups Organization         Address  Phone             Notes  [facility] [place] - variety of support groups  [phone] Call for more information  Narcotics Anonymous (NA), [facility] [address], [place]  2 meetings at this location   Statisticianesidential Treatment Programs Organization         Address  Phone  Notes  [facility] [address],    [place]  [phone]   [facility]  [address], [place] [phone], [place], [place] [phone]   [facility] [address], [place] [phone] Admissions: 8am-3pm M-F  [facility] [address]    [place], [place] [phone]   [facility] [address], [place], [place] [phone]   [facility] [address]  [place], [place] [phone]   [facility] - Intensive Outpatient [address], [place], [place] [phone]   [facility] ([facility] [phone]  [place], [place] [phone] or [phone]   [facility] ([facility]) [address] [place], [place]  [phone] Accepts Medicaid  [facility] [address]  [place] [phone] Substance Abuse/Addiction Treatment   [facility] Behavioral Health Resources Organization         Address  Phone  Notes  [facility]  [phone]) [phone]   Angie FavaJulie Brannon, PhD [address], [address] [place], [place]   [phone] or [phone]   [facility]   [address] [place] [address] [place], [place] [phone](336) [phone]   [facility] [address], [place], [place] [phone]) [phone] Insurance/Medicaid/sponsorship through [facility]  [facility] [address]                                    [place], [place] [phone](336) [phone] Therapy/tele-psych/case  [facility] [address]   [place], [place] [phone](336) [phone]    Dr. Lolly MustacheArfeen  [phone]   [facility]  [employer] [facility] 1) [address], [place] 2) [address], [place] 3)  [address], [place] [phone]) [phone]  [phone]   [facility] Child Abuse Hotline [phone] or [phone] (After Hours)

## 2013-11-26 NOTE — ED Provider Notes (Signed)
CSN: [id number]     Arrival date & time 11/26/13  1017 History   First MD Initiated Contact with Patient 11/26/13 1044     Chief Complaint  Patient presents with  . Generalized Body Aches  . Sickle Cell Pain Crisis      HPI Sickle cell anemic, c/o generalized body aches for 3 days, went to [facility] this past Tuesday and was given shot in bottom but it did not help, is also c/o abdominal pain that is stabbing (upper left, mid, and right). States this pain is unusual for when she has had sickle cell crisis. Denies nausea, vomiting, diarrhea, blood in stool. States she also has a headache for the same amount of time, 8/10 in severity  Past Medical History  Diagnosis Date  . Anemia   . Back pain, chronic   . Hypertension   . Sickle cell anemia    History reviewed. No pertinent past surgical history. No family history on file. History  Substance Use Topics  . Smoking status: Never Smoker   . Smokeless tobacco: Not on file  . Alcohol Use: No   OB History   Grav Para Term Preterm Abortions TAB SAB Ect Mult Living                 Review of Systems  All other systems reviewed and are negative   Allergies  Oxycodone and Vicodin  Home Medications   Prior to Admission medications   Medication Sig Start Date End Date Taking? Authorizing Provider  gabapentin (NEURONTIN) 100 MG capsule Take 1 capsule (100 mg total) by mouth 3 (three) times daily. 10/13/13  Yes Doris Cheadle, MD  losartan (COZAAR) 100 MG tablet Take 1 tablet (100 mg total) by mouth daily. 11/17/13  Yes Doris Cheadle, MD  meclizine (ANTIVERT) 50 MG tablet Take 50 mg by mouth 3 (three) times daily as needed for dizziness or nausea.   Yes Historical Provider, MD  omeprazole (PRILOSEC) 40 MG capsule Take 1 capsule (40 mg total) by mouth daily. 11/17/13  Yes Deepak Advani, MD  ondansetron (ZOFRAN) 4 MG tablet Take 1 tablet (4 mg total) by mouth every 8 (eight) hours as needed for nausea. 03/25/13  Yes Dorothea Ogle, MD  traMADol  (ULTRAM) 50 MG tablet Take 50 mg by mouth every 6 (six) hours as needed for moderate pain.   Yes Historical Provider, MD  HYDROmorphone (DILAUDID) 2 MG tablet Take 1 tablet (2 mg total) by mouth every 4 (four) hours as needed for severe pain. 11/26/13   Nelia Shi, MD   BP 123/76  Pulse 101  Temp(Src) 98.4 F (36.9 C) (Oral)  Resp 18  Ht 5\' 4"  (1.626 m)  Wt 160 lb (72.576 kg)  BMI 27.45 kg/m2  SpO2 100% Physical Exam  Nursing note and vitals reviewed. Constitutional: She is oriented to person, place, and time. She appears well-developed and well-nourished. No distress.  HENT:  Head: Normocephalic and atraumatic.  Eyes: Pupils are equal, round, and reactive to light.  Neck: Normal range of motion.  Cardiovascular: Normal rate and intact distal pulses.   Pulmonary/Chest: No respiratory distress.  Abdominal: Normal appearance. She exhibits no distension.  Musculoskeletal: Normal range of motion.  Neurological: She is alert and oriented to person, place, and time. No cranial nerve deficit.  Skin: Skin is warm and dry. No rash noted.  Psychiatric: She has a normal mood and affect. Her behavior is normal.    ED Course  Procedures (including critical care  time) Medications  fentaNYL (SUBLIMAZE) injection 100 mcg (100 mcg Intravenous Given 11/26/13 1353)  sodium chloride 0.9 % bolus 1,000 mL (0 mLs Intravenous Stopped 11/26/13 1513)   After treatment in the ED the patient feels back to baseline and wants to go home. Labs Reviewed  CBC WITH DIFFERENTIAL - Abnormal; Notable for the following:    Hemoglobin 10.1 (*)    HCT 27.4 (*)    MCV 70.8 (*)    MCHC 36.9 (*)    RDW 18.0 (*)    Platelets 135 (*)    Neutrophils Relative % 79 (*)    All other components within normal limits  COMPREHENSIVE METABOLIC PANEL - Abnormal; Notable for the following:    Glucose, Bld 160 (*)    GFR calc non Af Amer 75 (*)    GFR calc Af Amer 87 (*)    All other components within normal limits   RETICULOCYTES    Imaging Review No results found.   EKG Interpretation   Date/Time:  Thursday Nov 26 2013 10:37:06 EDT Ventricular Rate:  105 PR Interval:  160 QRS Duration: 74 QT Interval:  334 QTC Calculation: 441 R Axis:   32 Text Interpretation:  Sinus tachycardia Consider right atrial enlargement  Confirmed by Radford PaxBEATON  MD, Yared Susan ([id number]) on 11/26/2013 3:14:37 PM      MDM   Final diagnoses:  Sickle cell crisis        Nelia Shiobert L Todd Jelinski, MD 11/26/13 1515

## 2013-11-26 NOTE — Discharge Planning (Signed)
[facility]  Patient states she is established with care at the [facility] and has an upcoming appointment with the practice. Resource guide and my contact information was provided for any future questions or concerns.

## 2013-11-27 NOTE — ED Provider Notes (Signed)
Medical screening examination/treatment/procedure(s) were performed by resident physician or non-physician practitioner and as supervising physician I was immediately available for consultation/collaboration.   Barkley Bruns MD.   Linna Hoff, MD 11/27/13 1004

## 2013-12-02 ENCOUNTER — Other Ambulatory Visit: Payer: Self-pay | Admitting: *Deleted

## 2013-12-02 DIAGNOSIS — M549 Dorsalgia, unspecified: Secondary | ICD-10-CM

## 2013-12-03 MED ORDER — TRAMADOL HCL 50 MG PO TABS
50.0000 mg | ORAL_TABLET | Freq: Four times a day (QID) | ORAL | Status: DC | PRN
Start: ? — End: 2014-04-13

## 2013-12-07 ENCOUNTER — Telehealth: Payer: Self-pay

## 2013-12-07 NOTE — Telephone Encounter (Signed)
Elizabeth from pharmacy called to make [place] aware that patient  Was seen in the ED for sickle cell crisis and was given a prescription For Dilaudid in addition to her tramadol RX

## 2013-12-22 ENCOUNTER — Telehealth: Payer: Self-pay | Admitting: *Deleted

## 2013-12-22 ENCOUNTER — Encounter (HOSPITAL_COMMUNITY): Payer: Self-pay | Admitting: Emergency Medicine

## 2013-12-22 ENCOUNTER — Emergency Department (INDEPENDENT_AMBULATORY_CARE_PROVIDER_SITE_OTHER)
Admission: EM | Admit: 2013-12-22 | Discharge: 2013-12-22 | Disposition: A | Discharge status: Home / Self Care | Payer: Self-pay | Source: Home / Self Care | Attending: Family Medicine | Admitting: Family Medicine

## 2013-12-22 DIAGNOSIS — D571 Sickle-cell disease without crisis: Secondary | ICD-10-CM

## 2013-12-22 MED ORDER — TRAMADOL HCL 50 MG PO TABS: 50.0000 mg | ORAL_TABLET | Freq: Four times a day (QID) | ORAL | Status: DC | PRN

## 2013-12-22 NOTE — ED Notes (Signed)
Pt c/o generalized joint pain onset this am Needing refill on her tramadol Called PCP but they can not see her Alert w/no signs of acute distress.

## 2013-12-22 NOTE — ED Provider Notes (Signed)
CSN: [id number]     Arrival date & time 12/22/13  0912 History   First MD Initiated Contact with Patient 12/22/13 [phone]     Chief Complaint  Patient presents with  . Joint Pain   (Consider location/radiation/quality/duration/timing/severity/associated sxs/prior Treatment) Patient is a 58 y.o. female presenting with back pain.  Back Pain Location:  Generalized Quality:  Aching Radiates to:  Does not radiate Pain severity:  Mild Chronicity:  Chronic Context comment:  Has sickle cell and comes from community [facility] for refill on trmadol , no appt at [facility] with dr dean until 7/14.   Past Medical History  Diagnosis Date  . Anemia   . Back pain, chronic   . Hypertension   . Sickle cell anemia    History reviewed. No pertinent past surgical history. No family history on file. History  Substance Use Topics  . Smoking status: Never Smoker   . Smokeless tobacco: Not on file  . Alcohol Use: No   OB History   Grav Para Term Preterm Abortions TAB SAB Ect Mult Living                 Review of Systems  Constitutional: Negative.   Gastrointestinal: Negative.   Genitourinary: Negative.   Musculoskeletal: Positive for arthralgias, back pain and myalgias.  Skin: Negative.     Allergies  Oxycodone and Vicodin  Home Medications   Prior to Admission medications   Medication Sig Start Date End Date Taking? Authorizing Provider  losartan (COZAAR) 100 MG tablet Take 1 tablet (100 mg total) by mouth daily. 11/17/13  Yes Doris Cheadleeepak Advani, MD  omeprazole (PRILOSEC) 40 MG capsule Take 1 capsule (40 mg total) by mouth daily. 11/17/13  Yes Doris Cheadleeepak Advani, MD  traMADol (ULTRAM) 50 MG tablet Take 1 tablet (50 mg total) by mouth every 6 (six) hours as needed for moderate pain.   Yes Doris Cheadleeepak Advani, MD  gabapentin (NEURONTIN) 100 MG capsule Take 1 capsule (100 mg total) by mouth 3 (three) times daily. 10/13/13   Doris Cheadleeepak Advani, MD  HYDROmorphone (DILAUDID) 2 MG tablet Take 1 tablet (2 mg total) by mouth  every 4 (four) hours as needed for severe pain. 11/26/13   Nelia Shiobert L Beaton, MD  meclizine (ANTIVERT) 50 MG tablet Take 50 mg by mouth 3 (three) times daily as needed for dizziness or nausea.    Historical Provider, MD  ondansetron (ZOFRAN) 4 MG tablet Take 1 tablet (4 mg total) by mouth every 8 (eight) hours as needed for nausea. 03/25/13   Dorothea OgleIskra M Myers, MD  traMADol (ULTRAM) 50 MG tablet Take 1 tablet (50 mg total) by mouth every 6 (six) hours as needed. For pain 12/22/13   Linna HoffJames D Kindl, MD   BP 137/90  Pulse 73  Temp(Src) 98.4 F (36.9 C) (Oral)  Resp 18  SpO2 99% Physical Exam  Nursing note and vitals reviewed. Constitutional: She is oriented to person, place, and time. She appears well-developed and well-nourished. No distress.  Neck: Normal range of motion. Neck supple.  Cardiovascular: Normal heart sounds.   Pulmonary/Chest: Breath sounds normal.  Abdominal: Soft. Bowel sounds are normal.  Musculoskeletal: She exhibits tenderness.  Lymphadenopathy:    She has no cervical adenopathy.  Neurological: She is alert and oriented to person, place, and time.  Skin: Skin is warm and dry.    ED Course  Procedures (including critical care time) Labs Review Labs Reviewed - No data to display  Imaging Review No results found.   MDM  1. Sickle cell anemia without crisis        Linna HoffJames D Kindl, MD 12/22/13 1005

## 2013-12-22 NOTE — Discharge Instructions (Signed)
See your doctor as planned. °

## 2013-12-22 NOTE — Telephone Encounter (Signed)
Pt. Is requesting tramadol refill, I informed patient it could take up to 48 hrs for approval to be processed...pt. Stated she cannot wait and would go to the Urgent Care....Marland Kitchen..Marland Kitchen

## 2014-01-05 ENCOUNTER — Ambulatory Visit: Payer: Self-pay | Attending: Internal Medicine

## 2014-01-05 DIAGNOSIS — D571 Sickle-cell disease without crisis: Secondary | ICD-10-CM

## 2014-01-05 DIAGNOSIS — Z139 Encounter for screening, unspecified: Secondary | ICD-10-CM

## 2014-01-05 LAB — COMPLETE METABOLIC PANEL WITH GFR
ALBUMIN: 4.4 g/dL (ref 3.5–5.2)
ALK PHOS: 77 U/L (ref 39–117)
ALT: 13 U/L (ref 0–35)
AST: 15 U/L (ref 0–37)
BUN: 7 mg/dL (ref 6–23)
CO2: 26 mEq/L (ref 19–32)
CREATININE: 0.71 mg/dL (ref 0.50–1.10)
Calcium: 9.3 mg/dL (ref 8.4–10.5)
Chloride: 105 mEq/L (ref 96–112)
GFR, Est African American: >89 mL/min
GLUCOSE: 105 mg/dL — AB (ref 70–99)
POTASSIUM: 4.3 meq/L (ref 3.5–5.3)
Sodium: 140 mEq/L (ref 135–145)
Total Bilirubin: 0.9 mg/dL (ref 0.2–1.2)
Total Protein: 7.1 g/dL (ref 6.0–8.3)

## 2014-01-05 LAB — CBC WITH DIFFERENTIAL/PLATELET
BASOS ABS: 0 10*3/uL (ref 0.0–0.1)
Basophils Relative: 1 % (ref 0–1)
EOS ABS: 0 10*3/uL (ref 0.0–0.7)
Eosinophils Relative: 1 % (ref 0–5)
HCT: 30.9 % — ABNORMAL LOW (ref 36.0–46.0)
Hemoglobin: 10.7 g/dL — ABNORMAL LOW (ref 12.0–15.0)
Lymphocytes Relative: 35 % (ref 12–46)
Lymphs Abs: 1.2 10*3/uL (ref 0.7–4.0)
MCH: 25.4 pg — AB (ref 26.0–34.0)
MCHC: 34.6 g/dL (ref 30.0–36.0)
MCV: 73.2 fL — ABNORMAL LOW (ref 78.0–100.0)
MONO ABS: 0.2 10*3/uL (ref 0.1–1.0)
Monocytes Relative: 6 % (ref 3–12)
NEUTROS ABS: 2 10*3/uL (ref 1.7–7.7)
Neutrophils Relative %: 57 % (ref 43–77)
Platelets: 154 10*3/uL (ref 150–400)
RBC: 4.22 MIL/uL (ref 3.87–5.11)
RDW: 18.7 % — AB (ref 11.5–15.5)
WBC: 3.5 10*3/uL — ABNORMAL LOW (ref 4.0–10.5)

## 2014-01-05 LAB — LIPID PANEL
CHOLESTEROL: 162 mg/dL (ref 0–200)
HDL: 51 mg/dL (ref >39)
LDL Cholesterol: 102 mg/dL — ABNORMAL HIGH (ref 0–99)
TRIGLYCERIDES: 46 mg/dL (ref <150)
Total CHOL/HDL Ratio: 3.2 Ratio
VLDL: 9 mg/dL (ref 0–40)

## 2014-01-05 LAB — TSH: TSH: 1.883 u[IU]/mL (ref 0.350–4.500)

## 2014-01-06 ENCOUNTER — Telehealth: Payer: Self-pay

## 2014-01-06 LAB — VITAMIN D 25 HYDROXY (VIT D DEFICIENCY, FRACTURES): Vit D, 25-Hydroxy: 31 ng/mL (ref 30–89)

## 2014-01-06 NOTE — Telephone Encounter (Signed)
Patient is aware of her lab results 

## 2014-01-06 NOTE — Telephone Encounter (Signed)
Message copied by Lestine MountJUAREZ, Keigo Whalley L on Wed Jan 06, 2014 12:07 PM ------      Message from: Doris CheadleADVANI, DEEPAK      Created: Wed Jan 06, 2014  9:20 AM       Blood work reviewed noticed impaired fasting glucose, call and advise patient for low carbohydrate diet.      Anemia is improved advised patient to take over-the-counter iron and folic acid supplement. ------

## 2014-01-12 ENCOUNTER — Encounter: Payer: Self-pay | Admitting: *Deleted

## 2014-01-12 DIAGNOSIS — Z599 Problem related to housing and economic circumstances, unspecified: Secondary | ICD-10-CM

## 2014-01-12 NOTE — Progress Notes (Signed)
LCSW met with patient in order to provide navigation and support.  Patient presented needing support with medication.  LCSW directed patient to return for walk in orange card renewal on Thursday or Friday.  This will allow more support with visits and medication. LCSW also encouraged patient to return for her visit on tomorrow.  LCSW also contacted case manager with [facility] in order to support her moving forward with her green card/citizenship needs.   Christene Lye MSW, Luciana.[email or web address] ]

## 2014-01-13 ENCOUNTER — Ambulatory Visit: Payer: Self-pay | Attending: Internal Medicine | Admitting: Internal Medicine

## 2014-01-13 ENCOUNTER — Encounter: Payer: Self-pay | Admitting: Internal Medicine

## 2014-01-13 VITALS — BP 146/92 | HR 70 | Temp 98.1°F | Resp 16 | Wt 169.2 lb

## 2014-01-13 DIAGNOSIS — M549 Dorsalgia, unspecified: Secondary | ICD-10-CM

## 2014-01-13 DIAGNOSIS — K219 Gastro-esophageal reflux disease without esophagitis: Secondary | ICD-10-CM

## 2014-01-13 DIAGNOSIS — I10 Essential (primary) hypertension: Secondary | ICD-10-CM

## 2014-01-13 DIAGNOSIS — D571 Sickle-cell disease without crisis: Secondary | ICD-10-CM

## 2014-01-13 MED ORDER — TRAMADOL HCL 50 MG PO TABS: 50.0000 mg | ORAL_TABLET | Freq: Two times a day (BID) | ORAL | Status: DC | PRN

## 2014-01-13 NOTE — Progress Notes (Signed)
MRN: [id number] Name: Kayla Woodard  Sex: female Age: 58 y.o. DOB: 10-16-1955  Allergies: Oxycodone and Vicodin  Chief Complaint  Patient presents with  . Follow-up    HPI: Patient is 58 y.o. female who has to of hypertension GERD chronic back pain sickle cell anemia comes today for followup, recently she had a blood work done which was reviewed noticed improvement in anemia, she has been advised her to take iron supplement and folic acid, she also reported to have noticed some mouth ulcers a few days ago, denies any fever chills sore throat her chest and shortness of breath, she has chronic back pain requesting refill on tramadol.   Past Medical History  Diagnosis Date  . Anemia   . Back pain, chronic   . Hypertension   . Sickle cell anemia     History reviewed. No pertinent past surgical history.    Medication List       This list is accurate as of: 01/13/14 10:56 AM.  Always use your most recent med list.               gabapentin 100 MG capsule  Commonly known as:  NEURONTIN  Take 1 capsule (100 mg total) by mouth 3 (three) times daily.     HYDROmorphone 2 MG tablet  Commonly known as:  DILAUDID  Take 1 tablet (2 mg total) by mouth every 4 (four) hours as needed for severe pain.     losartan 100 MG tablet  Commonly known as:  COZAAR  Take 1 tablet (100 mg total) by mouth daily.     meclizine 50 MG tablet  Commonly known as:  ANTIVERT  Take 50 mg by mouth 3 (three) times daily as needed for dizziness or nausea.     omeprazole 40 MG capsule  Commonly known as:  PRILOSEC  Take 1 capsule (40 mg total) by mouth daily.     ondansetron 4 MG tablet  Commonly known as:  ZOFRAN  Take 1 tablet (4 mg total) by mouth every 8 (eight) hours as needed for nausea.     traMADol 50 MG tablet  Commonly known as:  ULTRAM  Take 1 tablet (50 mg total) by mouth every 6 (six) hours as needed for moderate pain.     traMADol 50 MG tablet  Commonly known as:  ULTRAM  Take  1 tablet (50 mg total) by mouth every 12 (twelve) hours as needed. For pain        Meds ordered this encounter  Medications  . traMADol (ULTRAM) 50 MG tablet    Sig: Take 1 tablet (50 mg total) by mouth every 12 (twelve) hours as needed. For pain    Dispense:  60 tablet    Refill:  0    Immunization History  Administered Date(s) Administered  . Influenza Whole 04/01/2009, 06/05/2010  . Td 07/07/2007    History reviewed. No pertinent family history.  History  Substance Use Topics  . Smoking status: Never Smoker   . Smokeless tobacco: Not on file  . Alcohol Use: No    Review of Systems   As noted in HPI  Filed Vitals:   01/13/14 1028  BP: 146/92  Pulse: 70  Temp: 98.1 F (36.7 C)  Resp: 16    Physical Exam  Physical Exam  Constitutional: No distress.  HENT:  No apparent ulcers noticed on tongue or buccal mucosa   Eyes: EOM are normal. Pupils are equal, round, and reactive to  light.  Cardiovascular: Normal rate and regular rhythm.   Pulmonary/Chest: Breath sounds normal. No respiratory distress. She has no wheezes. She has no rales.    CBC    Component Value Date/Time   WBC 3.5* 01/05/2014 0912   WBC 4.9 11/21/2010 1336   RBC 4.22 01/05/2014 0912   RBC 3.87 11/26/2013 1047   RBC 3.82 11/21/2010 1336   HGB 10.7* 01/05/2014 0912   HGB 10.2* 11/21/2010 1336   HCT 30.9* 01/05/2014 0912   HCT 29.2* 11/21/2010 1336   PLT 154 01/05/2014 0912   PLT 149 11/21/2010 1336   MCV 73.2* 01/05/2014 0912   MCV 76.3* 11/21/2010 1336   LYMPHSABS 1.2 01/05/2014 0912   LYMPHSABS 1.6 11/21/2010 1336   MONOABS 0.2 01/05/2014 0912   MONOABS 0.3 11/21/2010 1336   EOSABS 0.0 01/05/2014 0912   EOSABS 0.1 11/21/2010 1336   BASOSABS 0.0 01/05/2014 0912   BASOSABS 0.0 11/21/2010 1336    CMP     Component Value Date/Time   NA 140 01/05/2014 0912   K 4.3 01/05/2014 0912   CL 105 01/05/2014 0912   CO2 26 01/05/2014 0912   GLUCOSE 105* 01/05/2014 0912   BUN 7 01/05/2014 0912   CREATININE 0.71 01/05/2014 0912     CREATININE 0.85 11/26/2013 1047   CALCIUM 9.3 01/05/2014 0912   PROT 7.1 01/05/2014 0912   ALBUMIN 4.4 01/05/2014 0912   AST 15 01/05/2014 0912   ALT 13 01/05/2014 0912   ALKPHOS 77 01/05/2014 0912   BILITOT 0.9 01/05/2014 0912   GFRNONAA >89 01/05/2014 0912   GFRNONAA 75* 11/26/2013 1047   GFRAA >89 01/05/2014 0912   GFRAA 87* 11/26/2013 1047    Lab Results  Component Value Date/Time   CHOL 162 01/05/2014  9:12 AM    No components found with this basename: hga1c    Lab Results  Component Value Date/Time   AST 15 01/05/2014  9:12 AM    Assessment and Plan  Backache, unspecified - Plan: traMADol (ULTRAM) 50 MG tablet when necessary for pain patient has already been prescribed Neurontin but as per patient it does not help her with the symptoms.  Gastroesophageal reflux disease without esophagitis Continue with Prilosec 40 mg daily. Lifestyle modification.   Unspecified essential hypertension Blood pressure is borderline elevated advised for DASH diet continue with Cozaar 100 mg daily.  Sickle-cell disease, unspecified Advised patient to continue with iron supplement and folic acid. Her    Return in about 3 months (around 04/15/2014) for hypertension, anemia.  Doris CheadleADVANI, Mikko Lewellen, MD

## 2014-01-13 NOTE — Progress Notes (Signed)
Patient here for follow up on her HTN Complains of having sores to the inside of her mouth Needs tramadol refilled

## 2014-01-13 NOTE — Patient Instructions (Signed)
DASH Eating Plan  DASH stands for "Dietary Approaches to Stop Hypertension." The DASH eating plan is a healthy eating plan that has been shown to reduce high blood pressure (hypertension). Additional health benefits may include reducing the risk of type 2 diabetes mellitus, heart disease, and stroke. The DASH eating plan may also help with weight loss.  WHAT DO I NEED TO KNOW ABOUT THE DASH EATING PLAN?  For the DASH eating plan, you will follow these general guidelines:  · Choose foods with a percent daily value for sodium of less than 5% (as listed on the food label).  · Use salt-free seasonings or herbs instead of table salt or sea salt.  · Check with your health care provider or pharmacist before using salt substitutes.  · Eat lower-sodium products, often labeled as "lower sodium" or "no salt added."  · Eat fresh foods.  · Eat more vegetables, fruits, and low-fat dairy products.  · Choose whole grains. Look for the word "whole" as the first word in the ingredient list.  · Choose fish and skinless chicken or turkey more often than red meat. Limit fish, poultry, and meat to 6 oz (170 g) each day.  · Limit sweets, desserts, sugars, and sugary drinks.  · Choose heart-healthy fats.  · Limit cheese to 1 oz (28 g) per day.  · Eat more home-cooked food and less restaurant, buffet, and fast food.  · Limit fried foods.  · Cook foods using methods other than frying.  · Limit canned vegetables. If you do use them, rinse them well to decrease the sodium.  · When eating at a restaurant, ask that your food be prepared with less salt, or no salt if possible.  WHAT FOODS CAN I EAT?  Seek help from a dietitian for individual calorie needs.  Grains  Whole grain or whole wheat bread. Brown rice. Whole grain or whole wheat pasta. Quinoa, bulgur, and whole grain cereals. Low-sodium cereals. Corn or whole wheat flour tortillas. Whole grain cornbread. Whole grain crackers. Low-sodium crackers.  Vegetables  Fresh or frozen vegetables  (raw, steamed, roasted, or grilled). Low-sodium or reduced-sodium tomato and vegetable juices. Low-sodium or reduced-sodium tomato sauce and paste. Low-sodium or reduced-sodium canned vegetables.   Fruits  All fresh, canned (in natural juice), or frozen fruits.  Meat and Other Protein Products  Ground beef (85% or leaner), grass-fed beef, or beef trimmed of fat. Skinless chicken or turkey. Ground chicken or turkey. Pork trimmed of fat. All fish and seafood. Eggs. Dried beans, peas, or lentils. Unsalted nuts and seeds. Unsalted canned beans.  Dairy  Low-fat dairy products, such as skim or 1% milk, 2% or reduced-fat cheeses, low-fat ricotta or cottage cheese, or plain low-fat yogurt. Low-sodium or reduced-sodium cheeses.  Fats and Oils  Tub margarines without trans fats. Light or reduced-fat mayonnaise and salad dressings (reduced sodium). Avocado. Safflower, olive, or canola oils. Natural peanut or almond butter.  Other  Unsalted popcorn and pretzels.  The items listed above may not be a complete list of recommended foods or beverages. Contact your dietitian for more options.  WHAT FOODS ARE NOT RECOMMENDED?  Grains  White bread. White pasta. White rice. Refined cornbread. Bagels and croissants. Crackers that contain trans fat.  Vegetables  Creamed or fried vegetables. Vegetables in a cheese sauce. Regular canned vegetables. Regular canned tomato sauce and paste. Regular tomato and vegetable juices.  Fruits  Dried fruits. Canned fruit in light or heavy syrup. Fruit juice.  Meat and Other Protein   Products  Fatty cuts of meat. Ribs, chicken wings, bacon, sausage, bologna, salami, chitterlings, fatback, hot dogs, bratwurst, and packaged luncheon meats. Salted nuts and seeds. Canned beans with salt.  Dairy  Whole or 2% milk, cream, half-and-half, and cream cheese. Whole-fat or sweetened yogurt. Full-fat cheeses or blue cheese. Nondairy creamers and whipped toppings. Processed cheese, cheese spreads, or cheese  curds.  Condiments  Onion and garlic salt, seasoned salt, table salt, and sea salt. Canned and packaged gravies. Worcestershire sauce. Tartar sauce. Barbecue sauce. Teriyaki sauce. Soy sauce, including reduced sodium. Steak sauce. Fish sauce. Oyster sauce. Cocktail sauce. Horseradish. Ketchup and mustard. Meat flavorings and tenderizers. Bouillon cubes. Hot sauce. Tabasco sauce. Marinades. Taco seasonings. Relishes.  Fats and Oils  Butter, stick margarine, lard, shortening, ghee, and bacon fat. Coconut, palm kernel, or palm oils. Regular salad dressings.  Other  Pickles and olives. Salted popcorn and pretzels.  The items listed above may not be a complete list of foods and beverages to avoid. Contact your dietitian for more information.  WHERE CAN I FIND MORE INFORMATION?  National Heart, Lung, and Blood Institute: www.nhlbi.nih.gov/health/health-topics/topics/dash/  Document Released: 06/07/2011 Document Revised: 06/23/2013 Document Reviewed: 04/22/2013  Exit[employer]® Patient Information ©2015 [employer]. This information is not intended to replace advice given to you by your health care provider. Make sure you discuss any questions you have with your health care provider.

## 2014-02-15 ENCOUNTER — Telehealth: Payer: Self-pay | Admitting: *Deleted

## 2014-02-15 NOTE — Telephone Encounter (Signed)
Patient called stating that she is out of Tramadol and need another refill.

## 2014-02-16 ENCOUNTER — Telehealth: Payer: Self-pay

## 2014-02-16 DIAGNOSIS — M549 Dorsalgia, unspecified: Secondary | ICD-10-CM

## 2014-02-16 NOTE — Telephone Encounter (Signed)
Patient can  be given refill on tramadol. 

## 2014-02-16 NOTE — Telephone Encounter (Signed)
Patient requesting refill on her tramadol Can we refill this?

## 2014-02-18 ENCOUNTER — Encounter (HOSPITAL_COMMUNITY): Payer: Self-pay | Admitting: Emergency Medicine

## 2014-02-18 ENCOUNTER — Emergency Department (INDEPENDENT_AMBULATORY_CARE_PROVIDER_SITE_OTHER)
Admission: EM | Admit: 2014-02-18 | Discharge: 2014-02-18 | Disposition: A | Discharge status: Home / Self Care | Payer: PRIVATE HEALTH INSURANCE | Source: Home / Self Care | Attending: Family Medicine | Admitting: Family Medicine

## 2014-02-18 DIAGNOSIS — D571 Sickle-cell disease without crisis: Secondary | ICD-10-CM

## 2014-02-18 DIAGNOSIS — R1084 Generalized abdominal pain: Secondary | ICD-10-CM

## 2014-02-18 LAB — CBC WITH DIFFERENTIAL/PLATELET
Basophils Absolute: 0 10*3/uL (ref 0.0–0.1)
Basophils Relative: 0 % (ref 0–1)
EOS ABS: 0.1 10*3/uL (ref 0.0–0.7)
Eosinophils Relative: 2 % (ref 0–5)
HEMATOCRIT: 28.7 % — AB (ref 36.0–46.0)
Hemoglobin: 10.4 g/dL — ABNORMAL LOW (ref 12.0–15.0)
LYMPHS ABS: 1.6 10*3/uL (ref 0.7–4.0)
LYMPHS PCT: 39 % (ref 12–46)
MCH: 25.6 pg — ABNORMAL LOW (ref 26.0–34.0)
MCHC: 36.2 g/dL — ABNORMAL HIGH (ref 30.0–36.0)
MCV: 70.7 fL — AB (ref 78.0–100.0)
MONO ABS: 0.2 10*3/uL (ref 0.1–1.0)
Monocytes Relative: 4 % (ref 3–12)
Neutro Abs: 2.3 10*3/uL (ref 1.7–7.7)
Neutrophils Relative %: 55 % (ref 43–77)
Platelets: 169 10*3/uL (ref 150–400)
RBC: 4.06 MIL/uL (ref 3.87–5.11)
RDW: 17.9 % — AB (ref 11.5–15.5)
WBC: 4.2 10*3/uL (ref 4.0–10.5)

## 2014-02-18 LAB — POCT URINALYSIS DIP (DEVICE)
Bilirubin Urine: NEGATIVE
Glucose, UA: NEGATIVE mg/dL
Hgb urine dipstick: NEGATIVE
KETONES UR: NEGATIVE mg/dL
Leukocytes, UA: NEGATIVE
Nitrite: NEGATIVE
PH: 6 (ref 5.0–8.0)
Protein, ur: NEGATIVE mg/dL
SPECIFIC GRAVITY, URINE: 1.015 (ref 1.005–1.030)
Urobilinogen, UA: 1 mg/dL (ref 0.0–1.0)

## 2014-02-18 LAB — COMPREHENSIVE METABOLIC PANEL
ALT: 19 U/L (ref 0–35)
AST: 15 U/L (ref 0–37)
Albumin: 4.1 g/dL (ref 3.5–5.2)
Alkaline Phosphatase: 94 U/L (ref 39–117)
Anion gap: 12 (ref 5–15)
BUN: 9 mg/dL (ref 6–23)
CALCIUM: 9.2 mg/dL (ref 8.4–10.5)
CO2: 25 meq/L (ref 19–32)
CREATININE: 0.76 mg/dL (ref 0.50–1.10)
Chloride: 105 mEq/L (ref 96–112)
GFR calc non Af Amer: >90 mL/min (ref >90)
Glucose, Bld: 91 mg/dL (ref 70–99)
Potassium: 3.8 mEq/L (ref 3.7–5.3)
SODIUM: 142 meq/L (ref 137–147)
TOTAL PROTEIN: 7.8 g/dL (ref 6.0–8.3)
Total Bilirubin: 1.1 mg/dL (ref 0.3–1.2)

## 2014-02-18 LAB — LIPASE, BLOOD: LIPASE: 27 U/L (ref 11–59)

## 2014-02-18 MED ORDER — TRAMADOL HCL 50 MG PO TABS: 50.0000 mg | ORAL_TABLET | Freq: Two times a day (BID) | ORAL | Status: DC | PRN

## 2014-02-18 NOTE — Discharge Instructions (Signed)
Take tramadol as needed for the abdominal pain.  Follow up with your primary care provider ASAP.  If worsening, go to the emergency department.   Abdominal Pain, Women Abdominal (stomach, pelvic, or belly) pain can be caused by many things. It is important to tell your doctor:  The location of the pain.  Does it come and go or is it present all the time?  Are there things that start the pain (eating certain foods, exercise)?  Are there other symptoms associated with the pain (fever, nausea, vomiting, diarrhea)? All of this is helpful to know when trying to find the cause of the pain. CAUSES   Stomach: virus or bacteria infection, or ulcer.  Intestine: appendicitis (inflamed appendix), regional ileitis (Crohn's disease), ulcerative colitis (inflamed colon), irritable bowel syndrome, diverticulitis (inflamed diverticulum of the colon), or cancer of the stomach or intestine.  Gallbladder disease or stones in the gallbladder.  Kidney disease, kidney stones, or infection.  Pancreas infection or cancer.  Fibromyalgia (pain disorder).  Diseases of the female organs:  Uterus: fibroid (non-cancerous) tumors or infection.  Fallopian tubes: infection or tubal pregnancy.  Ovary: cysts or tumors.  Pelvic adhesions (scar tissue).  Endometriosis (uterus lining tissue growing in the pelvis and on the pelvic organs).  Pelvic congestion syndrome (female organs filling up with blood just before the menstrual period).  Pain with the menstrual period.  Pain with ovulation (producing an egg).  Pain with an IUD (intrauterine device, birth control) in the uterus.  Cancer of the female organs.  Functional pain (pain not caused by a disease, may improve without treatment).  Psychological pain.  Depression. DIAGNOSIS  Your doctor will decide the seriousness of your pain by doing an examination.  Blood tests.  X-rays.  Ultrasound.  CT scan (computed tomography, special type of  X-ray).  MRI (magnetic resonance imaging).  Cultures, for infection.  Barium enema (dye inserted in the large intestine, to better view it with X-rays).  Colonoscopy (looking in intestine with a lighted tube).  Laparoscopy (minor surgery, looking in abdomen with a lighted tube).  Major abdominal exploratory surgery (looking in abdomen with a large incision). TREATMENT  The treatment will depend on the cause of the pain.   Many cases can be observed and treated at home.  Over-the-counter medicines recommended by your caregiver.  Prescription medicine.  Antibiotics, for infection.  Birth control pills, for painful periods or for ovulation pain.  Hormone treatment, for endometriosis.  Nerve blocking injections.  Physical therapy.  Antidepressants.  Counseling with a psychologist or psychiatrist.  Minor or major surgery. HOME CARE INSTRUCTIONS   Do not take laxatives, unless directed by your caregiver.  Take over-the-counter pain medicine only if ordered by your caregiver. Do not take aspirin because it can cause an upset stomach or bleeding.  Try a clear liquid diet (broth or water) as ordered by your caregiver. Slowly move to a bland diet, as tolerated, if the pain is related to the stomach or intestine.  Have a thermometer and take your temperature several times a day, and record it.  Bed rest and sleep, if it helps the pain.  Avoid sexual intercourse, if it causes pain.  Avoid stressful situations.  Keep your follow-up appointments and tests, as your caregiver orders.  If the pain does not go away with medicine or surgery, you may try:  Acupuncture.  Relaxation exercises (yoga, meditation).  Group therapy.  Counseling. SEEK MEDICAL CARE IF:   You notice certain foods cause stomach pain.  Your home care treatment is not helping your pain.  You need stronger pain medicine.  You want your IUD removed.  You feel faint or lightheaded.  You  develop nausea and vomiting.  You develop a rash.  You are having side effects or an allergy to your medicine. SEEK IMMEDIATE MEDICAL CARE IF:   Your pain does not go away or gets worse.  You have a fever.  Your pain is felt only in portions of the abdomen. The right side could possibly be appendicitis. The left lower portion of the abdomen could be colitis or diverticulitis.  You are passing blood in your stools (bright red or black tarry stools, with or without vomiting).  You have blood in your urine.  You develop chills, with or without a fever.  You pass out. MAKE SURE YOU:   Understand these instructions.  Will watch your condition.  Will get help right away if you are not doing well or get worse. Document Released: 04/15/2007 Document Revised: 11/02/2013 Document Reviewed: 05/05/2009 [facility] Patient Information 2015 [place], [place]. This information is not intended to replace advice given to you by your health care provider. Make sure you discuss any questions you have with your health care provider.

## 2014-02-18 NOTE — Telephone Encounter (Signed)
Patient here for refill on her tramadol Per Dr Orpah CobbAdvani we can refill it

## 2014-02-18 NOTE — Addendum Note (Signed)
Addended by: Lestine MountJUAREZ, Deanta Mincey L on: 02/18/2014 10:55 AM   Modules accepted: Orders

## 2014-02-18 NOTE — ED Notes (Signed)
Pt  Reports     Low   abd  Pain        X  8  Days              denys  Any  Urinary  Symptoms   denys s  Any  Vomiting   Diarrhea      Pt  Has  A  History  Of  Sickle  Cell  Disease        Pt  Ambulated  To  Room  With a  Fluid  Steady  Gait

## 2014-02-18 NOTE — ED Provider Notes (Signed)
CSN: [id number]     Arrival date & time 02/18/14  1136 History   First MD Initiated Contact with Patient 02/18/14 1216     Chief Complaint  Patient presents with  . Abdominal Pain   (Consider location/radiation/quality/duration/timing/severity/associated sxs/prior Treatment) HPI Comments: 58 year old female with history of sickle cell anemia, with frequent pain crises, presents complaining of abdominal pain. She has abdominal pain for 8 days, diffuse, spread out about her abdomen. The pain is constant and nonradiating. It is not associated with fever, nausea, vomiting, diarrhea, constipation, hematuria, dysuria, vaginal bleeding, vaginal discharge. She denies any risk of STDs or pregnancy. She has Rx of tramadol midline scar from the suprapubic area    Patient is a 57 y.o. female presenting with abdominal pain.  Abdominal Pain Associated symptoms: no diarrhea, no nausea and no vomiting     Past Medical History  Diagnosis Date  . Anemia   . Back pain, chronic   . Hypertension   . Sickle cell anemia    History reviewed. No pertinent past surgical history. No family history on file. History  Substance Use Topics  . Smoking status: Never Smoker   . Smokeless tobacco: Not on file  . Alcohol Use: No   OB History   Grav Para Term Preterm Abortions TAB SAB Ect Mult Living                 Review of Systems  Gastrointestinal: Positive for abdominal pain. Negative for nausea, vomiting and diarrhea.  All other systems reviewed and are negative.   Allergies  Oxycodone and Vicodin  Home Medications   Prior to Admission medications   Medication Sig Start Date End Date Taking? Authorizing Provider  gabapentin (NEURONTIN) 100 MG capsule Take 1 capsule (100 mg total) by mouth 3 (three) times daily. 10/13/13   Doris Cheadle, MD  HYDROmorphone (DILAUDID) 2 MG tablet Take 1 tablet (2 mg total) by mouth every 4 (four) hours as needed for severe pain. 11/26/13   Nelia Shi, MD  losartan  (COZAAR) 100 MG tablet Take 1 tablet (100 mg total) by mouth daily. 11/17/13   Doris Cheadle, MD  meclizine (ANTIVERT) 50 MG tablet Take 50 mg by mouth 3 (three) times daily as needed for dizziness or nausea.    Historical Provider, MD  omeprazole (PRILOSEC) 40 MG capsule Take 1 capsule (40 mg total) by mouth daily. 11/17/13   Doris Cheadle, MD  ondansetron (ZOFRAN) 4 MG tablet Take 1 tablet (4 mg total) by mouth every 8 (eight) hours as needed for nausea. 03/25/13   Dorothea Ogle, MD  traMADol (ULTRAM) 50 MG tablet Take 1 tablet (50 mg total) by mouth every 6 (six) hours as needed for moderate pain.    Doris Cheadle, MD  traMADol (ULTRAM) 50 MG tablet Take 1 tablet (50 mg total) by mouth every 12 (twelve) hours as needed. For pain 02/18/14   Doris Cheadle, MD   BP 138/83  Pulse 64  Temp(Src) 97 F (36.1 C) (Oral)  Resp 16  SpO2 99% Physical Exam  Nursing note and vitals reviewed. Constitutional: She is oriented to person, place, and time. Vital signs are normal. She appears well-developed and well-nourished. No distress.  HENT:  Head: Normocephalic and atraumatic.  Cardiovascular: Normal rate, regular rhythm and normal heart sounds.   Pulmonary/Chest: Effort normal and breath sounds normal. No respiratory distress.  Abdominal: Bowel sounds are normal. She exhibits no distension and no mass. There is no hepatosplenomegaly. There is generalized tenderness.  There is no rigidity, no rebound, no guarding, no CVA tenderness, no tenderness at McBurney's point and negative Murphy's sign. No hernia.  Midline scar from the suprapubic area up to the umbilicus  Neurological: She is alert and oriented to person, place, and time. She has normal strength. Coordination normal.  Skin: Skin is warm and dry. No rash noted. She is not diaphoretic.  Psychiatric: She has a normal mood and affect. Judgment normal.    ED Course  Procedures (including critical care time) Labs Review Labs Reviewed  CBC WITH  DIFFERENTIAL - Abnormal; Notable for the following:    Hemoglobin 10.4 (*)    HCT 28.7 (*)    MCV 70.7 (*)    MCH 25.6 (*)    MCHC 36.2 (*)    RDW 17.9 (*)    All other components within normal limits  COMPREHENSIVE METABOLIC PANEL  LIPASE, BLOOD  POCT URINALYSIS DIP (DEVICE)    Imaging Review No results found.   MDM   1. Generalized abdominal pain   2. Sickle cell disease without crisis    Labs normal. She gets relief from her tramadol, continue tramadol. Followup with primary care as needed. Emergency department if worsening.       Graylon GoodZachary H Anatalia Kronk, PA-C 02/18/14 1414

## 2014-02-18 NOTE — ED Notes (Signed)
Present for chaperone during abdominal exam

## 2014-02-19 NOTE — ED Provider Notes (Signed)
Medical screening examination/treatment/procedure(s) were performed by a resident physician or non-physician practitioner and as the supervising physician I was immediately available for consultation/collaboration.  David Merrell, MD Family Medicine   David J Merrell, MD 02/19/14 2215 

## 2014-03-02 ENCOUNTER — Other Ambulatory Visit: Payer: Self-pay | Admitting: Internal Medicine

## 2014-03-02 DIAGNOSIS — Z1231 Encounter for screening mammogram for malignant neoplasm of breast: Secondary | ICD-10-CM

## 2014-03-15 ENCOUNTER — Telehealth: Payer: Self-pay | Admitting: Internal Medicine

## 2014-03-15 ENCOUNTER — Telehealth: Payer: Self-pay | Admitting: Emergency Medicine

## 2014-03-15 MED ORDER — TRAMADOL HCL 50 MG PO TABS: 50.0000 mg | ORAL_TABLET | Freq: Four times a day (QID) | ORAL | Status: DC | PRN

## 2014-03-15 NOTE — Telephone Encounter (Signed)
Pt call requesting refill on tramadol. Pls contact pt.

## 2014-03-23 ENCOUNTER — Ambulatory Visit (HOSPITAL_COMMUNITY)
Admission: RE | Admit: 2014-03-23 | Discharge: 2014-03-23 | Disposition: A | Discharge status: Home / Self Care | Payer: PRIVATE HEALTH INSURANCE | Source: Ambulatory Visit | Attending: Internal Medicine | Admitting: Internal Medicine

## 2014-03-23 DIAGNOSIS — Z1231 Encounter for screening mammogram for malignant neoplasm of breast: Secondary | ICD-10-CM | POA: Insufficient documentation

## 2014-03-25 ENCOUNTER — Telehealth: Payer: Self-pay | Admitting: *Deleted

## 2014-03-25 NOTE — Telephone Encounter (Signed)
Patient called LCSW for address to financial counseling.  LCSW provided such. Patient will follow as needed.  [name] MSW, LCSW

## 2014-04-13 ENCOUNTER — Telehealth: Payer: Self-pay | Admitting: Internal Medicine

## 2014-04-13 ENCOUNTER — Other Ambulatory Visit: Payer: Self-pay | Admitting: *Deleted

## 2014-04-13 DIAGNOSIS — I10 Essential (primary) hypertension: Secondary | ICD-10-CM

## 2014-04-13 MED ORDER — LOSARTAN POTASSIUM 100 MG PO TABS
100.0000 mg | ORAL_TABLET | Freq: Every day | ORAL | Status: DC
Start: 2014-04-13 — End: 2014-05-17

## 2014-04-13 MED ORDER — TRAMADOL HCL 50 MG PO TABS: 50.0000 mg | ORAL_TABLET | Freq: Four times a day (QID) | ORAL | Status: DC | PRN

## 2014-04-13 MED ORDER — OMEPRAZOLE 40 MG PO CPDR: 40.0000 mg | DELAYED_RELEASE_CAPSULE | Freq: Every day | ORAL | Status: DC

## 2014-04-13 NOTE — Telephone Encounter (Signed)
Pt. Called to request multiple medication refills. Please f/u with pt.

## 2014-04-13 NOTE — Progress Notes (Signed)
Pt called needing refills. I gave her a months of her rx and made her get another appointment.

## 2014-04-20 ENCOUNTER — Other Ambulatory Visit: Payer: PRIVATE HEALTH INSURANCE

## 2014-04-27 ENCOUNTER — Ambulatory Visit: Payer: PRIVATE HEALTH INSURANCE | Attending: Internal Medicine | Admitting: Internal Medicine

## 2014-04-27 ENCOUNTER — Encounter: Payer: Self-pay | Admitting: Internal Medicine

## 2014-04-27 VITALS — BP 157/83 | HR 80 | Temp 97.9°F | Resp 16 | Wt 163.0 lb

## 2014-04-27 DIAGNOSIS — D571 Sickle-cell disease without crisis: Secondary | ICD-10-CM | POA: Insufficient documentation

## 2014-04-27 DIAGNOSIS — M545 Low back pain: Secondary | ICD-10-CM | POA: Insufficient documentation

## 2014-04-27 DIAGNOSIS — K088 Other specified disorders of teeth and supporting structures: Secondary | ICD-10-CM

## 2014-04-27 DIAGNOSIS — Z23 Encounter for immunization: Secondary | ICD-10-CM

## 2014-04-27 DIAGNOSIS — M25551 Pain in right hip: Secondary | ICD-10-CM

## 2014-04-27 DIAGNOSIS — K0889 Other specified disorders of teeth and supporting structures: Secondary | ICD-10-CM

## 2014-04-27 DIAGNOSIS — K219 Gastro-esophageal reflux disease without esophagitis: Secondary | ICD-10-CM

## 2014-04-27 DIAGNOSIS — G8929 Other chronic pain: Secondary | ICD-10-CM | POA: Insufficient documentation

## 2014-04-27 DIAGNOSIS — K029 Dental caries, unspecified: Secondary | ICD-10-CM

## 2014-04-27 DIAGNOSIS — I10 Essential (primary) hypertension: Secondary | ICD-10-CM

## 2014-04-27 MED ORDER — AMOXICILLIN 500 MG PO CAPS: 500.0000 mg | ORAL_CAPSULE | Freq: Three times a day (TID) | ORAL | Status: DC

## 2014-04-27 NOTE — Progress Notes (Signed)
Patient here for follow up on her HTN, gerd and back pain Today complains of pain to her right hip and mouth sores

## 2014-04-27 NOTE — Telephone Encounter (Signed)
Refilled by Noreene LarssonJill

## 2014-04-27 NOTE — Progress Notes (Signed)
MRN: [id number] Name: Kayla Woodard  Sex: female Age: 58 y.o. DOB: [birth year]  Allergies: Oxycodone and Vicodin  Chief Complaint  Patient presents with  . Follow-up    HPI: Patient is 58 y.o. female who has history of hypertension sickle cell disease chronic low back pain comes today for followup and also complaining of dental pain, she has dental cavities and is requesting referral to see a dentist, denies any fever chills, she also reported to have right hip pain on and off, today her blood pressure is elevated, she denies any headache dizziness chest and shortness of breath.  Past Medical History  Diagnosis Date  . Anemia   . Back pain, chronic   . Hypertension   . Sickle cell anemia     History reviewed. No pertinent past surgical history.    Medication List       This list is accurate as of: 04/27/14 12:52 PM.  Always use your most recent med list.               amoxicillin 500 MG capsule  Commonly known as:  AMOXIL  Take 1 capsule (500 mg total) by mouth 3 (three) times daily.     gabapentin 100 MG capsule  Commonly known as:  NEURONTIN  Take 1 capsule (100 mg total) by mouth 3 (three) times daily.     HYDROmorphone 2 MG tablet  Commonly known as:  DILAUDID  Take 1 tablet (2 mg total) by mouth every 4 (four) hours as needed for severe pain.     losartan 100 MG tablet  Commonly known as:  COZAAR  Take 1 tablet (100 mg total) by mouth daily.     meclizine 50 MG tablet  Commonly known as:  ANTIVERT  Take 50 mg by mouth 3 (three) times daily as needed for dizziness or nausea.     omeprazole 40 MG capsule  Commonly known as:  PRILOSEC  Take 1 capsule (40 mg total) by mouth daily.     ondansetron 4 MG tablet  Commonly known as:  ZOFRAN  Take 1 tablet (4 mg total) by mouth every 8 (eight) hours as needed for nausea.     traMADol 50 MG tablet  Commonly known as:  ULTRAM  Take 1 tablet (50 mg total) by mouth every 6 (six) hours as needed for  moderate pain.        Meds ordered this encounter  Medications  . amoxicillin (AMOXIL) 500 MG capsule    Sig: Take 1 capsule (500 mg total) by mouth 3 (three) times daily.    Dispense:  30 capsule    Refill:  0    Immunization History  Administered Date(s) Administered  . Influenza Whole 04/01/2009, 06/05/2010  . Influenza,inj,Quad PF,36+ Mos 04/27/2014  . Td 07/07/2007    History reviewed. No pertinent family history.  History  Substance Use Topics  . Smoking status: Never Smoker   . Smokeless tobacco: Not on file  . Alcohol Use: No    Review of Systems   As noted in HPI  Filed Vitals:   04/27/14 1038  BP: 157/83  Pulse: 80  Temp: 97.9 F (36.6 C)  Resp: 16    Physical Exam  Physical Exam  HENT:  Dental cavities   Eyes: EOM are normal. Pupils are equal, round, and reactive to light.  Cardiovascular: Normal rate and regular rhythm.   Pulmonary/Chest: Breath sounds normal. No respiratory distress. She has no wheezes. She has no rales.  Musculoskeletal:  Minimal lower lumbar paraspinal tenderness SLR negative     CBC    Component Value Date/Time   WBC 4.2 02/18/2014 1250   WBC 4.9 11/21/2010 1336   RBC 4.06 02/18/2014 1250   RBC 3.87 11/26/2013 1047   RBC 3.82 11/21/2010 1336   HGB 10.4* 02/18/2014 1250   HGB 10.2* 11/21/2010 1336   HCT 28.7* 02/18/2014 1250   HCT 29.2* 11/21/2010 1336   PLT 169 02/18/2014 1250   PLT 149 11/21/2010 1336   MCV 70.7* 02/18/2014 1250   MCV 76.3* 11/21/2010 1336   LYMPHSABS 1.6 02/18/2014 1250   LYMPHSABS 1.6 11/21/2010 1336   MONOABS 0.2 02/18/2014 1250   MONOABS 0.3 11/21/2010 1336   EOSABS 0.1 02/18/2014 1250   EOSABS 0.1 11/21/2010 1336   BASOSABS 0.0 02/18/2014 1250   BASOSABS 0.0 11/21/2010 1336    CMP     Component Value Date/Time   NA 142 02/18/2014 1250   K 3.8 02/18/2014 1250   CL 105 02/18/2014 1250   CO2 25 02/18/2014 1250   GLUCOSE 91 02/18/2014 1250   BUN 9 02/18/2014 1250   CREATININE 0.76 02/18/2014 1250    CREATININE 0.71 01/05/2014 0912   CALCIUM 9.2 02/18/2014 1250   PROT 7.8 02/18/2014 1250   ALBUMIN 4.1 02/18/2014 1250   AST 15 02/18/2014 1250   ALT 19 02/18/2014 1250   ALKPHOS 94 02/18/2014 1250   BILITOT 1.1 02/18/2014 1250   GFRNONAA >90 02/18/2014 1250   GFRNONAA >89 01/05/2014 0912   GFRAA >90 02/18/2014 1250   GFRAA >89 01/05/2014 0912    Lab Results  Component Value Date/Time   CHOL 162 01/05/2014  9:12 AM    No components found with this basename: hga1c    Lab Results  Component Value Date/Time   AST 15 02/18/2014 12:50 PM    Assessment and Plan  Essential hypertension Blood pressure is borderline elevated, advise for DASH diet continue with current meds, will repeat blood chemistry on the next visit  Gastroesophageal reflux disease, esophagitis presence not specified  Right hip pain Patient already has pain medication, offered getting  x-ray on her right hip, patient declines at this time.  Pain, dental - Plan: Ambulatory referral to Dentistry, amoxicillin (AMOXIL) 500 MG capsule  Dental cavities - Plan: Ambulatory referral to Dentistry   Health Maintenance Flu shot given today   Return in about 3 months (around 07/28/2014) for hypertension.  Doris CheadleADVANI, Jurnee Nakayama, MD

## 2014-04-27 NOTE — Patient Instructions (Signed)
DASH Eating Plan °DASH stands for "Dietary Approaches to Stop Hypertension." The DASH eating plan is a healthy eating plan that has been shown to reduce high blood pressure (hypertension). Additional health benefits may include reducing the risk of type 2 diabetes mellitus, heart disease, and stroke. The DASH eating plan may also help with weight loss. °WHAT DO I NEED TO KNOW ABOUT THE DASH EATING PLAN? °For the DASH eating plan, you will follow these general guidelines: °· Choose foods with a percent daily value for sodium of less than 5% (as listed on the food label). °· Use salt-free seasonings or herbs instead of table salt or sea salt. °· Check with your health care provider or pharmacist before using salt substitutes. °· Eat lower-sodium products, often labeled as "lower sodium" or "no salt added." °· Eat fresh foods. °· Eat more vegetables, fruits, and low-fat dairy products. °· Choose whole grains. Look for the word "whole" as the first word in the ingredient list. °· Choose fish and skinless chicken or turkey more often than red meat. Limit fish, poultry, and meat to 6 oz (170 g) each day. °· Limit sweets, desserts, sugars, and sugary drinks. °· Choose heart-healthy fats. °· Limit cheese to 1 oz (28 g) per day. °· Eat more home-cooked food and less restaurant, buffet, and fast food. °· Limit fried foods. °· Cook foods using methods other than frying. °· Limit canned vegetables. If you do use them, rinse them well to decrease the sodium. °· When eating at a restaurant, ask that your food be prepared with less salt, or no salt if possible. °WHAT FOODS CAN I EAT? °Seek help from a dietitian for individual calorie needs. °Grains °Whole grain or whole wheat bread. Brown rice. Whole grain or whole wheat pasta. Quinoa, bulgur, and whole grain cereals. Low-sodium cereals. Corn or whole wheat flour tortillas. Whole grain cornbread. Whole grain crackers. Low-sodium crackers. °Vegetables °Fresh or frozen vegetables  (raw, steamed, roasted, or grilled). Low-sodium or reduced-sodium tomato and vegetable juices. Low-sodium or reduced-sodium tomato sauce and paste. Low-sodium or reduced-sodium canned vegetables.  °Fruits °All fresh, canned (in natural juice), or frozen fruits. °Meat and Other Protein Products °Ground beef (85% or leaner), grass-fed beef, or beef trimmed of fat. Skinless chicken or turkey. Ground chicken or turkey. Pork trimmed of fat. All fish and seafood. Eggs. Dried beans, peas, or lentils. Unsalted nuts and seeds. Unsalted canned beans. °Dairy °Low-fat dairy products, such as skim or 1% milk, 2% or reduced-fat cheeses, low-fat ricotta or cottage cheese, or plain low-fat yogurt. Low-sodium or reduced-sodium cheeses. °Fats and Oils °Tub margarines without trans fats. Light or reduced-fat mayonnaise and salad dressings (reduced sodium). Avocado. Safflower, olive, or canola oils. Natural peanut or almond butter. °Other °Unsalted popcorn and pretzels. °The items listed above may not be a complete list of recommended foods or beverages. Contact your dietitian for more options. °WHAT FOODS ARE NOT RECOMMENDED? °Grains °White bread. White pasta. White rice. Refined cornbread. Bagels and croissants. Crackers that contain trans fat. °Vegetables °Creamed or fried vegetables. Vegetables in a cheese sauce. Regular canned vegetables. Regular canned tomato sauce and paste. Regular tomato and vegetable juices. °Fruits °Dried fruits. Canned fruit in light or heavy syrup. Fruit juice. °Meat and Other Protein Products °Fatty cuts of meat. Ribs, chicken wings, bacon, sausage, bologna, salami, chitterlings, fatback, hot dogs, bratwurst, and packaged luncheon meats. Salted nuts and seeds. Canned beans with salt. °Dairy °Whole or 2% milk, cream, half-and-half, and cream cheese. Whole-fat or sweetened yogurt. Full-fat   cheeses or blue cheese. Nondairy creamers and whipped toppings. Processed cheese, cheese spreads, or cheese  curds. °Condiments °Onion and garlic salt, seasoned salt, table salt, and sea salt. Canned and packaged gravies. Worcestershire sauce. Tartar sauce. Barbecue sauce. Teriyaki sauce. Soy sauce, including reduced sodium. Steak sauce. Fish sauce. Oyster sauce. Cocktail sauce. Horseradish. Ketchup and mustard. Meat flavorings and tenderizers. Bouillon cubes. Hot sauce. Tabasco sauce. Marinades. Taco seasonings. Relishes. °Fats and Oils °Butter, stick margarine, lard, shortening, ghee, and bacon fat. Coconut, palm kernel, or palm oils. Regular salad dressings. °Other °Pickles and olives. Salted popcorn and pretzels. °The items listed above may not be a complete list of foods and beverages to avoid. Contact your dietitian for more information. °WHERE CAN I FIND MORE INFORMATION? °National [employer], [employer], and [employer] Institute: www.nhlbi.nih.gov/health/health-topics/topics/dash/ °Document Released: 06/07/2011 Document Revised: 11/02/2013 Document Reviewed: 04/22/2013 °Exit[employer]® Patient Information ©2015 [employer]. This information is not intended to replace advice given to you by your health care provider. Make sure you discuss any questions you have with your health care provider. ° °

## 2014-05-07 ENCOUNTER — Telehealth: Payer: Self-pay | Admitting: Internal Medicine

## 2014-05-07 NOTE — Telephone Encounter (Signed)
Patient has called in to request a medication refill for traMADol (ULTRAM) 50 MG tablet; please f/u with patient about this request °

## 2014-05-10 ENCOUNTER — Telehealth: Payer: Self-pay | Admitting: Internal Medicine

## 2014-05-10 NOTE — Telephone Encounter (Signed)
Patient has called in to request a medication refill for traMADol (ULTRAM) 50 MG tablet; please f/u with patient about this request

## 2014-05-11 NOTE — Telephone Encounter (Signed)
Expand All Collapse All   Patient has called in to request a medication refill for traMADol (ULTRAM) 50 MG tablet; please f/u with patient about this request

## 2014-05-11 NOTE — Telephone Encounter (Signed)
Patient can be given refill on tramadol 50 mg tablet every 12 hourly when necessary for pain # 60 pills.

## 2014-05-12 ENCOUNTER — Telehealth: Payer: Self-pay | Admitting: Internal Medicine

## 2014-05-12 NOTE — Telephone Encounter (Signed)
Patient calling to check on medication refill request. Patient informed that her Lorin Glassramdol has been filled. (per note in epic) Patient is now requesting medication refill for all of her medications. Please assist

## 2014-05-13 NOTE — Telephone Encounter (Signed)
Pt calling to follow up on her Tramadol refill, please f/u with pt.

## 2014-05-14 ENCOUNTER — Other Ambulatory Visit: Payer: Self-pay | Admitting: Emergency Medicine

## 2014-05-14 ENCOUNTER — Telehealth: Payer: Self-pay | Admitting: Emergency Medicine

## 2014-05-14 MED ORDER — TRAMADOL HCL 50 MG PO TABS: 50.0000 mg | ORAL_TABLET | Freq: Two times a day (BID) | ORAL | Status: DC | PRN

## 2014-05-14 MED ORDER — OMEPRAZOLE 40 MG PO CPDR: 40.0000 mg | DELAYED_RELEASE_CAPSULE | Freq: Every day | ORAL | Status: DC

## 2014-05-14 NOTE — Telephone Encounter (Signed)
Patient called stating that she needs tramadol refill and Losartan.  Beverly Sessionsywan J Glennice Marcos MSW, LCSW

## 2014-05-17 ENCOUNTER — Other Ambulatory Visit: Payer: Self-pay | Admitting: *Deleted

## 2014-05-17 DIAGNOSIS — I10 Essential (primary) hypertension: Secondary | ICD-10-CM

## 2014-05-17 MED ORDER — LOSARTAN POTASSIUM 100 MG PO TABS: 100.0000 mg | ORAL_TABLET | Freq: Every day | ORAL | Status: DC

## 2014-06-03 ENCOUNTER — Telehealth: Payer: Self-pay | Admitting: Internal Medicine

## 2014-06-03 NOTE — Telephone Encounter (Signed)
Pt calling to speak to clinician. Please f/u with pt.

## 2014-06-11 ENCOUNTER — Other Ambulatory Visit: Payer: Self-pay | Admitting: Internal Medicine

## 2014-06-14 ENCOUNTER — Other Ambulatory Visit: Payer: Self-pay | Admitting: Internal Medicine

## 2014-06-14 ENCOUNTER — Telehealth: Payer: Self-pay | Admitting: Internal Medicine

## 2014-06-14 ENCOUNTER — Other Ambulatory Visit: Payer: Self-pay

## 2014-06-14 MED ORDER — TRAMADOL HCL 50 MG PO TABS: 50.0000 mg | ORAL_TABLET | Freq: Two times a day (BID) | ORAL | Status: DC | PRN

## 2014-06-14 NOTE — Progress Notes (Unsigned)
Patient here in office requesting refill on her tramadol Patient has been explained several times to call to request medication refills Because it disrupts the clinic during patient visits. Patient has acknowledged this in the past and  Persists on showing up in the office

## 2014-06-14 NOTE — Telephone Encounter (Signed)
Patient presented in office to pick up script for Tramadol. Patient then informs us that she also needs the following medication refilled: losartan (COZAAR) 100 MG tablet. Informed patient to allow clinic staff 24-48 hours to complete her refill request. Patient verbalized understanding. Please assist.

## 2014-06-23 ENCOUNTER — Other Ambulatory Visit: Payer: Self-pay | Admitting: Emergency Medicine

## 2014-06-23 DIAGNOSIS — I10 Essential (primary) hypertension: Secondary | ICD-10-CM

## 2014-06-23 MED ORDER — LOSARTAN POTASSIUM 100 MG PO TABS: 100.0000 mg | ORAL_TABLET | Freq: Every day | ORAL | Status: DC

## 2014-07-06 ENCOUNTER — Ambulatory Visit: Payer: PRIVATE HEALTH INSURANCE | Attending: Internal Medicine | Admitting: Internal Medicine

## 2014-07-06 ENCOUNTER — Encounter: Payer: Self-pay | Admitting: Internal Medicine

## 2014-07-06 VITALS — BP 129/83 | HR 84 | Temp 98.0°F | Resp 16 | Wt 166.8 lb

## 2014-07-06 DIAGNOSIS — Z862 Personal history of diseases of the blood and blood-forming organs and certain disorders involving the immune mechanism: Secondary | ICD-10-CM

## 2014-07-06 DIAGNOSIS — Z792 Long term (current) use of antibiotics: Secondary | ICD-10-CM | POA: Insufficient documentation

## 2014-07-06 DIAGNOSIS — I10 Essential (primary) hypertension: Secondary | ICD-10-CM | POA: Insufficient documentation

## 2014-07-06 DIAGNOSIS — R42 Dizziness and giddiness: Secondary | ICD-10-CM | POA: Insufficient documentation

## 2014-07-06 DIAGNOSIS — M545 Low back pain: Secondary | ICD-10-CM | POA: Insufficient documentation

## 2014-07-06 DIAGNOSIS — Z79899 Other long term (current) drug therapy: Secondary | ICD-10-CM | POA: Insufficient documentation

## 2014-07-06 DIAGNOSIS — K219 Gastro-esophageal reflux disease without esophagitis: Secondary | ICD-10-CM | POA: Insufficient documentation

## 2014-07-06 LAB — CBC WITH DIFFERENTIAL/PLATELET
BASOS ABS: 0 10*3/uL (ref 0.0–0.1)
BASOS PCT: 0 % (ref 0–1)
EOS PCT: 1 % (ref 0–5)
Eosinophils Absolute: 0 10*3/uL (ref 0.0–0.7)
HCT: 32.4 % — ABNORMAL LOW (ref 36.0–46.0)
Hemoglobin: 10.9 g/dL — ABNORMAL LOW (ref 12.0–15.0)
Lymphocytes Relative: 34 % (ref 12–46)
Lymphs Abs: 1.5 10*3/uL (ref 0.7–4.0)
MCH: 25.5 pg — AB (ref 26.0–34.0)
MCHC: 33.6 g/dL (ref 30.0–36.0)
MCV: 75.9 fL — ABNORMAL LOW (ref 78.0–100.0)
MONO ABS: 0.3 10*3/uL (ref 0.1–1.0)
MPV: 10.2 fL (ref 8.6–12.4)
Monocytes Relative: 6 % (ref 3–12)
Neutro Abs: 2.5 10*3/uL (ref 1.7–7.7)
Neutrophils Relative %: 59 % (ref 43–77)
PLATELETS: 170 10*3/uL (ref 150–400)
RBC: 4.27 MIL/uL (ref 3.87–5.11)
RDW: 18.5 % — ABNORMAL HIGH (ref 11.5–15.5)
WBC: 4.3 10*3/uL (ref 4.0–10.5)

## 2014-07-06 LAB — COMPLETE METABOLIC PANEL WITH GFR
ALT: 11 U/L (ref 0–35)
AST: 16 U/L (ref 0–37)
Albumin: 4.3 g/dL (ref 3.5–5.2)
Alkaline Phosphatase: 83 U/L (ref 39–117)
BILIRUBIN TOTAL: 1.2 mg/dL (ref 0.2–1.2)
BUN: 9 mg/dL (ref 6–23)
CO2: 28 mEq/L (ref 19–32)
CREATININE: 0.76 mg/dL (ref 0.50–1.10)
Calcium: 9 mg/dL (ref 8.4–10.5)
Chloride: 105 mEq/L (ref 96–112)
GFR, Est Non African American: 88 mL/min
Glucose, Bld: 74 mg/dL (ref 70–99)
Potassium: 3.8 mEq/L (ref 3.5–5.3)
Sodium: 140 mEq/L (ref 135–145)
Total Protein: 7.3 g/dL (ref 6.0–8.3)

## 2014-07-06 LAB — ANEMIA PANEL
%SAT: 26 % (ref 20–55)
ABS Retic: 98.2 10*3/uL (ref 19.0–186.0)
FOLATE: 11 ng/mL
Ferritin: 83 ng/mL (ref 10–291)
Iron: 84 ug/dL (ref 42–145)
RBC.: 4.27 MIL/uL (ref 3.87–5.11)
Retic Ct Pct: 2.3 % (ref 0.4–2.3)
TIBC: 321 ug/dL (ref 250–470)
UIBC: 237 ug/dL (ref 125–400)
Vitamin B-12: 801 pg/mL (ref 211–911)

## 2014-07-06 MED ORDER — OMEPRAZOLE 40 MG PO CPDR: 40.0000 mg | DELAYED_RELEASE_CAPSULE | Freq: Every day | ORAL | Status: DC

## 2014-07-06 NOTE — Progress Notes (Signed)
Patient complains of feeling fatigued and having some dizziness States this has been going on for almost a month Patient is requesting to have bloodwork done

## 2014-07-06 NOTE — Progress Notes (Signed)
MRN: [id number] Name: Kayla Woodard  Sex: female Age: 59 y.o. DOB: 04-28-1956  Allergies: Oxycodone and Vicodin  Chief Complaint  Patient presents with  . Dizziness    HPI: Patient is 59 y.o. female who has history of hypertension, history of sickle cell anemia, chronic low back pain comes today reported to have feeling dizziness denies any chest pain shortness of breath denies any ear pain discharge, denies any room spinning sensation, as per patient it is probably anemia which sometimes give her the symptoms as per patient is not taking any iron supplements, as per patient she was diagnosed with [place] disease in the past. Patient currently denies any dizziness fever chills.  Past Medical History  Diagnosis Date  . Anemia   . Back pain, chronic   . Hypertension   . Sickle cell anemia     History reviewed. No pertinent past surgical history.    Medication List       This list is accurate as of: 07/06/14 10:17 AM.  Always use your most recent med list.               amoxicillin 500 MG capsule  Commonly known as:  AMOXIL  Take 1 capsule (500 mg total) by mouth 3 (three) times daily.     gabapentin 100 MG capsule  Commonly known as:  NEURONTIN  Take 1 capsule (100 mg total) by mouth 3 (three) times daily.     HYDROmorphone 2 MG tablet  Commonly known as:  DILAUDID  Take 1 tablet (2 mg total) by mouth every 4 (four) hours as needed for severe pain.     losartan 100 MG tablet  Commonly known as:  COZAAR  Take 1 tablet (100 mg total) by mouth daily.     meclizine 50 MG tablet  Commonly known as:  ANTIVERT  Take 50 mg by mouth 3 (three) times daily as needed for dizziness or nausea.     omeprazole 40 MG capsule  Commonly known as:  PRILOSEC  Take 1 capsule (40 mg total) by mouth daily.     ondansetron 4 MG tablet  Commonly known as:  ZOFRAN  Take 1 tablet (4 mg total) by mouth every 8 (eight) hours as needed for nausea.     traMADol 50 MG tablet  Commonly  known as:  ULTRAM  Take 1 tablet (50 mg total) by mouth every 12 (twelve) hours as needed for moderate pain.        Meds ordered this encounter  Medications  . omeprazole (PRILOSEC) 40 MG capsule    Sig: Take 1 capsule (40 mg total) by mouth daily.    Dispense:  30 capsule    Refill:  2    Immunization History  Administered Date(s) Administered  . Influenza Whole 04/01/2009, 06/05/2010  . Influenza,inj,Quad PF,36+ Mos 04/27/2014  . Td 07/07/2007    History reviewed. No pertinent family history.  History  Substance Use Topics  . Smoking status: Never Smoker   . Smokeless tobacco: Not on file  . Alcohol Use: No    Review of Systems   As noted in HPI  Filed Vitals:   07/06/14 0936  BP: 129/83  Pulse: 84  Temp: 98 F (36.7 C)  Resp: 16    Physical Exam  Physical Exam  HENT:  Both TMs visualized not congested   Eyes: EOM are normal. Pupils are equal, round, and reactive to light.  No nystagmus   Cardiovascular: Normal rate and regular  rhythm.   Pulmonary/Chest: Breath sounds normal. No respiratory distress. She has no wheezes. She has no rales.    CBC    Component Value Date/Time   WBC 4.2 02/18/2014 1250   WBC 4.9 11/21/2010 1336   RBC 4.06 02/18/2014 1250   RBC 3.87 11/26/2013 1047   RBC 3.82 11/21/2010 1336   HGB 10.4* 02/18/2014 1250   HGB 10.2* 11/21/2010 1336   HCT 28.7* 02/18/2014 1250   HCT 29.2* 11/21/2010 1336   PLT 169 02/18/2014 1250   PLT 149 11/21/2010 1336   MCV 70.7* 02/18/2014 1250   MCV 76.3* 11/21/2010 1336   LYMPHSABS 1.6 02/18/2014 1250   LYMPHSABS 1.6 11/21/2010 1336   MONOABS 0.2 02/18/2014 1250   MONOABS 0.3 11/21/2010 1336   EOSABS 0.1 02/18/2014 1250   EOSABS 0.1 11/21/2010 1336   BASOSABS 0.0 02/18/2014 1250   BASOSABS 0.0 11/21/2010 1336    CMP     Component Value Date/Time   NA 142 02/18/2014 1250   K 3.8 02/18/2014 1250   CL 105 02/18/2014 1250   CO2 25 02/18/2014 1250   GLUCOSE 91 02/18/2014 1250   BUN  9 02/18/2014 1250   CREATININE 0.76 02/18/2014 1250   CREATININE 0.71 01/05/2014 0912   CALCIUM 9.2 02/18/2014 1250   PROT 7.8 02/18/2014 1250   ALBUMIN 4.1 02/18/2014 1250   AST 15 02/18/2014 1250   ALT 19 02/18/2014 1250   ALKPHOS 94 02/18/2014 1250   BILITOT 1.1 02/18/2014 1250   GFRNONAA >90 02/18/2014 1250   GFRNONAA >89 01/05/2014 0912   GFRAA >90 02/18/2014 1250   GFRAA >89 01/05/2014 0912    Lab Results  Component Value Date/Time   CHOL 162 01/05/2014 09:12 AM    No components found for: HGA1C  Lab Results  Component Value Date/Time   AST 15 02/18/2014 12:50 PM    Assessment and Plan  Essential hypertension - Plan: blood pressure is well controlled, continue with current medication, repeat blood chemistry COMPLETE METABOLIC PANEL WITH GFR  History of anemia - Plan: Anemia panel, CBC with Differential  Gastroesophageal reflux disease, esophagitis presence not specified - Plan: advised patient for low modification, continue with  omeprazole (PRILOSEC) 40 MG capsule  Dizziness and giddiness - Plan: likely secondary to anemia will check Anemia panel, COMPLETE METABOLIC PANEL WITH GFR   Health Maintenance -Colonoscopy: disscued patient will think about it   -Mammogram: uptodate  -Vaccinations: uptodate with flu shot   Return in about 3 months (around 10/05/2014) for anemia.  Doris Cheadle, MD

## 2014-07-13 ENCOUNTER — Other Ambulatory Visit: Payer: Self-pay

## 2014-07-13 MED ORDER — TRAMADOL HCL 50 MG PO TABS: 50.0000 mg | ORAL_TABLET | Freq: Two times a day (BID) | ORAL | Status: DC | PRN

## 2014-07-13 NOTE — Progress Notes (Unsigned)
Patient stopped in office today requesting a refill on her tramadol Prescription printed and given to the patient

## 2014-08-09 ENCOUNTER — Telehealth: Payer: Self-pay | Admitting: Internal Medicine

## 2014-08-09 NOTE — Telephone Encounter (Signed)
Patient called to request a med refill for Tramadol, please f/u with pt. °

## 2014-08-11 ENCOUNTER — Telehealth: Payer: Self-pay | Admitting: Internal Medicine

## 2014-08-11 NOTE — Telephone Encounter (Signed)
Patient has come in today to request a medication refill for traMADol (ULTRAM) 50 MG tablet; please f/u with patient about her request

## 2014-08-11 NOTE — Telephone Encounter (Signed)
Patient can be given refill on the medication. 

## 2014-08-12 MED ORDER — TRAMADOL HCL 50 MG PO TABS: 50.0000 mg | ORAL_TABLET | Freq: Two times a day (BID) | ORAL | Status: DC | PRN

## 2014-08-12 NOTE — Addendum Note (Signed)
Addended by: Dyann KiefGIRALDEZ, Kit Brubacher M on: 08/12/2014 02:57 PM   Modules accepted: Orders

## 2014-08-12 NOTE — Telephone Encounter (Signed)
Pt aware, Rx at front office ready to be pick up

## 2014-09-13 ENCOUNTER — Telehealth: Payer: Self-pay | Admitting: General Practice

## 2014-09-13 NOTE — Telephone Encounter (Signed)
Patient presents to clinic to request medication refill for Tramadol. Patient is in the lobby waiting. Informed patient that nurse will call and has up to 48 hours to respond to these request.

## 2014-09-15 ENCOUNTER — Telehealth: Payer: Self-pay | Admitting: Internal Medicine

## 2014-09-15 NOTE — Telephone Encounter (Signed)
Patient called to request a med refill for Tramadol, patient does not have any more medication.

## 2014-09-21 ENCOUNTER — Telehealth: Payer: Self-pay | Admitting: Internal Medicine

## 2014-09-21 NOTE — Telephone Encounter (Signed)
Patient is calling to request a med refill for Tramadol, please f/u with pt. °

## 2014-09-23 ENCOUNTER — Telehealth: Payer: Self-pay

## 2014-09-23 ENCOUNTER — Telehealth: Payer: Self-pay | Admitting: Internal Medicine

## 2014-09-23 ENCOUNTER — Other Ambulatory Visit: Payer: Self-pay

## 2014-09-23 MED ORDER — TRAMADOL HCL 50 MG PO TABS: 50.0000 mg | ORAL_TABLET | Freq: Three times a day (TID) | ORAL | Status: DC | PRN

## 2014-09-23 NOTE — Telephone Encounter (Signed)
Patient called to request a med refill for Tramadol, please f/u with pt. °

## 2014-09-23 NOTE — Telephone Encounter (Signed)
Patient called for refills on her tramadol Prescription changed to every 8 hours prn Prescription is at the front desk

## 2014-10-12 ENCOUNTER — Telehealth: Payer: Self-pay | Admitting: Internal Medicine

## 2014-10-12 NOTE — Telephone Encounter (Signed)
Patient called to request a med refill for omeprazole (PRILOSEC) 40 MG capsule and losartan (COZAAR) 100 MG tablet. Please f/u with patient.

## 2014-10-13 ENCOUNTER — Telehealth: Payer: Self-pay

## 2014-10-13 DIAGNOSIS — I10 Essential (primary) hypertension: Secondary | ICD-10-CM

## 2014-10-13 DIAGNOSIS — K219 Gastro-esophageal reflux disease without esophagitis: Secondary | ICD-10-CM

## 2014-10-13 MED ORDER — LOSARTAN POTASSIUM 100 MG PO TABS: 100.0000 mg | ORAL_TABLET | Freq: Every day | ORAL | Status: DC

## 2014-10-13 MED ORDER — OMEPRAZOLE 40 MG PO CPDR: 40.0000 mg | DELAYED_RELEASE_CAPSULE | Freq: Every day | ORAL | Status: DC

## 2014-10-13 NOTE — Telephone Encounter (Signed)
Pt calling to follow up on med refill request, please send to [facility] pharmacy.

## 2014-10-13 NOTE — Telephone Encounter (Signed)
Patient called requesting refill on her cozar and omeprazole [facility]

## 2014-10-26 ENCOUNTER — Ambulatory Visit: Payer: Self-pay | Attending: Internal Medicine | Admitting: Internal Medicine

## 2014-10-26 ENCOUNTER — Encounter: Payer: Self-pay | Admitting: Internal Medicine

## 2014-10-26 VITALS — BP 130/80 | HR 75 | Temp 98.0°F | Resp 16 | Wt 166.8 lb

## 2014-10-26 DIAGNOSIS — Z862 Personal history of diseases of the blood and blood-forming organs and certain disorders involving the immune mechanism: Secondary | ICD-10-CM

## 2014-10-26 DIAGNOSIS — I10 Essential (primary) hypertension: Secondary | ICD-10-CM

## 2014-10-26 DIAGNOSIS — G894 Chronic pain syndrome: Secondary | ICD-10-CM

## 2014-10-26 LAB — CBC WITH DIFFERENTIAL/PLATELET
BASOS ABS: 0 10*3/uL (ref 0.0–0.1)
Basophils Relative: 1 % (ref 0–1)
EOS PCT: 2 % (ref 0–5)
Eosinophils Absolute: 0.1 10*3/uL (ref 0.0–0.7)
HCT: 32.1 % — ABNORMAL LOW (ref 36.0–46.0)
Hemoglobin: 10.8 g/dL — ABNORMAL LOW (ref 12.0–15.0)
LYMPHS PCT: 34 % (ref 12–46)
Lymphs Abs: 1.2 10*3/uL (ref 0.7–4.0)
MCH: 25.1 pg — AB (ref 26.0–34.0)
MCHC: 33.6 g/dL (ref 30.0–36.0)
MCV: 74.5 fL — ABNORMAL LOW (ref 78.0–100.0)
MONO ABS: 0.2 10*3/uL (ref 0.1–1.0)
MPV: 9.8 fL (ref 8.6–12.4)
Monocytes Relative: 5 % (ref 3–12)
Neutro Abs: 2.1 10*3/uL (ref 1.7–7.7)
Neutrophils Relative %: 58 % (ref 43–77)
Platelets: 160 10*3/uL (ref 150–400)
RBC: 4.31 MIL/uL (ref 3.87–5.11)
RDW: 18.2 % — ABNORMAL HIGH (ref 11.5–15.5)
WBC: 3.6 10*3/uL — AB (ref 4.0–10.5)

## 2014-10-26 MED ORDER — TRAMADOL HCL 50 MG PO TABS: 50.0000 mg | ORAL_TABLET | Freq: Three times a day (TID) | ORAL | Status: DC | PRN

## 2014-10-26 NOTE — Progress Notes (Signed)
Patient here for follow up Complains of feeling weak lately and requesting some blood work Patient also requesting a refill on her tramadol

## 2014-10-26 NOTE — Patient Instructions (Signed)
DASH Eating Plan °DASH stands for "Dietary Approaches to Stop Hypertension." The DASH eating plan is a healthy eating plan that has been shown to reduce high blood pressure (hypertension). Additional health benefits may include reducing the risk of type 2 diabetes mellitus, heart disease, and stroke. The DASH eating plan may also help with weight loss. °WHAT DO I NEED TO KNOW ABOUT THE DASH EATING PLAN? °For the DASH eating plan, you will follow these general guidelines: °· Choose foods with a percent daily value for sodium of less than 5% (as listed on the food label). °· Use salt-free seasonings or herbs instead of table salt or sea salt. °· Check with your health care provider or pharmacist before using salt substitutes. °· Eat lower-sodium products, often labeled as "lower sodium" or "no salt added." °· Eat fresh foods. °· Eat more vegetables, fruits, and low-fat dairy products. °· Choose whole grains. Look for the word "whole" as the first word in the ingredient list. °· Choose fish and skinless chicken or turkey more often than red meat. Limit fish, poultry, and meat to 6 oz (170 g) each day. °· Limit sweets, desserts, sugars, and sugary drinks. °· Choose heart-healthy fats. °· Limit cheese to 1 oz (28 g) per day. °· Eat more home-cooked food and less restaurant, buffet, and fast food. °· Limit fried foods. °· Cook foods using methods other than frying. °· Limit canned vegetables. If you do use them, rinse them well to decrease the sodium. °· When eating at a restaurant, ask that your food be prepared with less salt, or no salt if possible. °WHAT FOODS CAN I EAT? °Seek help from a dietitian for individual calorie needs. °Grains °Whole grain or whole wheat bread. Brown rice. Whole grain or whole wheat pasta. Quinoa, bulgur, and whole grain cereals. Low-sodium cereals. Corn or whole wheat flour tortillas. Whole grain cornbread. Whole grain crackers. Low-sodium crackers. °Vegetables °Fresh or frozen vegetables  (raw, steamed, roasted, or grilled). Low-sodium or reduced-sodium tomato and vegetable juices. Low-sodium or reduced-sodium tomato sauce and paste. Low-sodium or reduced-sodium canned vegetables.  °Fruits °All fresh, canned (in natural juice), or frozen fruits. °Meat and Other Protein Products °Ground beef (85% or leaner), grass-fed beef, or beef trimmed of fat. Skinless chicken or turkey. Ground chicken or turkey. Pork trimmed of fat. All fish and seafood. Eggs. Dried beans, peas, or lentils. Unsalted nuts and seeds. Unsalted canned beans. °Dairy °Low-fat dairy products, such as skim or 1% milk, 2% or reduced-fat cheeses, low-fat ricotta or cottage cheese, or plain low-fat yogurt. Low-sodium or reduced-sodium cheeses. °Fats and Oils °Tub margarines without trans fats. Light or reduced-fat mayonnaise and salad dressings (reduced sodium). Avocado. Safflower, olive, or canola oils. Natural peanut or almond butter. °Other °Unsalted popcorn and pretzels. °The items listed above may not be a complete list of recommended foods or beverages. Contact your dietitian for more options. °WHAT FOODS ARE NOT RECOMMENDED? °Grains °White bread. White pasta. White rice. Refined cornbread. Bagels and croissants. Crackers that contain trans fat. °Vegetables °Creamed or fried vegetables. Vegetables in a cheese sauce. Regular canned vegetables. Regular canned tomato sauce and paste. Regular tomato and vegetable juices. °Fruits °Dried fruits. Canned fruit in light or heavy syrup. Fruit juice. °Meat and Other Protein Products °Fatty cuts of meat. Ribs, chicken wings, bacon, sausage, bologna, salami, chitterlings, fatback, hot dogs, bratwurst, and packaged luncheon meats. Salted nuts and seeds. Canned beans with salt. °Dairy °Whole or 2% milk, cream, half-and-half, and cream cheese. Whole-fat or sweetened yogurt. Full-fat   cheeses or blue cheese. Nondairy creamers and whipped toppings. Processed cheese, cheese spreads, or cheese  curds. °Condiments °Onion and garlic salt, seasoned salt, table salt, and sea salt. Canned and packaged gravies. Worcestershire sauce. Tartar sauce. Barbecue sauce. Teriyaki sauce. Soy sauce, including reduced sodium. Steak sauce. Fish sauce. Oyster sauce. Cocktail sauce. Horseradish. Ketchup and mustard. Meat flavorings and tenderizers. Bouillon cubes. Hot sauce. Tabasco sauce. Marinades. Taco seasonings. Relishes. °Fats and Oils °Butter, stick margarine, lard, shortening, ghee, and bacon fat. Coconut, palm kernel, or palm oils. Regular salad dressings. °Other °Pickles and olives. Salted popcorn and pretzels. °The items listed above may not be a complete list of foods and beverages to avoid. Contact your dietitian for more information. °WHERE CAN I FIND MORE INFORMATION? °National [employer], [employer], and [employer] Institute: www.nhlbi.nih.gov/health/health-topics/topics/dash/ °Document Released: 06/07/2011 Document Revised: 11/02/2013 Document Reviewed: 04/22/2013 °Exit[employer]® Patient Information ©2015 [employer]. This information is not intended to replace advice given to you by your health care provider. Make sure you discuss any questions you have with your health care provider. ° °

## 2014-10-26 NOTE — Progress Notes (Signed)
MRN: [id number] Name: Kayla Woodard  Sex: female Age: 59 y.o. DOB: [birth year]  Allergies: Oxycodone and Vicodin  Chief Complaint  Patient presents with  . Follow-up    HPI: Patient is 59 y.o. female who History of hypertension, history of anemia chronic pain syndrome, complaining of feeling tired , denies any bleeding, as per patient she was diagnosed with [place] disease in the past, as per patient she takes multivitamins, B complex, previous anemia panel reviewed was normal, patient has not had any hemoglobin electrophoresis done , today her blood pressure was elevated, repeat manual blood pressure is improved her and is and 30/80 as per patient she is compliant in taking her blood pressure medication.  Past Medical History  Diagnosis Date  . Anemia   . Back pain, chronic   . Hypertension   . Sickle cell anemia     History reviewed. No pertinent past surgical history.    Medication List       This list is accurate as of: 10/26/14 11:15 AM.  Always use your most recent med list.               amoxicillin 500 MG capsule  Commonly known as:  AMOXIL  Take 1 capsule (500 mg total) by mouth 3 (three) times daily.     gabapentin 100 MG capsule  Commonly known as:  NEURONTIN  Take 1 capsule (100 mg total) by mouth 3 (three) times daily.     losartan 100 MG tablet  Commonly known as:  COZAAR  Take 1 tablet (100 mg total) by mouth daily.     meclizine 50 MG tablet  Commonly known as:  ANTIVERT  Take 50 mg by mouth 3 (three) times daily as needed for dizziness or nausea.     omeprazole 40 MG capsule  Commonly known as:  PRILOSEC  Take 1 capsule (40 mg total) by mouth daily.     ondansetron 4 MG tablet  Commonly known as:  ZOFRAN  Take 1 tablet (4 mg total) by mouth every 8 (eight) hours as needed for nausea.     traMADol 50 MG tablet  Commonly known as:  ULTRAM  Take 1 tablet (50 mg total) by mouth every 8 (eight) hours as needed for moderate pain.        Meds  ordered this encounter  Medications  . traMADol (ULTRAM) 50 MG tablet    Sig: Take 1 tablet (50 mg total) by mouth every 8 (eight) hours as needed for moderate pain.    Dispense:  90 tablet    Refill:  0    Immunization History  Administered Date(s) Administered  . Influenza Whole 04/01/2009, 06/05/2010  . Influenza,inj,Quad PF,36+ Mos 04/27/2014  . Td 07/07/2007    History reviewed. No pertinent family history.  History  Substance Use Topics  . Smoking status: Never Smoker   . Smokeless tobacco: Not on file  . Alcohol Use: No    Review of Systems   As noted in HPI  Filed Vitals:   10/26/14 1107  BP: 130/80  Pulse:   Temp:   Resp:     Physical Exam  Physical Exam  Constitutional: No distress.  Eyes: EOM are normal. Pupils are equal, round, and reactive to light.  Cardiovascular: Normal rate and regular rhythm.   Pulmonary/Chest: Breath sounds normal. No respiratory distress. She has no wheezes. She has no rales.  Musculoskeletal: She exhibits no edema.    CBC    Component Value  Date/Time   WBC 4.3 07/06/2014 1023   WBC 4.9 11/21/2010 1336   RBC 4.27 07/06/2014 1023   RBC 4.27 07/06/2014 1023   RBC 3.82 11/21/2010 1336   HGB 10.9* 07/06/2014 1023   HGB 10.2* 11/21/2010 1336   HCT 32.4* 07/06/2014 1023   HCT 29.2* 11/21/2010 1336   PLT 170 07/06/2014 1023   PLT 149 11/21/2010 1336   MCV 75.9* 07/06/2014 1023   MCV 76.3* 11/21/2010 1336   LYMPHSABS 1.5 07/06/2014 1023   LYMPHSABS 1.6 11/21/2010 1336   MONOABS 0.3 07/06/2014 1023   MONOABS 0.3 11/21/2010 1336   EOSABS 0.0 07/06/2014 1023   EOSABS 0.1 11/21/2010 1336   BASOSABS 0.0 07/06/2014 1023   BASOSABS 0.0 11/21/2010 1336    CMP     Component Value Date/Time   NA 140 07/06/2014 1023   K 3.8 07/06/2014 1023   CL 105 07/06/2014 1023   CO2 28 07/06/2014 1023   GLUCOSE 74 07/06/2014 1023   BUN 9 07/06/2014 1023   CREATININE 0.76 07/06/2014 1023   CREATININE 0.76 02/18/2014 1250    CALCIUM 9.0 07/06/2014 1023   PROT 7.3 07/06/2014 1023   ALBUMIN 4.3 07/06/2014 1023   AST 16 07/06/2014 1023   ALT 11 07/06/2014 1023   ALKPHOS 83 07/06/2014 1023   BILITOT 1.2 07/06/2014 1023   GFRNONAA 88 07/06/2014 1023   GFRNONAA >90 02/18/2014 1250   GFRAA >89 07/06/2014 1023   GFRAA >90 02/18/2014 1250    Lab Results  Component Value Date/Time   CHOL 162 01/05/2014 09:12 AM    No results found for: HGBA1C  Lab Results  Component Value Date/Time   AST 16 07/06/2014 10:23 AM    Assessment and Plan  Essential hypertension - Plan: continue with current medication, will repeat blood chemistry COMPLETE METABOLIC PANEL WITH GFR  Chronic pain syndrome - Plan: traMADol (ULTRAM) 50 MG tablet  History of anemia - Plan: Hemoglobinopathy evaluation, will repeat CBC CBC with Differential/Platelet   Health Maintenance  -Pap Smear:will be scheduled -Mammogram:up-to-date   Return in about 3 months (around 01/25/2015), or if symptoms worsen or fail to improve, for Schedule Appt with Dr Glendora ScoreFunches/ Valerie for PAP.   This note has been created with Education officer, environmentalDragon speech recognition software and smart phrase technology. Any transcriptional errors are unintentional.    Doris CheadleADVANI, Rawson Minix, MD

## 2014-10-27 LAB — COMPLETE METABOLIC PANEL WITH GFR
ALK PHOS: 82 U/L (ref 39–117)
ALT: 10 U/L (ref 0–35)
AST: 15 U/L (ref 0–37)
Albumin: 4.3 g/dL (ref 3.5–5.2)
BILIRUBIN TOTAL: 1.2 mg/dL (ref 0.2–1.2)
BUN: 9 mg/dL (ref 6–23)
CO2: 27 mEq/L (ref 19–32)
CREATININE: 0.74 mg/dL (ref 0.50–1.10)
Calcium: 9.2 mg/dL (ref 8.4–10.5)
Chloride: 107 mEq/L (ref 96–112)
GFR, Est African American: >89 mL/min
GLUCOSE: 122 mg/dL — AB (ref 70–99)
Potassium: 4.6 mEq/L (ref 3.5–5.3)
Sodium: 140 mEq/L (ref 135–145)
Total Protein: 7.3 g/dL (ref 6.0–8.3)

## 2014-10-28 LAB — HEMOGLOBINOPATHY EVALUATION
HEMOGLOBIN OTHER: 0 %
HGB A: 0 % — AB (ref 96.8–97.8)
HGB S QUANTITAION: 52.1 % — AB
Hgb A2 Quant: 3 % (ref 2.2–3.2)
Hgb F Quant: 0.3 % (ref 0.0–2.0)

## 2014-11-02 LAB — HGB ELECTROPHORESIS REFLEXED REPORT
HEMOGLOBIN ELECT C: 45.1 % — AB
HEMOGLOBIN F - HGBRFX: 0.2 % (ref <2.0)
Hemoglobin A - HGBRFX: 0 % — ABNORMAL LOW (ref >96.0)
Hemoglobin A2 - HGBRFX: 4.2 % — ABNORMAL HIGH (ref 1.8–3.5)
Hemoglobin S - HGBRFX: 50.5 % — ABNORMAL HIGH
SICKLE SOLUBILITY TEST - HGBRFX: POSITIVE — AB

## 2014-11-03 ENCOUNTER — Telehealth: Payer: Self-pay

## 2014-11-03 NOTE — Telephone Encounter (Signed)
-----   Message from Dessa PhiJosalyn Funches, MD sent at 11/03/2014  8:51 AM EDT ----- Patient with zero Hgb A and high Hgb S consistent wit sickle cell ([place]) disease

## 2014-11-03 NOTE — Telephone Encounter (Signed)
Patient is aware of her lab results 

## 2014-11-22 ENCOUNTER — Other Ambulatory Visit: Payer: Self-pay | Admitting: Internal Medicine

## 2014-11-25 ENCOUNTER — Telehealth: Payer: Self-pay | Admitting: Internal Medicine

## 2014-11-25 DIAGNOSIS — G894 Chronic pain syndrome: Secondary | ICD-10-CM

## 2014-11-25 MED ORDER — TRAMADOL HCL 50 MG PO TABS: 50.0000 mg | ORAL_TABLET | Freq: Three times a day (TID) | ORAL | Status: DC | PRN

## 2014-11-25 NOTE — Telephone Encounter (Signed)
Patient called to request a med refill for Tramadol, patient uses [facility]. Please f/u

## 2014-11-25 NOTE — Telephone Encounter (Signed)
Patient called requesting a refill on her tramadol Prescription printed and is ready for pick up at the front desk

## 2014-12-08 ENCOUNTER — Other Ambulatory Visit: Payer: Self-pay | Admitting: Internal Medicine

## 2014-12-27 ENCOUNTER — Telehealth: Payer: Self-pay

## 2014-12-27 ENCOUNTER — Telehealth: Payer: Self-pay | Admitting: Internal Medicine

## 2014-12-27 DIAGNOSIS — K219 Gastro-esophageal reflux disease without esophagitis: Secondary | ICD-10-CM

## 2014-12-27 DIAGNOSIS — M62830 Muscle spasm of back: Secondary | ICD-10-CM

## 2014-12-27 DIAGNOSIS — G894 Chronic pain syndrome: Secondary | ICD-10-CM

## 2014-12-27 DIAGNOSIS — M549 Dorsalgia, unspecified: Secondary | ICD-10-CM

## 2014-12-27 MED ORDER — GABAPENTIN 100 MG PO CAPS: 100.0000 mg | ORAL_CAPSULE | Freq: Three times a day (TID) | ORAL | Status: DC

## 2014-12-27 MED ORDER — TRAMADOL HCL 50 MG PO TABS: 50.0000 mg | ORAL_TABLET | Freq: Three times a day (TID) | ORAL | Status: DC | PRN

## 2014-12-27 MED ORDER — OMEPRAZOLE 40 MG PO CPDR: 40.0000 mg | DELAYED_RELEASE_CAPSULE | Freq: Every day | ORAL | Status: DC

## 2014-12-27 NOTE — Telephone Encounter (Signed)
Patient called requesting medication refill on all current medications.omeprazole (PRILOSEC) 40 MG capsule,traMADol (ULTRAM) 50 MG tablet,gabapentin (NEURONTIN) 100 MG capsule.Please f/u

## 2014-12-27 NOTE — Telephone Encounter (Signed)
Patient is aware her prescription is ready for pick up

## 2014-12-28 ENCOUNTER — Telehealth: Payer: Self-pay | Admitting: Internal Medicine

## 2014-12-28 ENCOUNTER — Telehealth: Payer: Self-pay

## 2014-12-28 ENCOUNTER — Other Ambulatory Visit: Payer: Self-pay | Admitting: Internal Medicine

## 2014-12-28 DIAGNOSIS — I10 Essential (primary) hypertension: Secondary | ICD-10-CM

## 2014-12-28 MED ORDER — LOSARTAN POTASSIUM 100 MG PO TABS: 100.0000 mg | ORAL_TABLET | Freq: Every day | ORAL | Status: DC

## 2014-12-28 NOTE — Telephone Encounter (Signed)
Patient clarified that she is needing a refill for losartan (COZAAR) 100 MG tablet. Please follow up with pt.

## 2014-12-28 NOTE — Telephone Encounter (Signed)
Patient says she would like to be prescribed losartan (COZAAR) 100 MG tablet instead. Please follow up with pt.

## 2014-12-28 NOTE — Telephone Encounter (Signed)
Pt is stating that gabapentin (NEURONTIN) 100 MG capsule is not working for her. Please follow up with patient.

## 2014-12-28 NOTE — Telephone Encounter (Signed)
Patient had come into office requesting a refill on her losartan Medication sent to pharmacy Attempted to call patient to let her know but got her voice mail Left message for her to return our call

## 2015-01-24 ENCOUNTER — Telehealth: Payer: Self-pay | Admitting: Internal Medicine

## 2015-01-24 ENCOUNTER — Telehealth: Payer: Self-pay

## 2015-01-24 DIAGNOSIS — G894 Chronic pain syndrome: Secondary | ICD-10-CM

## 2015-01-24 NOTE — Telephone Encounter (Signed)
Pt is calling to request a refill on traMADol (ULTRAM) 50 MG tablet. Please follow up with pt. Thank you.

## 2015-01-24 NOTE — Telephone Encounter (Signed)
Patient called for refill on her tramadol Instructed patient that it can not be refilled for another couple of days Patient agreed and will call back in two days to have it refilled

## 2015-01-26 MED ORDER — TRAMADOL HCL 50 MG PO TABS: 50.0000 mg | ORAL_TABLET | Freq: Three times a day (TID) | ORAL | Status: DC | PRN

## 2015-01-26 NOTE — Telephone Encounter (Signed)
Patient called to request a med refill for her Tramadol, please f/u with pt.

## 2015-01-26 NOTE — Telephone Encounter (Signed)
Patient called for refill on her tramadol Prescription printed and is at the front desk

## 2015-03-02 ENCOUNTER — Telehealth: Payer: Self-pay

## 2015-03-02 DIAGNOSIS — G894 Chronic pain syndrome: Secondary | ICD-10-CM

## 2015-03-02 MED ORDER — TRAMADOL HCL 50 MG PO TABS: 50.0000 mg | ORAL_TABLET | Freq: Three times a day (TID) | ORAL | Status: DC | PRN

## 2015-03-02 NOTE — Telephone Encounter (Signed)
Patient called requesting a refill on her tramadol Prescription printed and is available at the front desk

## 2015-03-08 ENCOUNTER — Other Ambulatory Visit: Payer: Self-pay | Admitting: Internal Medicine

## 2015-03-10 ENCOUNTER — Telehealth: Payer: Self-pay

## 2015-03-10 ENCOUNTER — Telehealth: Payer: Self-pay | Admitting: Internal Medicine

## 2015-03-10 NOTE — Telephone Encounter (Signed)
Patient called requesting to speak to nurse, please f/u °

## 2015-03-11 ENCOUNTER — Other Ambulatory Visit: Payer: Self-pay | Admitting: *Deleted

## 2015-03-11 DIAGNOSIS — G894 Chronic pain syndrome: Secondary | ICD-10-CM

## 2015-03-16 NOTE — Telephone Encounter (Signed)
Patient filled tramadol on 03/11/15 Will be due for refill on 04/10/15

## 2015-04-04 ENCOUNTER — Telehealth: Payer: Self-pay | Admitting: Internal Medicine

## 2015-04-04 NOTE — Telephone Encounter (Signed)
Patient called requesting med refill on traMADol (ULTRAM) 50 MG tablet. Please f/u with pt. °

## 2015-04-06 NOTE — Telephone Encounter (Signed)
Patient returned phone call, please f/u °

## 2015-04-06 NOTE — Telephone Encounter (Signed)
LVM to return call    Pt need OV for refills, last visit on 10/26/2014

## 2015-04-07 NOTE — Telephone Encounter (Signed)
Pt notified need OV for refills last visit 10/26/2014 Call transfer to front office for appointment

## 2015-04-19 ENCOUNTER — Ambulatory Visit: Payer: Self-pay | Attending: Internal Medicine | Admitting: Internal Medicine

## 2015-04-19 ENCOUNTER — Encounter: Payer: Self-pay | Admitting: Internal Medicine

## 2015-04-19 VITALS — BP 132/88 | HR 81 | Temp 98.0°F | Resp 16 | Ht 64.0 in | Wt 174.0 lb

## 2015-04-19 DIAGNOSIS — I10 Essential (primary) hypertension: Secondary | ICD-10-CM | POA: Insufficient documentation

## 2015-04-19 DIAGNOSIS — Z0000 Encounter for general adult medical examination without abnormal findings: Secondary | ICD-10-CM

## 2015-04-19 DIAGNOSIS — D5700 Hb-SS disease with crisis, unspecified: Secondary | ICD-10-CM | POA: Insufficient documentation

## 2015-04-19 DIAGNOSIS — Z23 Encounter for immunization: Secondary | ICD-10-CM | POA: Insufficient documentation

## 2015-04-19 DIAGNOSIS — G894 Chronic pain syndrome: Secondary | ICD-10-CM | POA: Insufficient documentation

## 2015-04-19 DIAGNOSIS — Z79899 Other long term (current) drug therapy: Secondary | ICD-10-CM | POA: Insufficient documentation

## 2015-04-19 MED ORDER — LOSARTAN POTASSIUM 100 MG PO TABS: 100.0000 mg | ORAL_TABLET | Freq: Every day | ORAL | Status: DC

## 2015-04-19 MED ORDER — TRAMADOL HCL 50 MG PO TABS: 50.0000 mg | ORAL_TABLET | Freq: Two times a day (BID) | ORAL | Status: DC | PRN

## 2015-04-19 NOTE — Progress Notes (Signed)
Patient ID: Kayla Renkouele Adamski, female   DOB: 10-May-1956, 59 y.o.   MRN: [id number] Subjective:  Kayla Woodard is a 59 y.o. female with hypertension and sickle cell anemia. Patient reports that she takes her Tramadol daily and wants to know if she can have a refill on her medication so that she does not have to come up here monthly. She does not have a hematologist but states that she was [facility] years ago when she moved to the [place]. Pain is generalized and she attempts to stay hydrated to prevent a crisis. She takes her blood pressure medication daily without any complications. She denies symptoms of chest pain, SOB, edema, palpitations. Current Outpatient Prescriptions  Medication Sig Dispense Refill  . gabapentin (NEURONTIN) 100 MG capsule Take 1 capsule (100 mg total) by mouth 3 (three) times daily. 90 capsule 3  . losartan (COZAAR) 100 MG tablet Take 1 tablet (100 mg total) by mouth daily. 90 tablet 2  . omeprazole (PRILOSEC) 40 MG capsule Take 1 capsule (40 mg total) by mouth daily. 30 capsule 2  . traMADol (ULTRAM) 50 MG tablet Take 1 tablet (50 mg total) by mouth every 8 (eight) hours as needed for moderate pain. 90 tablet 0   No current facility-administered medications for this visit.    Hypertension ROS: taking medications as instructed, no medication side effects noted, no TIA's, no chest pain on exertion, no dyspnea on exertion, no swelling of ankles and no palpitations.   Objective:  BP 145/84 mmHg  Pulse 81  Temp(Src) 98 F (36.7 C)  Resp 16  Ht 5\' 4"  (1.626 m)  Wt 174 lb (78.926 kg)  BMI 29.85 kg/m2  SpO2 100%  Appearance alert, well appearing, and in no distress, oriented to person, place, and time and overweight. General exam BP noted to be well controlled today in office, S1, S2 normal, no gallop, no murmur, chest clear, no JVD, no HSM, no edema. No joint redness or tenderness.  Lab review: labs are reviewed, up to date and normal.    Imaan was seen today for follow-up.  Diagnoses and all orders for this visit:  Essential hypertension -     losartan (COZAAR) 100 MG tablet; Take 1 tablet (100 mg total) by mouth daily. Patient blood pressure is stable and may continue on current medication.  Education on diet, exercise, and modifiable risk factors discussed. Will obtain appropriate labs as needed. Will follow up in 3-6 months.   Hb-SS disease with crisis [facility](HCC) -     Ambulatory referral to Hematology  Chronic pain syndrome -     traMADol (ULTRAM) 50 MG tablet; Take 1 tablet (50 mg total) by mouth every 12 (twelve) hours as needed for moderate pain. Related to Sickle Cell  Healthcare maintenance -     Flu Vaccine QUAD 36+ mos PF IM (Fluarix & Fluzone Quad PF)   Return in about 6 months (around 10/18/2015) for Hypertension.  Ambrose FinlandValerie A Keck, NP 04/20/2015 7:55 PM

## 2015-04-19 NOTE — Progress Notes (Signed)
Patient here for follow up Patient suffers from generalized pain all over  And currently taking tramadol

## 2015-05-06 ENCOUNTER — Other Ambulatory Visit: Payer: Self-pay | Admitting: Internal Medicine

## 2015-05-13 ENCOUNTER — Telehealth: Payer: Self-pay

## 2015-05-13 ENCOUNTER — Telehealth: Payer: Self-pay | Admitting: Internal Medicine

## 2015-05-13 NOTE — Telephone Encounter (Signed)
Patient called stating that she is supposed to get #90 tablets of Tramadol and she only received #60. Please f/u

## 2015-05-13 NOTE — Telephone Encounter (Signed)
Returned patient phone call [facility] had stated [facility] only gave her  Fifty tramadol-not ninety as prescribed  Informed patient she would need to call the pharmacy and speak with them

## 2015-05-16 ENCOUNTER — Other Ambulatory Visit: Payer: Self-pay

## 2015-05-16 NOTE — Telephone Encounter (Signed)
Patient called and requested to speak to you, patient stated that she is out of her medications and needs a refill.Please f/u

## 2015-05-16 NOTE — Progress Notes (Unsigned)
Patient came into office requesting a refill on her tramadol RX was written on 04/19/15 for every twelve hours Patient has been taking three a day and is currently out of the medication Tried to explain to the patient that it is too soon to refill [facility] when the RX was written for #90 Spoke with ashley in the pharmacy and they can fill the rest of it tomorrow

## 2015-06-28 NOTE — Telephone Encounter (Signed)
error 

## 2015-07-08 ENCOUNTER — Other Ambulatory Visit: Payer: Self-pay | Admitting: Internal Medicine

## 2015-07-08 MED FILL — LOSARTAN POTASSIUM 100 MG T: 100 | 30 days supply | Qty: 30 | Fill #6

## 2015-07-08 MED FILL — ?OMEPRAZOLE DR 20 MG CAPSUL: 20 | 30 days supply | Qty: 60 | Fill #0

## 2015-07-12 ENCOUNTER — Other Ambulatory Visit: Payer: Self-pay

## 2015-07-12 ENCOUNTER — Telehealth: Payer: Self-pay | Admitting: Internal Medicine

## 2015-07-12 DIAGNOSIS — G894 Chronic pain syndrome: Secondary | ICD-10-CM

## 2015-07-12 MED ORDER — TRAMADOL HCL 50 MG PO TABS: 50.0000 mg | ORAL_TABLET | Freq: Two times a day (BID) | ORAL | Status: DC | PRN

## 2015-07-12 NOTE — Telephone Encounter (Signed)
Pt. Came in requesting a med refill on Tramadol. Please f/u with pt.

## 2015-07-13 ENCOUNTER — Telehealth: Payer: Self-pay | Admitting: Internal Medicine

## 2015-07-13 ENCOUNTER — Telehealth: Payer: Self-pay

## 2015-07-13 NOTE — Telephone Encounter (Signed)
Pt. Came in stating that her prescription for Tramadol is wrong. Please f/u with pt.

## 2015-07-13 NOTE — Telephone Encounter (Signed)
Spoke with Arlys JohnBrian at the [facility] Patient presented at their pharmacy with a prescription for Tramadol that had  Been altered from being prescribed every 12 hours PRN to hand written every eight hours PRN Pharmacist had questioned the fact that when  an RX is changed it is usually initialled i explained that any controlled substances that are written from this office are never changed in writing All controlled substance prescriptions are printed and patient has to sign for it at the front desk Patient returned to our office with her prescription and wanted it rewritten for the way she wanted to take it.  Copy of altered document copied and will be scanned into patient chart

## 2015-07-13 NOTE — Telephone Encounter (Signed)
Patient dropped off SCAT paperwork to be completed by the doctor. Please follow up.

## 2015-07-18 ENCOUNTER — Telehealth: Payer: Self-pay

## 2015-07-18 NOTE — Telephone Encounter (Signed)
Called patient this am  Patient is aware her provider will no longer write prescriptions for her tramadol  Due to the fact the original RX was tampered with Per office manager patient can be seen at the sickle cell clinic

## 2015-07-19 ENCOUNTER — Encounter (HOSPITAL_COMMUNITY): Payer: Self-pay | Admitting: *Deleted

## 2015-07-19 ENCOUNTER — Emergency Department (HOSPITAL_COMMUNITY)
Admission: EM | Admit: 2015-07-19 | Discharge: 2015-07-19 | Disposition: A | Discharge status: Home / Self Care | Payer: Self-pay | Attending: Emergency Medicine | Admitting: Emergency Medicine

## 2015-07-19 DIAGNOSIS — G8929 Other chronic pain: Secondary | ICD-10-CM | POA: Insufficient documentation

## 2015-07-19 DIAGNOSIS — Z862 Personal history of diseases of the blood and blood-forming organs and certain disorders involving the immune mechanism: Secondary | ICD-10-CM | POA: Insufficient documentation

## 2015-07-19 DIAGNOSIS — I10 Essential (primary) hypertension: Secondary | ICD-10-CM | POA: Insufficient documentation

## 2015-07-19 DIAGNOSIS — Z7982 Long term (current) use of aspirin: Secondary | ICD-10-CM | POA: Insufficient documentation

## 2015-07-19 DIAGNOSIS — Z79899 Other long term (current) drug therapy: Secondary | ICD-10-CM | POA: Insufficient documentation

## 2015-07-19 DIAGNOSIS — D5700 Hb-SS disease with crisis, unspecified: Secondary | ICD-10-CM | POA: Insufficient documentation

## 2015-07-19 LAB — CBC WITH DIFFERENTIAL/PLATELET
BASOS ABS: 0 10*3/uL (ref 0.0–0.1)
Basophils Relative: 0 %
EOS ABS: 0.1 10*3/uL (ref 0.0–0.7)
EOS PCT: 2 %
HCT: 28.1 % — ABNORMAL LOW (ref 36.0–46.0)
Hemoglobin: 10.3 g/dL — ABNORMAL LOW (ref 12.0–15.0)
LYMPHS PCT: 33 %
Lymphs Abs: 1.3 10*3/uL (ref 0.7–4.0)
MCH: 26.1 pg (ref 26.0–34.0)
MCHC: 36.7 g/dL — ABNORMAL HIGH (ref 30.0–36.0)
MCV: 71.3 fL — AB (ref 78.0–100.0)
Monocytes Absolute: 0.2 10*3/uL (ref 0.1–1.0)
Monocytes Relative: 6 %
Neutro Abs: 2.4 10*3/uL (ref 1.7–7.7)
Neutrophils Relative %: 60 %
Platelets: 134 10*3/uL — ABNORMAL LOW (ref 150–400)
RBC: 3.94 MIL/uL (ref 3.87–5.11)
RDW: 17.9 % — ABNORMAL HIGH (ref 11.5–15.5)
WBC: 4 10*3/uL (ref 4.0–10.5)

## 2015-07-19 LAB — COMPREHENSIVE METABOLIC PANEL
ALBUMIN: 3.7 g/dL (ref 3.5–5.0)
ALT: 15 U/L (ref 14–54)
AST: 18 U/L (ref 15–41)
Alkaline Phosphatase: 85 U/L (ref 38–126)
Anion gap: 9 (ref 5–15)
BILIRUBIN TOTAL: 1.3 mg/dL — AB (ref 0.3–1.2)
BUN: 7 mg/dL (ref 6–20)
CHLORIDE: 107 mmol/L (ref 101–111)
CO2: 25 mmol/L (ref 22–32)
CREATININE: 0.83 mg/dL (ref 0.44–1.00)
Calcium: 9.1 mg/dL (ref 8.9–10.3)
GFR calc Af Amer: >60 mL/min (ref >60)
GLUCOSE: 109 mg/dL — AB (ref 65–99)
Potassium: 3.5 mmol/L (ref 3.5–5.1)
Sodium: 141 mmol/L (ref 135–145)
TOTAL PROTEIN: 6.8 g/dL (ref 6.5–8.1)

## 2015-07-19 MED ORDER — TRAMADOL HCL 50 MG PO TABS
50.0000 mg | ORAL_TABLET | Freq: Once | ORAL | Status: AC
  Administered 2015-07-19: 50 mg via ORAL
  Filled 2015-07-19: qty 1

## 2015-07-19 MED ORDER — TRAMADOL HCL 50 MG PO TABS: 50.0000 mg | ORAL_TABLET | Freq: Three times a day (TID) | ORAL | Status: DC | PRN

## 2015-07-19 MED FILL — traMADol HCL 50 MG TABS: 50 | 7 days supply | Qty: 21 | Fill #0

## 2015-07-19 NOTE — ED Notes (Signed)
MD at bedside. 

## 2015-07-19 NOTE — Progress Notes (Signed)
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 Patient we linked with a [facility] case manager to assist with medication needs and other concerns upon discharge. My contact information provided for any future questions or concerns. No other [facility] Specialist needs identified at this time.  Buddy Duty [employer] Specialist [facility] [phone]

## 2015-07-19 NOTE — ED Notes (Signed)
Pt presents via POV c/o generalized pain x 2 days, hx sickle cell anemia.  Pt denies SOB, CP.  Pt ambulatory on arrival.  Pt a x 4, NAD.

## 2015-07-19 NOTE — ED Provider Notes (Signed)
CSN: [id number]     Arrival date & time 07/19/15  0731 History   First MD Initiated Contact with Patient 07/19/15 [phone]     Chief Complaint  Patient presents with  . Sickle Cell Pain Crisis     (Consider location/radiation/quality/duration/timing/severity/associated sxs/prior Treatment) Patient is a 61 y.o. female presenting with general illness.  Illness Location:  All over Quality:  Achy Severity:  Mild Onset quality:  Gradual Duration:  2 days Timing:  Constant Progression:  Worsening Chronicity:  New Associated symptoms: fatigue   Associated symptoms: no chest pain, no cough, no fever, no loss of consciousness, no shortness of breath, no vomiting and no wheezing     Past Medical History  Diagnosis Date  . Anemia   . Back pain, chronic   . Hypertension   . Sickle cell anemia (HCC)    History reviewed. No pertinent past surgical history. No family history on file. Social History  Substance Use Topics  . Smoking status: Never Smoker   . Smokeless tobacco: None  . Alcohol Use: No   OB History    No data available     Review of Systems  Constitutional: Positive for fatigue. Negative for fever.  Eyes: Negative for pain and redness.  Respiratory: Negative for cough, shortness of breath and wheezing.   Cardiovascular: Negative for chest pain.  Gastrointestinal: Negative for vomiting.  Neurological: Negative for loss of consciousness.  All other systems reviewed and are negative.     Allergies  Oxycodone and Vicodin  Home Medications   Prior to Admission medications   Medication Sig Start Date End Date Taking? Authorizing Provider  aspirin EC 81 MG tablet Take 81 mg by mouth daily.   Yes Historical Provider, MD  losartan (COZAAR) 100 MG tablet Take 1 tablet (100 mg total) by mouth daily. 04/19/15  Yes Ambrose Finland, NP  Multiple Vitamin (MULTIVITAMIN WITH MINERALS) TABS tablet Take 1 tablet by mouth daily.   Yes Historical Provider, MD  omeprazole  (PRILOSEC) 20 MG capsule TAKE 2 CAPSULES BY MOUTH DAILY 07/08/15  Yes Ambrose Finland, NP  gabapentin (NEURONTIN) 100 MG capsule TAKE 1 CAPSULE BY MOUTH 3 TIMES DAILY. Patient not taking: Reported on 07/19/2015 05/06/15   Ambrose Finland, NP  traMADol (ULTRAM) 50 MG tablet Take 1 tablet (50 mg total) by mouth every 8 (eight) hours as needed for severe pain. 07/19/15   Barbara Cower Talor Cheema, MD   BP 126/95 mmHg  Pulse 76  Temp(Src) 98.3 F (36.8 C) (Oral)  Resp 18  Ht  (1.6 m)  Wt 168 lb (76.204 kg)  BMI 29.77 kg/m2  SpO2 100% Physical Exam  Constitutional: She appears well-developed and well-nourished.  HENT:  Head: Normocephalic and atraumatic.  Neck: Normal range of motion.  Cardiovascular: Normal rate and regular rhythm.   Pulmonary/Chest: No stridor. No respiratory distress.  Abdominal: She exhibits no distension.  Neurological: She is alert.  Nursing note and vitals reviewed.   ED Course  Procedures (including critical care time) Labs Review Labs Reviewed  COMPREHENSIVE METABOLIC PANEL - Abnormal; Notable for the following:    Glucose, Bld 109 (*)    Total Bilirubin 1.3 (*)    All other components within normal limits  CBC WITH DIFFERENTIAL/PLATELET - Abnormal; Notable for the following:    Hemoglobin 10.3 (*)    HCT 28.1 (*)    MCV 71.3 (*)    MCHC 36.7 (*)    RDW 17.9 (*)    Platelets 134 (*)  All other components within normal limits    Imaging Review No results found. I have personally reviewed and evaluated these images and lab results as part of my medical decision-making.   EKG Interpretation None      MDM   Final diagnoses:  Hb-SS disease with crisis [facility])   Patient with typical sickle cell pain of dull achy pain all over her body. She's been out of her Ultram for about a week he can I get refills through her clinic. I gave her one Ultram and her symptoms improved. Hemoglobin seems to be at baseline. No white count. No suspicion for trauma or other causes  for her pain. We'll give her a week prescription of tramadol. Also discussed case management who saw the patient and will assist in getting the patient of closer follow-up with either pain management or new primary doctor.      Marily Memos, MD 07/19/15 1124

## 2015-07-28 ENCOUNTER — Telehealth: Payer: Self-pay | Admitting: Internal Medicine

## 2015-07-28 NOTE — Telephone Encounter (Signed)
Patient is informed that paperwork for [facility] is completed for pick up

## 2015-08-01 ENCOUNTER — Encounter: Payer: Self-pay | Admitting: Internal Medicine

## 2015-08-01 ENCOUNTER — Ambulatory Visit: Payer: Self-pay | Attending: Internal Medicine | Admitting: Internal Medicine

## 2015-08-01 VITALS — BP 151/91 | HR 93 | Temp 98.0°F | Resp 16 | Ht 64.0 in | Wt 175.0 lb

## 2015-08-01 DIAGNOSIS — Z888 Allergy status to other drugs, medicaments and biological substances status: Secondary | ICD-10-CM | POA: Insufficient documentation

## 2015-08-01 DIAGNOSIS — G8929 Other chronic pain: Secondary | ICD-10-CM | POA: Insufficient documentation

## 2015-08-01 DIAGNOSIS — Z79899 Other long term (current) drug therapy: Secondary | ICD-10-CM | POA: Insufficient documentation

## 2015-08-01 DIAGNOSIS — I10 Essential (primary) hypertension: Secondary | ICD-10-CM | POA: Insufficient documentation

## 2015-08-01 DIAGNOSIS — Z7982 Long term (current) use of aspirin: Secondary | ICD-10-CM | POA: Insufficient documentation

## 2015-08-01 DIAGNOSIS — R52 Pain, unspecified: Secondary | ICD-10-CM | POA: Insufficient documentation

## 2015-08-01 DIAGNOSIS — D571 Sickle-cell disease without crisis: Secondary | ICD-10-CM | POA: Insufficient documentation

## 2015-08-01 DIAGNOSIS — D5700 Hb-SS disease with crisis, unspecified: Secondary | ICD-10-CM | POA: Insufficient documentation

## 2015-08-01 MED ORDER — TRAMADOL HCL 50 MG PO TABS: ORAL_TABLET | ORAL | Status: DC

## 2015-08-01 MED FILL — traMADol HCL 50 MG TABS: 50 | 30 days supply | Qty: 90 | Fill #0

## 2015-08-01 NOTE — Progress Notes (Signed)
Patient ID: Kayla Woodard, female   DOB: Oct 30, 1955, 60 y.o.   MRN: [id number]  CC: generalized pain  HPI: Kayla Woodard is a 60 y.o. female here today for a follow up visit.  Patient has past medical history of hypertension and sickle cell. Patient reports that she has been in pain since being taken off her Tramadol. She reports that her pain caused her to go to the emergency department several days ago and she is now out of the prescribed Tramadol. Patient is requesting medication refills. She does not have a establish sickle cell center because she was discharged from the facility due to "immigration paperwork". Pain is all over her body and is achy.  Allergies  Allergen Reactions  . Oxycodone Itching  . Vicodin [Hydrocodone-Acetaminophen] Itching   Past Medical History  Diagnosis Date  . Anemia   . Back pain, chronic   . Hypertension   . Sickle cell anemia (HCC)    Current Outpatient Prescriptions on File Prior to Visit  Medication Sig Dispense Refill  . aspirin EC 81 MG tablet Take 81 mg by mouth daily.    Marland Kitchen losartan (COZAAR) 100 MG tablet Take 1 tablet (100 mg total) by mouth daily. 90 tablet 2  . Multiple Vitamin (MULTIVITAMIN WITH MINERALS) TABS tablet Take 1 tablet by mouth daily.    Marland Kitchen omeprazole (PRILOSEC) 20 MG capsule TAKE 2 CAPSULES BY MOUTH DAILY 60 capsule 2  . traMADol (ULTRAM) 50 MG tablet Take 1 tablet (50 mg total) by mouth every 8 (eight) hours as needed for severe pain. 21 tablet 0  . gabapentin (NEURONTIN) 100 MG capsule TAKE 1 CAPSULE BY MOUTH 3 TIMES DAILY. (Patient not taking: Reported on 07/19/2015) 90 capsule 3   No current facility-administered medications on file prior to visit.   History reviewed. No pertinent family history. Social History   Social History  . Marital Status: Married    Spouse Name: N/A  . Number of Children: N/A  . Years of Education: N/A   Occupational History  . Not on file.   Social History Main Topics  . Smoking status:  Never Smoker   . Smokeless tobacco: Not on file  . Alcohol Use: No  . Drug Use: No  . Sexual Activity: No   Other Topics Concern  . Not on file   Social History Narrative    Review of Systems: Constitutional: Negative for fever, chills, diaphoresis, activity change, appetite change and fatigue. HENT: Negative for ear pain, nosebleeds, congestion, facial swelling, rhinorrhea, neck pain, neck stiffness and ear discharge.  Eyes: Negative for pain, discharge, redness, itching and visual disturbance. Respiratory: Negative for cough, choking, chest tightness, shortness of breath, wheezing and stridor.  Cardiovascular: Negative for chest pain, palpitations and leg swelling. Gastrointestinal: Negative for abdominal distention. Genitourinary: Negative for dysuria, urgency, frequency, hematuria, flank pain, decreased urine volume, difficulty urinating and dyspareunia.  Musculoskeletal: Negative for back pain, joint swelling, arthralgias and gait problem. Neurological: Negative for dizziness, tremors, seizures, syncope, facial asymmetry, speech difficulty, weakness, light-headedness, numbness and headaches.  Hematological: Negative for adenopathy. Does not bruise/bleed easily. Psychiatric/Behavioral: Negative for hallucinations, behavioral problems, confusion, dysphoric mood, decreased concentration and agitation.    Objective:   Filed Vitals:   08/01/15 0939  BP: 151/91  Pulse: 93  Temp: 98 F (36.7 C)  Resp: 16    Physical Exam  Constitutional: She is oriented to person, place, and time.  Cardiovascular: Normal rate, regular rhythm and normal heart sounds.   Pulmonary/Chest: Effort normal  and breath sounds normal.  Musculoskeletal: Normal range of motion. She exhibits no edema.  Neurological: She is alert and oriented to person, place, and time.   Lab Results  Component Value Date   WBC 4.0 07/19/2015   HGB 10.3* 07/19/2015   HCT 28.1* 07/19/2015   MCV 71.3* 07/19/2015   PLT  134* 07/19/2015   Lab Results  Component Value Date   CREATININE 0.83 07/19/2015   BUN 7 07/19/2015   NA 141 07/19/2015   K 3.5 07/19/2015   CL 107 07/19/2015   CO2 25 07/19/2015    No results found for: HGBA1C Lipid Panel     Component Value Date/Time   CHOL 162 01/05/2014 0912   TRIG 46 01/05/2014 0912   HDL 51 01/05/2014 0912   CHOLHDL 3.2 01/05/2014 0912   VLDL 9 01/05/2014 0912   LDLCALC 102* 01/05/2014 0912       Assessment and plan:   Kayla Woodard was seen today for follow-up.  Diagnoses and all orders for this visit:  Generalized pain -     traMADol (ULTRAM) 50 MG tablet; Take three times daily Explained to patient that if I find signs that any prescription written from this office is altered again then she will be discharged from the practice. I have agreed to write her Tramadol but it will be given directly to our pharmacy her in clinic. It will not go directly to patient.   Hb-SS disease with crisis (HCC) See above. Stressed that she find a proper sickle cell clinic to manage her pain and crisis.   No Follow-up on file.       Ambrose Finland, NP-C [facility] [phone] 08/01/2015, 9:56 AM

## 2015-08-01 NOTE — Progress Notes (Signed)
Patient here for follow up Complains of having generalized pain all over

## 2015-08-01 NOTE — Telephone Encounter (Signed)
Patient picked paperwork.

## 2015-08-05 MED FILL — LOSARTAN POTASSIUM 100 MG T: 100 | 30 days supply | Qty: 30 | Fill #7

## 2015-08-05 MED FILL — ?OMEPRAZOLE DR 20 MG CAPSUL: 20 | 30 days supply | Qty: 60 | Fill #1

## 2015-09-05 ENCOUNTER — Other Ambulatory Visit: Payer: Self-pay | Admitting: Internal Medicine

## 2015-09-05 MED FILL — ?OMEPRAZOLE DR 20 MG CAPSUL: 20 | 30 days supply | Qty: 60 | Fill #2

## 2015-09-05 MED FILL — LOSARTAN POTASSIUM 100 MG T: 100 | 30 days supply | Qty: 30 | Fill #8

## 2015-09-06 ENCOUNTER — Other Ambulatory Visit: Payer: Self-pay

## 2015-09-06 DIAGNOSIS — I10 Essential (primary) hypertension: Secondary | ICD-10-CM

## 2015-09-06 MED ORDER — LOSARTAN POTASSIUM 100 MG PO TABS: 100.0000 mg | ORAL_TABLET | Freq: Every day | ORAL | Status: DC

## 2015-09-06 NOTE — Telephone Encounter (Signed)
Pt. Called requesting a medication refill on the following medications:  traMADol (ULTRAM) 50 MG tablet losartan (COZAAR) 100 MG tablet  Please f/u with pt.

## 2015-09-07 MED FILL — traMADol HCL 50 MG TABS: 50 | 30 days supply | Qty: 90 | Fill #0

## 2015-10-04 ENCOUNTER — Telehealth: Payer: Self-pay

## 2015-10-04 ENCOUNTER — Other Ambulatory Visit: Payer: Self-pay | Admitting: Internal Medicine

## 2015-10-04 MED FILL — ?OMEPRAZOLE DR 20 MG CAPSUL: 20 | 30 days supply | Qty: 60 | Fill #0

## 2015-10-04 NOTE — Telephone Encounter (Signed)
Patient needs medication refill for Tramadol, omprazole....please follow up

## 2015-10-04 NOTE — Telephone Encounter (Signed)
CMA called patient, patient verified name and DOB. Patient was told that her Rx for Tramadol is available for pick-up at the front desk. Patient verbalized she understood with no further questions.

## 2015-10-05 ENCOUNTER — Telehealth: Payer: Self-pay | Admitting: Internal Medicine

## 2015-10-05 MED FILL — LOSARTAN POTASSIUM 100 MG T: 100 | 30 days supply | Qty: 30 | Fill #0

## 2015-10-05 MED FILL — traMADol HCL 50 MG TABS: 50 | 30 days supply | Qty: 90 | Fill #0

## 2015-10-05 NOTE — Telephone Encounter (Signed)
Patient would like dental referral. Please follow up.

## 2015-11-02 ENCOUNTER — Telehealth: Payer: Self-pay | Admitting: Internal Medicine

## 2015-11-02 ENCOUNTER — Other Ambulatory Visit: Payer: Self-pay | Admitting: Internal Medicine

## 2015-11-02 NOTE — Telephone Encounter (Signed)
Patient is requesting for tramadol....please follow up

## 2015-11-04 NOTE — Telephone Encounter (Signed)
Pt. Called requesting a refill on Tramadol. Please f/u °

## 2015-11-07 NOTE — Telephone Encounter (Signed)
Pt. Called requesting a refill on the following medication:  Tramadol   Please f/u

## 2015-11-14 MED FILL — traMADol HCL 50 MG TABS: 50 | 30 days supply | Qty: 90 | Fill #0

## 2015-11-14 MED FILL — LOSARTAN POTASSIUM 100 MG T: 100 | 30 days supply | Qty: 30 | Fill #1

## 2015-11-14 MED FILL — OMEPRAZOLE DR 20 MG CAPSULE: 20 | 30 days supply | Qty: 60 | Fill #1

## 2015-11-22 NOTE — Telephone Encounter (Signed)
Request refilled on 11/07/15

## 2015-12-06 ENCOUNTER — Telehealth: Payer: Self-pay | Admitting: Internal Medicine

## 2015-12-06 NOTE — Telephone Encounter (Signed)
Patient is needing tramadol. °Please follow up. °

## 2015-12-07 ENCOUNTER — Other Ambulatory Visit: Payer: Self-pay | Admitting: Internal Medicine

## 2015-12-07 MED FILL — OMEPRAZOLE DR 20 MG CAPSULE: 20 | 30 days supply | Qty: 60 | Fill #2

## 2015-12-07 MED FILL — LOSARTAN POTASSIUM 100 MG T: 100 | 30 days supply | Qty: 30 | Fill #2

## 2015-12-07 NOTE — Telephone Encounter (Signed)
Patient is requesting tramadol. °Please follow up. ° °

## 2015-12-09 NOTE — Telephone Encounter (Signed)
Called patient to inform that Rx for Tramadol is ready for pick up. LVM

## 2015-12-09 NOTE — Telephone Encounter (Signed)
Error

## 2015-12-12 MED FILL — traMADol HCL 50 MG TABS: 50 | 30 days supply | Qty: 90 | Fill #0

## 2015-12-13 NOTE — Telephone Encounter (Signed)
Patients request was filled on 12/09/15 and placed at the front desk for pickup.

## 2016-01-11 ENCOUNTER — Encounter: Payer: Self-pay | Admitting: Family Medicine

## 2016-01-11 ENCOUNTER — Ambulatory Visit: Payer: Self-pay | Attending: Family Medicine | Admitting: Family Medicine

## 2016-01-11 VITALS — BP 148/88 | HR 78 | Temp 97.8°F | Ht 64.0 in | Wt 171.2 lb

## 2016-01-11 DIAGNOSIS — R42 Dizziness and giddiness: Secondary | ICD-10-CM | POA: Insufficient documentation

## 2016-01-11 DIAGNOSIS — D571 Sickle-cell disease without crisis: Secondary | ICD-10-CM | POA: Insufficient documentation

## 2016-01-11 DIAGNOSIS — Z7982 Long term (current) use of aspirin: Secondary | ICD-10-CM | POA: Insufficient documentation

## 2016-01-11 DIAGNOSIS — D5700 Hb-SS disease with crisis, unspecified: Secondary | ICD-10-CM

## 2016-01-11 DIAGNOSIS — I10 Essential (primary) hypertension: Secondary | ICD-10-CM | POA: Insufficient documentation

## 2016-01-11 DIAGNOSIS — Z79899 Other long term (current) drug therapy: Secondary | ICD-10-CM | POA: Insufficient documentation

## 2016-01-11 DIAGNOSIS — K009 Disorder of tooth development, unspecified: Secondary | ICD-10-CM

## 2016-01-11 DIAGNOSIS — G8929 Other chronic pain: Secondary | ICD-10-CM | POA: Insufficient documentation

## 2016-01-11 LAB — COMPLETE METABOLIC PANEL WITH GFR
ALBUMIN: 4.4 g/dL (ref 3.6–5.1)
ALK PHOS: 90 U/L (ref 33–130)
ALT: 12 U/L (ref 6–29)
AST: 15 U/L (ref 10–35)
BUN: 9 mg/dL (ref 7–25)
CALCIUM: 8.8 mg/dL (ref 8.6–10.4)
CO2: 25 mmol/L (ref 20–31)
CREATININE: 0.73 mg/dL (ref 0.50–1.05)
Chloride: 106 mmol/L (ref 98–110)
GFR, Est African American: >89 mL/min (ref >=60)
GFR, Est Non African American: >89 mL/min (ref >=60)
Glucose, Bld: 70 mg/dL (ref 65–99)
Potassium: 4.2 mmol/L (ref 3.5–5.3)
Sodium: 139 mmol/L (ref 135–146)
Total Bilirubin: 1.1 mg/dL (ref 0.2–1.2)
Total Protein: 7.4 g/dL (ref 6.1–8.1)

## 2016-01-11 LAB — LIPID PANEL
CHOLESTEROL: 152 mg/dL (ref 125–200)
HDL: 55 mg/dL (ref >=46)
LDL Cholesterol: 87 mg/dL (ref <130)
TRIGLYCERIDES: 48 mg/dL (ref <150)
Total CHOL/HDL Ratio: 2.8 Ratio (ref <=5.0)
VLDL: 10 mg/dL (ref <30)

## 2016-01-11 LAB — CBC WITH DIFFERENTIAL/PLATELET
Basophils Absolute: 43 cells/uL (ref 0–200)
Basophils Relative: 1 %
Eosinophils Absolute: 43 cells/uL (ref 15–500)
Eosinophils Relative: 1 %
HEMATOCRIT: 32.5 % — AB (ref 35.0–45.0)
HEMOGLOBIN: 10.9 g/dL — AB (ref 11.7–15.5)
LYMPHS ABS: 1290 {cells}/uL (ref 850–3900)
Lymphocytes Relative: 30 %
MCH: 24.6 pg — ABNORMAL LOW (ref 27.0–33.0)
MCHC: 33.5 g/dL (ref 32.0–36.0)
MCV: 73.4 fL — AB (ref 80.0–100.0)
MONO ABS: 301 {cells}/uL (ref 200–950)
MPV: 9.9 fL (ref 7.5–12.5)
Monocytes Relative: 7 %
NEUTROS PCT: 61 %
Neutro Abs: 2623 cells/uL (ref 1500–7800)
Platelets: 163 10*3/uL (ref 140–400)
RBC: 4.43 MIL/uL (ref 3.80–5.10)
RDW: 18.6 % — ABNORMAL HIGH (ref 11.0–15.0)
WBC: 4.3 10*3/uL (ref 3.8–10.8)

## 2016-01-11 MED ORDER — LOSARTAN POTASSIUM 100 MG PO TABS: 100.0000 mg | ORAL_TABLET | Freq: Every day | ORAL | Status: DC

## 2016-01-11 MED ORDER — OMEPRAZOLE 20 MG PO CPDR
40.0000 mg | DELAYED_RELEASE_CAPSULE | Freq: Every day | ORAL | Status: DC
Start: 2016-01-11 — End: 2016-03-13

## 2016-01-11 MED ORDER — AMLODIPINE BESYLATE 5 MG PO TABS
5.0000 mg | ORAL_TABLET | Freq: Every day | ORAL | Status: DC
Start: 2016-01-11 — End: 2016-03-13

## 2016-01-11 MED ORDER — TRAMADOL HCL 50 MG PO TABS: 50.0000 mg | ORAL_TABLET | Freq: Three times a day (TID) | ORAL | Status: DC

## 2016-01-11 MED FILL — OMEPRAZOLE DR 20 MG CAPSULE: 20 | 30 days supply | Qty: 60 | Fill #3

## 2016-01-11 MED FILL — LOSARTAN POTASSIUM 100 MG T: 100 | 30 days supply | Qty: 30 | Fill #3

## 2016-01-11 MED FILL — traMADol HCL 50 MG TABS: 50 | 30 days supply | Qty: 90 | Fill #0

## 2016-01-11 MED FILL — AMLODIPINE BESYLATE 5 MG TA: 5 | 30 days supply | Qty: 30 | Fill #0

## 2016-01-11 NOTE — Patient Instructions (Signed)
Sickle Cell Anemia, Adult Sickle cell anemia is a condition in which red blood cells have an abnormal "sickle" shape. This abnormal shape shortens the cells' life span, which results in a lower than normal concentration of red blood cells in the blood. The sickle shape also causes the cells to clump together and block free blood flow through the blood vessels. As a result, the tissues and organs of the body do not receive enough oxygen. Sickle cell anemia causes organ damage and pain and increases the risk of infection. CAUSES  Sickle cell anemia is a genetic disorder. Those who receive two copies of the gene have the condition, and those who receive one copy have the trait. RISK FACTORS The sickle cell gene is most common in people whose families originated in [place]. Other areas of the globe where sickle cell trait occurs include the [place], [place] and [place], the [place], and the [place].  SIGNS AND SYMPTOMS  Pain, especially in the extremities, back, chest, or abdomen (common). The pain may start suddenly or may develop following an illness, especially if there is dehydration. Pain can also occur due to overexertion or exposure to extreme temperature changes.  Frequent severe bacterial infections, especially certain types of pneumonia and meningitis.  Pain and swelling in the hands and feet.  Decreased activity.   Loss of appetite.   Change in behavior.  Headaches.  Seizures.  Shortness of breath or difficulty breathing.  Vision changes.  Skin ulcers. Those with the trait may not have symptoms or they may have mild symptoms.  DIAGNOSIS  Sickle cell anemia is diagnosed with blood tests that demonstrate the genetic trait. It is often diagnosed during the newborn period, due to mandatory testing nationwide. A variety of blood tests, X-rays, CT scans, MRI scans, ultrasounds, and lung function tests may also be done to monitor the condition. TREATMENT  Sickle  cell anemia may be treated with:  Medicines. You may be given pain medicines, antibiotic medicines (to treat and prevent infections) or medicines to increase the production of certain types of hemoglobin.  Fluids.  Oxygen.  Blood transfusions. HOME CARE INSTRUCTIONS   Drink enough fluid to keep your urine clear or pale yellow. Increase your fluid intake in hot weather and during exercise.  Do not smoke. Smoking lowers oxygen levels in the blood.   Only take over-the-counter or prescription medicines for pain, fever, or discomfort as directed by your health care provider.  Take antibiotics as directed by your health care provider. Make sure you finish them it even if you start to feel better.   Take supplements as directed by your health care provider.   Consider wearing a medical alert bracelet. This tells anyone caring for you in an emergency of your condition.   When traveling, keep your medical information, health care provider's names, and the medicines you take with you at all times.   If you develop a fever, do not take medicines to reduce the fever right away. This could cover up a problem that is developing. Notify your health care provider.  Keep all follow-up appointments with your health care provider. Sickle cell anemia requires regular medical care. SEEK MEDICAL CARE IF: You have a fever. SEEK IMMEDIATE MEDICAL CARE IF:   You feel dizzy or faint.   You have new abdominal pain, especially on the left side near the stomach area.   You develop a persistent, often uncomfortable and painful penile erection (priapism). If this is not treated immediately it   will lead to impotence.   You have numbness your arms or legs or you have a hard time moving them.   You have a hard time with speech.   You have a fever or persistent symptoms for more than 2-3 days.   You have a fever and your symptoms suddenly get worse.   You have signs or symptoms of infection.  These include:   Chills.   Abnormal tiredness (lethargy).   Irritability.   Poor eating.   Vomiting.   You develop pain that is not helped with medicine.   You develop shortness of breath.  You have pain in your chest.   You are coughing up pus-like or bloody sputum.   You develop a stiff neck.  Your feet or hands swell or have pain.  Your abdomen appears bloated.  You develop joint pain. MAKE SURE YOU:  Understand these instructions.   This information is not intended to replace advice given to you by your health care provider. Make sure you discuss any questions you have with your health care provider.   Document Released: 09/26/2005 Document Revised: 07/09/2014 Document Reviewed: 01/28/2013 Elsevier Interactive Patient Education 2016 [employer]  

## 2016-01-11 NOTE — Progress Notes (Signed)
Subjective:  Patient ID: Kayla Woodard, female    DOB: 1956/05/20  Age: 60 y.o. MRN: [id number]  CC: Establish Care   HPI Kayla Woodard is a 60 year old female with a history of sickle cell anemia, hypertension who comes into the clinic to establish care with me as she was previously followed by the nurse practitioner who is no longer with the clinic. She informs me that she is unable to follow-up with the sickle cell clinic due to immigration issues but remains on tramadol and is in touch with the sickle cell agency [facility].  Her blood pressure is slightly elevated and she has been compliant with her antihypertensives.  Complains of occasional dizziness for the last 1 month on standing from a sitting position but denies shortness of breath, tinnitus, nausea or vomiting. Would also like to be referred to the Dentist for tooth extraction.  Outpatient Prescriptions Prior to Visit  Medication Sig Dispense Refill  . aspirin EC 81 MG tablet Take 81 mg by mouth daily.    . Multiple Vitamin (MULTIVITAMIN WITH MINERALS) TABS tablet Take 1 tablet by mouth daily.    Marland Kitchen. losartan (COZAAR) 100 MG tablet Take 1 tablet (100 mg total) by mouth daily. 90 tablet 2  . omeprazole (PRILOSEC) 20 MG capsule TAKE 2 CAPSULES BY MOUTH DAILY 60 capsule 2  . traMADol (ULTRAM) 50 MG tablet TAKE 1 TABLET BY MOUTH 3 TIMES DAILY 90 tablet 0  . gabapentin (NEURONTIN) 100 MG capsule TAKE 1 CAPSULE BY MOUTH 3 TIMES DAILY. (Patient not taking: Reported on 07/19/2015) 90 capsule 3   No facility-administered medications prior to visit.    ROS Review of Systems  Constitutional: Negative for activity change, appetite change and fatigue.  HENT: Positive for dental problem. Negative for congestion, sinus pressure and sore throat.   Eyes: Negative for visual disturbance.  Respiratory: Negative for cough, chest tightness, shortness of breath and wheezing.   Cardiovascular: Negative for chest  pain and palpitations.  Gastrointestinal: Negative for abdominal pain, constipation and abdominal distention.  Endocrine: Negative for polydipsia.  Genitourinary: Negative for dysuria and frequency.  Musculoskeletal: Positive for arthralgias. Negative for back pain.  Skin: Negative for rash.  Neurological: Positive for light-headedness. Negative for tremors and numbness.  Hematological: Does not bruise/bleed easily.  Psychiatric/Behavioral: Negative for behavioral problems and agitation.    Objective:  BP 148/88 mmHg  Pulse 78  Temp(Src) 97.8 F (36.6 C) (Oral)  Ht 5\' 4"  (1.626 m)  Wt 171 lb 3.2 oz (77.656 kg)  BMI 29.37 kg/m2  SpO2 99%  BP/Weight 01/11/2016 08/01/2015 07/19/2015  Systolic BP 148 151 148  Diastolic BP 88 91 106  Wt. (Lbs) 171.2 175 168  BMI 29.37 30.02 29.77      Physical Exam  Constitutional: She is oriented to person, place, and time. She appears well-developed and well-nourished.  Cardiovascular: Normal rate, normal heart sounds and intact distal pulses.   No murmur heard. Pulmonary/Chest: Effort normal and breath sounds normal. She has no wheezes. She has no rales. She exhibits no tenderness.  Abdominal: Soft. Bowel sounds are normal. She exhibits no distension and no mass. There is no tenderness.  Musculoskeletal: Normal range of motion.  Neurological: She is alert and oriented to person, place, and time.     Assessment & Plan:   1. Essential hypertension Blood pressure is slightly above goal. Amlodipine added to regimen - losartan (COZAAR) 100 MG tablet; Take 1 tablet (100 mg total)  by mouth daily.  Dispense: 90 tablet; Refill: 3 - Lipid panel - COMPLETE METABOLIC PANEL WITH GFR  2. Hb-SS disease  (HCC) No acute crisis at this time Patient advised that she will need to follow-up with the sickle cell clinic Tramadol prescribed for pain - CBC with Differential/Platelet  3. Chronic pain Continue tramadol  4. Dizziness and giddiness Need to  exclude anemia as possible etiology  Healthcare maintenance-refuses Pap smear, mammogram or colonoscopy despite my discussing with her the risk of cancer. Meds ordered this encounter  Medications  . traMADol (ULTRAM) 50 MG tablet    Sig: Take 1 tablet (50 mg total) by mouth 3 (three) times daily.    Dispense:  90 tablet    Refill:  2  . losartan (COZAAR) 100 MG tablet    Sig: Take 1 tablet (100 mg total) by mouth daily.    Dispense:  90 tablet    Refill:  3  . omeprazole (PRILOSEC) 20 MG capsule    Sig: Take 2 capsules (40 mg total) by mouth daily.    Dispense:  60 capsule    Refill:  3  . amLODipine (NORVASC) 5 MG tablet    Sig: Take 1 tablet (5 mg total) by mouth daily.    Dispense:  30 tablet    Refill:  3    Follow-up: Return in about 3 months (around 04/12/2016) for Follow-up of hypertension.   Jaclyn Shaggy MD

## 2016-01-12 ENCOUNTER — Telehealth: Payer: Self-pay

## 2016-01-12 NOTE — Telephone Encounter (Signed)
-----   Message from Jaclyn ShaggyEnobong Amao, MD sent at 01/12/2016 12:50 PM EDT ----- Cholesterol, liver and kidney function are normal. She is still anemic at her baseline.

## 2016-01-12 NOTE — Telephone Encounter (Signed)
Called patient. Gave lab results. Patient verbalized understanding.  

## 2016-01-23 ENCOUNTER — Ambulatory Visit: Payer: Self-pay | Admitting: Family Medicine

## 2016-02-07 ENCOUNTER — Telehealth: Payer: Self-pay | Admitting: Family Medicine

## 2016-02-07 NOTE — Telephone Encounter (Signed)
Pt called requesting medication refill on traMADol (ULTRAM) 50 MG tablet, please f/up ° °

## 2016-02-08 NOTE — Telephone Encounter (Signed)
Contacted pt and made aware she had a #90 with 2 refills she will need to call the pharmacy to get rx refilled

## 2016-02-08 NOTE — Telephone Encounter (Signed)
At her office visit on 01/11/16, she received a prescription for tramadol #90 with 2 refills

## 2016-02-10 ENCOUNTER — Other Ambulatory Visit: Payer: Self-pay | Admitting: Family Medicine

## 2016-02-10 MED FILL — ?OMEPRAZOLE DR 20 MG CAPSUL: 20 | 30 days supply | Qty: 60 | Fill #0

## 2016-02-10 MED FILL — AMLODIPINE BESYLATE 5 MG TA: 5 | 30 days supply | Qty: 30 | Fill #1

## 2016-02-10 MED FILL — LOSARTAN POTASSIUM 100 MG T: 100 | 30 days supply | Qty: 30 | Fill #4

## 2016-02-13 ENCOUNTER — Telehealth: Payer: Self-pay | Admitting: Family Medicine

## 2016-02-13 ENCOUNTER — Ambulatory Visit (HOSPITAL_COMMUNITY)
Admission: EM | Admit: 2016-02-13 | Discharge: 2016-02-13 | Disposition: A | Discharge status: Home / Self Care | Payer: Self-pay | Attending: Family Medicine | Admitting: Family Medicine

## 2016-02-13 ENCOUNTER — Encounter (HOSPITAL_COMMUNITY): Payer: Self-pay | Admitting: Emergency Medicine

## 2016-02-13 DIAGNOSIS — D5700 Hb-SS disease with crisis, unspecified: Secondary | ICD-10-CM

## 2016-02-13 DIAGNOSIS — M25559 Pain in unspecified hip: Secondary | ICD-10-CM

## 2016-02-13 DIAGNOSIS — M25569 Pain in unspecified knee: Secondary | ICD-10-CM

## 2016-02-13 LAB — POCT URINALYSIS DIP (DEVICE)
Bilirubin Urine: NEGATIVE
Glucose, UA: NEGATIVE mg/dL
Hgb urine dipstick: NEGATIVE
Ketones, ur: NEGATIVE mg/dL
Leukocytes, UA: NEGATIVE
NITRITE: NEGATIVE
PROTEIN: NEGATIVE mg/dL
SPECIFIC GRAVITY, URINE: 1.015 (ref 1.005–1.030)
UROBILINOGEN UA: 4 mg/dL — AB (ref 0.0–1.0)
pH: 8.5 — ABNORMAL HIGH (ref 5.0–8.0)

## 2016-02-13 LAB — POCT I-STAT, CHEM 8
BUN: 7 mg/dL (ref 6–20)
CALCIUM ION: 1.21 mmol/L (ref 1.13–1.30)
CHLORIDE: 103 mmol/L (ref 101–111)
Creatinine, Ser: 0.8 mg/dL (ref 0.44–1.00)
GLUCOSE: 96 mg/dL (ref 65–99)
HCT: 30 % — ABNORMAL LOW (ref 36.0–46.0)
Hemoglobin: 10.2 g/dL — ABNORMAL LOW (ref 12.0–15.0)
Potassium: 4 mmol/L (ref 3.5–5.1)
Sodium: 142 mmol/L (ref 135–145)
TCO2: 26 mmol/L (ref 0–100)

## 2016-02-13 MED ORDER — KETOROLAC TROMETHAMINE 60 MG/2ML IM SOLN
INTRAMUSCULAR | Status: AC
  Filled 2016-02-13: qty 2

## 2016-02-13 MED ORDER — KETOROLAC TROMETHAMINE 60 MG/2ML IM SOLN
60.0000 mg | Freq: Once | INTRAMUSCULAR | Status: AC
  Administered 2016-02-13: 60 mg via INTRAMUSCULAR

## 2016-02-13 MED FILL — AMOXICILLIN 500 MG CAPSULE: 500 | 7 days supply | Qty: 21 | Fill #0

## 2016-02-13 MED FILL — traMADol HCL 50 MG TABS: 50 | 30 days supply | Qty: 90 | Fill #0

## 2016-02-13 NOTE — Telephone Encounter (Signed)
Patient is needing tramadol °

## 2016-02-13 NOTE — Telephone Encounter (Signed)
Patient contacted and stated understanding.

## 2016-02-13 NOTE — Telephone Encounter (Signed)
I gave her #90 Tramadol with two refills at her visit on 12/2015

## 2016-02-13 NOTE — ED Provider Notes (Signed)
CSN: [id number]     Arrival date & time 02/13/16  1032 History   None    Chief Complaint  Patient presents with  . Pain   (Consider location/radiation/quality/duration/timing/severity/associated sxs/prior Treatment) Patient c/o generalized arthralgias.  She takes Tramadol for pain and currently has rx and  She denies having sickle cell crises.     The history is provided by the patient.  Back Pain  Location:  Generalized Quality:  Aching Radiates to:  Does not radiate Pain is:  Worse during the day Onset quality:  Sudden Timing:  Constant Progression:  Waxing and waning Chronicity:  New Relieved by:  Nothing Worsened by:  Nothing   Past Medical History:  Diagnosis Date  . Anemia   . Back pain, chronic   . Hypertension   . Sickle cell anemia (HCC)    History reviewed. No pertinent surgical history. Family History  Problem Relation Age of Onset  . Hypertension Mother   . Sickle cell anemia Father    Social History  Substance Use Topics  . Smoking status: Never Smoker  . Smokeless tobacco: Not on file  . Alcohol use No   OB History    No data available     Review of Systems  Constitutional: Negative.   HENT: Negative.   Eyes: Negative.   Respiratory: Negative.   Cardiovascular: Negative.   Gastrointestinal: Negative.   Endocrine: Negative.   Genitourinary: Negative.   Musculoskeletal: Positive for back pain.  Skin: Negative.   Allergic/Immunologic: Negative.   Neurological: Negative.   Hematological: Negative.   Psychiatric/Behavioral: Negative.     Allergies  Oxycodone and Vicodin [hydrocodone-acetaminophen]  Home Medications   Prior to Admission medications   Medication Sig Start Date End Date Taking? Authorizing Provider  amLODipine (NORVASC) 5 MG tablet Take 1 tablet (5 mg total) by mouth daily. 01/11/16   Jaclyn ShaggyEnobong Amao, MD  aspirin EC 81 MG tablet Take 81 mg by mouth daily.    Historical Provider, MD  losartan (COZAAR) 100 MG tablet Take 1  tablet (100 mg total) by mouth daily. 01/11/16   Jaclyn ShaggyEnobong Amao, MD  Multiple Vitamin (MULTIVITAMIN WITH MINERALS) TABS tablet Take 1 tablet by mouth daily.    Historical Provider, MD  omeprazole (PRILOSEC) 20 MG capsule Take 2 capsules (40 mg total) by mouth daily. 01/11/16   Jaclyn ShaggyEnobong Amao, MD  traMADol (ULTRAM) 50 MG tablet TAKE 1 TABLET BY MOUTH 3 TIMES A DAY 02/13/16   Jaclyn ShaggyEnobong Amao, MD   Meds Ordered and Administered this Visit   Medications  ketorolac (TORADOL) injection 60 mg (not administered)    BP 135/81 (BP Location: Right Arm)   Pulse 71   Temp 98.3 F (36.8 C) (Oral)   Resp 15   SpO2 100%  No data found.   Physical Exam  Constitutional: She is oriented to person, place, and time. She appears well-developed and well-nourished.  HENT:  Head: Normocephalic and atraumatic.  Right Ear: External ear normal.  Left Ear: External ear normal.  Mouth/Throat: Oropharynx is clear and moist.  Eyes: Conjunctivae and EOM are normal. Pupils are equal, round, and reactive to light.  Neck: Normal range of motion. Neck supple.  Cardiovascular: Normal rate, regular rhythm and normal heart sounds.   Pulmonary/Chest: Effort normal.  Abdominal: Soft. Bowel sounds are normal.  Musculoskeletal: Normal range of motion.  Neurological: She is alert and oriented to person, place, and time.  Nursing note and vitals reviewed.   Urgent Care Course   Clinical Course  Procedures (including critical care time)  Labs Review Labs Reviewed  POCT I-STAT, CHEM 8 - Abnormal; Notable for the following:       Result Value   Hemoglobin 10.2 (*)    HCT 30.0 (*)    All other components within normal limits  POCT URINALYSIS DIP (DEVICE) - Abnormal; Notable for the following:    pH 8.5 (*)    Urobilinogen, UA 4.0 (*)    All other components within normal limits    Imaging Review No results found.   Visual Acuity Review  Right Eye Distance:   Left Eye Distance:   Bilateral Distance:    Right  Eye Near:   Left Eye Near:    Bilateral Near:         MDM   1. Chronic arthralgias of knees and hips, unspecified laterality   2. Hb-SS disease with crisis [facility](HCC)   Exam is normal w/o focal joint pain.  C/o more subjective. Discussed with patient UA and chem 8 wnl.  Discussed with patient that this may Be the Sickle Cell and cannot r/o crises but she doesn't feel like this is the problem and does not want to go to the ED.  Tramadol 60mg  IM and informed if not better then go to ED.  Continue Tramadol for chronic HB SS Pain      Deatra CanterWilliam J Bassel Gaskill, FNP 02/13/16 1148

## 2016-02-13 NOTE — ED Triage Notes (Signed)
Patient reports "burning" all over.  Reports medicine [place] helped.  Out of this medicine and does not know name of this mendicine

## 2016-03-13 ENCOUNTER — Telehealth: Payer: Self-pay | Admitting: Family Medicine

## 2016-03-13 DIAGNOSIS — I10 Essential (primary) hypertension: Secondary | ICD-10-CM

## 2016-03-13 MED ORDER — TRAMADOL HCL 50 MG PO TABS: 50.0000 mg | ORAL_TABLET | Freq: Three times a day (TID) | ORAL | 0 refills | Status: DC

## 2016-03-13 MED ORDER — LOSARTAN POTASSIUM 100 MG PO TABS: 100.0000 mg | ORAL_TABLET | Freq: Every day | ORAL | 3 refills | Status: DC

## 2016-03-13 MED ORDER — OMEPRAZOLE 20 MG PO CPDR: 40.0000 mg | DELAYED_RELEASE_CAPSULE | Freq: Every day | ORAL | 3 refills | Status: DC

## 2016-03-13 MED ORDER — AMLODIPINE BESYLATE 5 MG PO TABS: 5.0000 mg | ORAL_TABLET | Freq: Every day | ORAL | 3 refills | Status: DC

## 2016-03-13 MED FILL — ?OMEPRAZOLE DR 20 MG CAPSUL: 20 | 30 days supply | Qty: 60 | Fill #0

## 2016-03-13 MED FILL — AMLODIPINE BESYLATE 5 MG TA: 5 | 30 days supply | Qty: 30 | Fill #0

## 2016-03-13 MED FILL — ?LOSARTAN POTASSIUM 100 MG: 100 | 30 days supply | Qty: 30 | Fill #0

## 2016-03-13 NOTE — Telephone Encounter (Signed)
Tramadol ready for pick up Please inform patient  

## 2016-03-13 NOTE — Telephone Encounter (Signed)
Patient called requesting a medication refill for Tramadol,  Losartan,omeprozale, amlodipine

## 2016-03-13 NOTE — Telephone Encounter (Signed)
Refilled requested medications except for tramadol - that needs physician approval. Will forward request to Dr. Armen PickupFunches who is covering for Dr. Venetia NightAmao.

## 2016-03-14 MED FILL — traMADol HCL 50 MG TABS: 50 | 30 days supply | Qty: 90 | Fill #0

## 2016-03-14 NOTE — Telephone Encounter (Signed)
Pt was called on 9/12 and informed of script being ready for pick up.

## 2016-04-09 ENCOUNTER — Telehealth: Payer: Self-pay | Admitting: Family Medicine

## 2016-04-09 DIAGNOSIS — I10 Essential (primary) hypertension: Secondary | ICD-10-CM

## 2016-04-09 MED ORDER — LOSARTAN POTASSIUM 100 MG PO TABS: 100.0000 mg | ORAL_TABLET | Freq: Every day | ORAL | 3 refills | Status: DC

## 2016-04-09 MED ORDER — OMEPRAZOLE 20 MG PO CPDR: 40.0000 mg | DELAYED_RELEASE_CAPSULE | Freq: Every day | ORAL | 3 refills | Status: DC

## 2016-04-09 MED ORDER — AMLODIPINE BESYLATE 5 MG PO TABS
5.0000 mg | ORAL_TABLET | Freq: Every day | ORAL | 3 refills | Status: DC
Start: 2016-04-09 — End: 2016-08-27

## 2016-04-09 MED FILL — AMLODIPINE BESYLATE 5 MG TA: 5 | 30 days supply | Qty: 30 | Fill #0

## 2016-04-09 MED FILL — ?LOSARTAN POTASSIUM 100 MG: 100 | 30 days supply | Qty: 30 | Fill #0

## 2016-04-09 MED FILL — ?OMEPRAZOLE DR 20 MG CAPSUL: 20 | 30 days supply | Qty: 60 | Fill #0

## 2016-04-09 NOTE — Telephone Encounter (Signed)
Requested medications refilled 

## 2016-04-09 NOTE — Telephone Encounter (Signed)
Patient called the office to request refills for amLODipine (NORVASC) 5 MG tablet, losartan (COZAAR) 100 MG tablet and omeprazole (PRILOSEC) 20 MG capsule.   Thank you

## 2016-04-09 NOTE — Telephone Encounter (Signed)
Patient called to request medication refill for traMADol (ULTRAM) 50 MG tablet.  ° °Thank you.  °

## 2016-04-10 MED ORDER — TRAMADOL HCL 50 MG PO TABS: 50.0000 mg | ORAL_TABLET | Freq: Three times a day (TID) | ORAL | 0 refills | Status: DC

## 2016-04-10 NOTE — Telephone Encounter (Signed)
Done

## 2016-04-11 ENCOUNTER — Telehealth: Payer: Self-pay

## 2016-04-11 NOTE — Telephone Encounter (Signed)
Writer called patient regarding medication refill.  Tramadol prescription is ready for pick up.  Patient stated understanding.

## 2016-04-12 MED FILL — traMADol HCL 50 MG TABS: 50 | 30 days supply | Qty: 90 | Fill #0

## 2016-04-13 ENCOUNTER — Emergency Department (HOSPITAL_COMMUNITY)
Admission: EM | Admit: 2016-04-13 | Discharge: 2016-04-13 | Disposition: A | Discharge status: Home / Self Care | Payer: Self-pay | Attending: Emergency Medicine | Admitting: Emergency Medicine

## 2016-04-13 ENCOUNTER — Encounter (HOSPITAL_COMMUNITY): Payer: Self-pay | Admitting: *Deleted

## 2016-04-13 DIAGNOSIS — D571 Sickle-cell disease without crisis: Secondary | ICD-10-CM | POA: Insufficient documentation

## 2016-04-13 DIAGNOSIS — H9313 Tinnitus, bilateral: Secondary | ICD-10-CM | POA: Insufficient documentation

## 2016-04-13 DIAGNOSIS — Z7982 Long term (current) use of aspirin: Secondary | ICD-10-CM | POA: Insufficient documentation

## 2016-04-13 DIAGNOSIS — R42 Dizziness and giddiness: Secondary | ICD-10-CM

## 2016-04-13 DIAGNOSIS — I10 Essential (primary) hypertension: Secondary | ICD-10-CM | POA: Insufficient documentation

## 2016-04-13 LAB — CBC WITH DIFFERENTIAL/PLATELET
Basophils Absolute: 0 10*3/uL (ref 0.0–0.1)
Basophils Relative: 0 %
EOS ABS: 0.1 10*3/uL (ref 0.0–0.7)
EOS PCT: 2 %
HCT: 28.2 % — ABNORMAL LOW (ref 36.0–46.0)
Hemoglobin: 10.1 g/dL — ABNORMAL LOW (ref 12.0–15.0)
LYMPHS ABS: 1.3 10*3/uL (ref 0.7–4.0)
Lymphocytes Relative: 32 %
MCH: 25.1 pg — AB (ref 26.0–34.0)
MCHC: 35.8 g/dL (ref 30.0–36.0)
MCV: 70 fL — ABNORMAL LOW (ref 78.0–100.0)
MONO ABS: 0.2 10*3/uL (ref 0.1–1.0)
MONOS PCT: 5 %
Neutro Abs: 2.4 10*3/uL (ref 1.7–7.7)
Neutrophils Relative %: 61 %
PLATELETS: 157 10*3/uL (ref 150–400)
RBC: 4.03 MIL/uL (ref 3.87–5.11)
RDW: 17.4 % — ABNORMAL HIGH (ref 11.5–15.5)
WBC: 3.9 10*3/uL — ABNORMAL LOW (ref 4.0–10.5)

## 2016-04-13 LAB — BASIC METABOLIC PANEL
Anion gap: 6 (ref 5–15)
BUN: 5 mg/dL — AB (ref 6–20)
CO2: 27 mmol/L (ref 22–32)
CREATININE: 0.72 mg/dL (ref 0.44–1.00)
Calcium: 9.2 mg/dL (ref 8.9–10.3)
Chloride: 106 mmol/L (ref 101–111)
GFR calc Af Amer: >60 mL/min (ref >60)
Glucose, Bld: 93 mg/dL (ref 65–99)
Potassium: 3.8 mmol/L (ref 3.5–5.1)
Sodium: 139 mmol/L (ref 135–145)

## 2016-04-13 MED ORDER — MECLIZINE HCL 25 MG PO TABS: 25.0000 mg | ORAL_TABLET | Freq: Three times a day (TID) | ORAL | 0 refills | Status: DC | PRN

## 2016-04-13 MED ORDER — MECLIZINE HCL 25 MG PO TABS
25.0000 mg | ORAL_TABLET | Freq: Once | ORAL | Status: AC
  Administered 2016-04-13: 25 mg via ORAL
  Filled 2016-04-13: qty 1

## 2016-04-13 MED ORDER — ONDANSETRON HCL 4 MG/2ML IJ SOLN
4.0000 mg | Freq: Once | INTRAMUSCULAR | Status: AC
Start: 2016-04-13 — End: 2016-04-13
  Administered 2016-04-13: 4 mg via INTRAVENOUS
  Filled 2016-04-13: qty 2

## 2016-04-13 MED ORDER — SODIUM CHLORIDE 0.9 % IV BOLUS (SEPSIS)
1000.0000 mL | Freq: Once | INTRAVENOUS | Status: AC
  Administered 2016-04-13: 1000 mL via INTRAVENOUS

## 2016-04-13 MED FILL — ?MECLIZINE 25 MG TABLET: 25 | 10 days supply | Qty: 30 | Fill #0

## 2016-04-13 NOTE — ED Provider Notes (Signed)
[facility]-EMERGENCY DEPT Provider Note   CSN: [id number] Arrival date & time: 04/13/16  0850     History   Chief Complaint Chief Complaint  Patient presents with  . Dizziness  . Tinnitus    HPI Kayla Woodard is a 60 y.o. female.  Patient is 60 yo F with PMH of sickle cell anemia, presenting with chief complaint of "dizziness" and bilateral tinnitus starting this morning. Patient states she woke up feeling dizzy, had difficulty taking shower because of dizziness, and also had "ringing in both ears." Symptoms have been constant and not associated with change in position. Initially, she noted gait imbalance when she woke up, but has had no difficulty walking since. No hx of BPV. She denies any headache, vision changes, numbness, or weakness. No chest pain, palpitations, or shortness of breath. She has had similar episodes of dizziness and tinnitus in the past. She denies any pain related to sickle cell. States she takes Tramadol daily for pain relief, and denies recent aspirin ingestion.      Past Medical History:  Diagnosis Date  . Anemia   . Back pain, chronic   . Hypertension   . Sickle cell anemia [facility])     Patient Active Problem List   Diagnosis Date Noted  . Chronic pain 01/11/2016  . COUGH 11/15/2009  . OTALGIA 06/27/2009  . BACK PAIN, THORACIC REGION 06/27/2009  . HYPOKALEMIA 03/29/2009  . NECK SPRAIN AND STRAIN 03/29/2009  . Sickle cell anemia (HCC) 03/21/2009  . DEPRESSION 03/21/2009  . ESSENTIAL HYPERTENSION, BENIGN 03/21/2009  . ALLERGIC RHINITIS 03/21/2009  . GERD 03/21/2009  . LUMBAGO 03/21/2009  . CATARACT EXTRACTION, HX OF 03/21/2009    Past Surgical History:  Procedure Laterality Date  . CESAREAN SECTION      OB History    No data available       Home Medications    Prior to Admission medications   Medication Sig Start Date End Date Taking? Authorizing Provider  losartan (COZAAR) 100 MG tablet Take 1 tablet (100 mg total) by mouth daily.  04/09/16  Yes Jaclyn Shaggy, MD  omeprazole (PRILOSEC) 20 MG capsule Take 2 capsules (40 mg total) by mouth daily. 04/09/16  Yes Jaclyn Shaggy, MD  traMADol (ULTRAM) 50 MG tablet Take 1 tablet (50 mg total) by mouth 3 (three) times daily. 04/10/16  Yes Jaclyn Shaggy, MD  amLODipine (NORVASC) 5 MG tablet Take 1 tablet (5 mg total) by mouth daily. Patient not taking: Reported on 04/13/2016 04/09/16   Jaclyn Shaggy, MD  aspirin EC 81 MG tablet Take 81 mg by mouth daily.    Historical Provider, MD  Multiple Vitamin (MULTIVITAMIN WITH MINERALS) TABS tablet Take 1 tablet by mouth daily.    Historical Provider, MD    Family History Family History  Problem Relation Age of Onset  . Hypertension Mother   . Sickle cell anemia Father     Social History Social History  Substance Use Topics  . Smoking status: Never Smoker  . Smokeless tobacco: Never Used  . Alcohol use No     Allergies   Oxycodone and Vicodin [hydrocodone-acetaminophen]   Review of Systems Review of Systems  Constitutional: Negative for chills and fever.  HENT: Positive for tinnitus. Negative for ear discharge, ear pain, facial swelling, hearing loss and sore throat.   Eyes: Negative for pain and visual disturbance.  Respiratory: Negative for cough and shortness of breath.   Cardiovascular: Negative for chest pain, palpitations and leg swelling.  Gastrointestinal: Negative for  abdominal pain, blood in stool, nausea and vomiting.  Genitourinary: Negative for dysuria, flank pain and hematuria.  Musculoskeletal: Negative for back pain and neck pain.  Skin: Negative for color change and rash.  Neurological: Positive for dizziness. Negative for seizures, syncope, weakness, numbness and headaches.     Physical Exam Updated Vital Signs BP 133/83   Pulse 63   Temp 98.2 F (36.8 C)   Resp 17   Ht 5\' 4"  (1.626 m)   Wt 77.6 kg   SpO2 100%   BMI 29.35 kg/m   Physical Exam  Constitutional: She is oriented to person, place,  and time. She appears well-developed and well-nourished. No distress.  Resting comfortably in bed.  HENT:  Head: Normocephalic and atraumatic.  Right Ear: External ear normal.  Left Ear: External ear normal.  Mouth/Throat: Oropharynx is clear and moist.  TMs and ear canals normal bilaterally. No nasal mucosal edema or erythema. No frontal or maxillary sinus tenderness. Negative [employer] Hallpike.  Eyes: Conjunctivae and EOM are normal. Pupils are equal, round, and reactive to light.  Neck: Normal range of motion. Neck supple.  Cardiovascular: Normal rate, regular rhythm, normal heart sounds and intact distal pulses.   Pulmonary/Chest: Effort normal and breath sounds normal. No respiratory distress.  Abdominal: Soft. There is no tenderness.  Musculoskeletal: Normal range of motion. She exhibits no edema or tenderness.  Neurological: She is alert and oriented to person, place, and time.  Speech is clear and goal oriented, follows commands. Cranial nerves III - XII without deficit, no facial droop. Normal strength in upper and lower extremities bilaterally, strong and equal grip strength. Sensation normal to light and sharp touch. Moves extremities without ataxia, coordination intact. Normal finger to nose and rapid alternating movements. Neg Romberg, no pronator drift. Normal gait.  Skin: Skin is warm and dry.  Psychiatric: She has a normal mood and affect.  Nursing note and vitals reviewed.    ED Treatments / Results  Labs (all labs ordered are listed, but only abnormal results are displayed) Labs Reviewed  CBC WITH DIFFERENTIAL/PLATELET - Abnormal; Notable for the following:       Result Value   WBC 3.9 (*)    Hemoglobin 10.1 (*)    HCT 28.2 (*)    MCV 70.0 (*)    MCH 25.1 (*)    RDW 17.4 (*)    All other components within normal limits  BASIC METABOLIC PANEL - Abnormal; Notable for the following:    BUN 5 (*)    All other components within normal limits    EKG  EKG  Interpretation None       Radiology No results found.  Procedures Procedures (including critical care time)  Medications Ordered in ED Medications  ondansetron (ZOFRAN) injection 4 mg (4 mg Intravenous Given 04/13/16 1038)  sodium chloride 0.9 % bolus 1,000 mL (1,000 mLs Intravenous New Bag/Given 04/13/16 1039)  meclizine (ANTIVERT) tablet 25 mg (25 mg Oral Given 04/13/16 1039)     Initial Impression / Assessment and Plan / ED Course  I have reviewed the triage vital signs and the nursing notes.  Pertinent labs & imaging results that were available during my care of the patient were reviewed by me and considered in my medical decision making (see chart for details).  Clinical Course   Patient is 60 yo F with PMH of sickle cell anemia, presenting with dizziness and bilateral tinnitus starting this morning. Normal HENT exam, no nystagmus, and negative [employer] Hallpike. Normal  neuro exam, and no concern for cerebellar stroke. CT imaging deferred. Patient not having any pain and does not appear to be having sickle cell crisis. CBC and BMP unremarkable from baseline. Given IVF, Zofran, and Antivert. On reassessment, patient states symptoms completely resolved and she wants to be d/c home. Ambulated without difficulty. Given prescription for Antivert, and advised to f/u with PCP. Strict return precautions to ED discussed for worsening symptoms including headache, dizziness, numbness, weakness, slurred speech, or severe pain.  Final Clinical Impressions(s) / ED Diagnoses   Final diagnoses:  Dizziness  Tinnitus of both ears  Hb-SS disease without crisis [facility])    New Prescriptions Discharge Medication List as of 04/13/2016 12:58 PM    START taking these medications   Details  meclizine (ANTIVERT) 25 MG tablet Take 1 tablet (25 mg total) by mouth 3 (three) times daily as needed for dizziness., Starting Fri 04/13/2016, Print         Clementina Mareno F de Novinger II, [place] 04/13/16 1943      Margarita Grizzle, MD 04/15/16 [id number]

## 2016-04-13 NOTE — ED Notes (Signed)
Placed patient into a gown on the monitor 

## 2016-04-13 NOTE — ED Triage Notes (Signed)
Pt reports dizziness and bila tinnitus when she woke up this am.  Pt reports her gait was off balance which is resolved.

## 2016-04-13 NOTE — ED Triage Notes (Signed)
Per [facility], Pt is coming from home with complaints of sickle cell crisis that started this morning. Pt reports Hx of HTN. Took medication this morning. Vitals per EMS: 140/80, 83 HR, 16 RR, 97% on RA

## 2016-04-13 NOTE — Discharge Instructions (Signed)
Please take Antivert as needed for dizziness, and follow up with your primary care provider about your recent symptoms and ED visit. Continue to take all other home medications as prescribed.

## 2016-04-23 ENCOUNTER — Encounter: Payer: Self-pay | Admitting: Family Medicine

## 2016-04-23 ENCOUNTER — Ambulatory Visit: Payer: Self-pay | Attending: Family Medicine | Admitting: Family Medicine

## 2016-04-23 VITALS — BP 123/76 | HR 90 | Temp 98.2°F | Ht 64.0 in | Wt 169.6 lb

## 2016-04-23 DIAGNOSIS — H8110 Benign paroxysmal vertigo, unspecified ear: Secondary | ICD-10-CM | POA: Insufficient documentation

## 2016-04-23 DIAGNOSIS — Z9889 Other specified postprocedural states: Secondary | ICD-10-CM | POA: Insufficient documentation

## 2016-04-23 DIAGNOSIS — G8929 Other chronic pain: Secondary | ICD-10-CM | POA: Insufficient documentation

## 2016-04-23 DIAGNOSIS — R51 Headache: Secondary | ICD-10-CM | POA: Insufficient documentation

## 2016-04-23 DIAGNOSIS — Z79899 Other long term (current) drug therapy: Secondary | ICD-10-CM | POA: Insufficient documentation

## 2016-04-23 DIAGNOSIS — I10 Essential (primary) hypertension: Secondary | ICD-10-CM | POA: Insufficient documentation

## 2016-04-23 DIAGNOSIS — Z7982 Long term (current) use of aspirin: Secondary | ICD-10-CM | POA: Insufficient documentation

## 2016-04-23 DIAGNOSIS — Z0000 Encounter for general adult medical examination without abnormal findings: Secondary | ICD-10-CM

## 2016-04-23 DIAGNOSIS — H8113 Benign paroxysmal vertigo, bilateral: Secondary | ICD-10-CM

## 2016-04-23 DIAGNOSIS — Z9119 Patient's noncompliance with other medical treatment and regimen: Secondary | ICD-10-CM | POA: Insufficient documentation

## 2016-04-23 DIAGNOSIS — Z888 Allergy status to other drugs, medicaments and biological substances status: Secondary | ICD-10-CM | POA: Insufficient documentation

## 2016-04-23 DIAGNOSIS — K219 Gastro-esophageal reflux disease without esophagitis: Secondary | ICD-10-CM | POA: Insufficient documentation

## 2016-04-23 MED ORDER — CETIRIZINE HCL 10 MG PO TABS: 10.0000 mg | ORAL_TABLET | Freq: Every day | ORAL | 3 refills | Status: DC

## 2016-04-23 MED FILL — ?CETIRIZINE HCL 10 MG TABLE: 10 | 30 days supply | Qty: 30 | Fill #0

## 2016-04-23 NOTE — Patient Instructions (Signed)

## 2016-04-23 NOTE — Progress Notes (Signed)
Subjective:  Patient ID: Kayla Woodard, female    DOB: 1956-02-11  Age: 60 y.o. MRN: [id number]  CC: Hypertension; Follow-up (ED 11 days ago); Dizziness; and Headache   HPI Kayla Woodard is a 60 year old female with a history of sickle cell anemia, hypertension, GERD who comes into the clinic for a follow-up visit.  She was recently seen at the urgent care for vertigo and placed on meclizine however she has not been taking it due to sedating side effects. She complains of dizziness worse with change in position or turning of her head, associated tinnitus but no hearing loss, nausea or vomiting. She denies sinus headache or pressure and has not had a fever.  She informs me that she is unable to follow-up with the sickle cell clinic due to immigration issues but remains on tramadol and is in touch with the sickle cell agency which would take her to [facility] if need be.  Compliant with her antihypertensive and takes omeprazole with control of her reflux symptoms.   Past Medical History:  Diagnosis Date  . Anemia   . Back pain, chronic   . Hypertension   . Sickle cell anemia (HCC)     Past Surgical History:  Procedure Laterality Date  . CESAREAN SECTION      Allergies  Allergen Reactions  . Oxycodone Itching  . Vicodin [Hydrocodone-Acetaminophen] Itching     Outpatient Medications Prior to Visit  Medication Sig Dispense Refill  . amLODipine (NORVASC) 5 MG tablet Take 1 tablet (5 mg total) by mouth daily. 30 tablet 3  . losartan (COZAAR) 100 MG tablet Take 1 tablet (100 mg total) by mouth daily. 30 tablet 3  . omeprazole (PRILOSEC) 20 MG capsule Take 2 capsules (40 mg total) by mouth daily. 60 capsule 3  . traMADol (ULTRAM) 50 MG tablet Take 1 tablet (50 mg total) by mouth 3 (three) times daily. 90 tablet 0  . aspirin EC 81 MG tablet Take 81 mg by mouth daily.    . meclizine (ANTIVERT) 25 MG tablet Take 1 tablet (25 mg total) by mouth 3 (three) times daily as needed for  dizziness. (Patient not taking: Reported on 04/23/2016) 30 tablet 0  . Multiple Vitamin (MULTIVITAMIN WITH MINERALS) TABS tablet Take 1 tablet by mouth daily.     No facility-administered medications prior to visit.     ROS Review of Systems  Constitutional: Negative for activity change, appetite change and fatigue.  HENT: Positive for tinnitus. Negative for congestion, sinus pressure and sore throat.   Eyes: Negative for visual disturbance.  Respiratory: Negative for cough, chest tightness, shortness of breath and wheezing.   Cardiovascular: Negative for chest pain and palpitations.  Gastrointestinal: Negative for abdominal distention, abdominal pain and constipation.  Endocrine: Negative for polydipsia.  Genitourinary: Negative for dysuria and frequency.  Musculoskeletal: Negative for arthralgias and back pain.  Skin: Negative for rash.  Neurological: Positive for dizziness. Negative for tremors, light-headedness and numbness.  Hematological: Does not bruise/bleed easily.  Psychiatric/Behavioral: Negative for agitation and behavioral problems.    Objective:  BP 123/76 (BP Location: Right Arm, Patient Position: Sitting, Cuff Size: Large)   Pulse 90   Temp 98.2 F (36.8 C) (Oral)   Ht 5\' 4"  (1.626 m)   Wt 169 lb 9.6 oz (76.9 kg)   SpO2 100%   BMI 29.11 kg/m   BP/Weight 04/23/2016 04/13/2016 02/13/2016  Systolic BP 123 137 135  Diastolic BP 76 89 81  Wt. (Lbs) 169.6 171 -  BMI  29.11 29.35 -      Physical Exam  Constitutional: She is oriented to person, place, and time. She appears well-developed and well-nourished.  Cardiovascular: Normal rate, normal heart sounds and intact distal pulses.   No murmur heard. Pulmonary/Chest: Effort normal and breath sounds normal. She has no wheezes. She has no rales. She exhibits no tenderness.  Abdominal: Soft. Bowel sounds are normal. She exhibits no distension and no mass. There is no tenderness.  Musculoskeletal: Normal range of  motion.  Neurological: She is alert and oriented to person, place, and time.     Assessment & Plan:   1. Essential hypertension Controlled Continue antihypertensives Low-sodium diet  2. Benign paroxysmal positional vertigo due to bilateral vestibular disorder Controlled due to noncompliance with meclizine Advised to take meclizine at night due to sedating side effects Zyrtec as needed as dizziness could also be due to sinus symptoms  3. Gastroesophageal reflux disease, esophagitis presence not specified Controlled  4. Health care maintenance Complete physical in next visit   Meds ordered this encounter  Medications  . cetirizine (ZYRTEC) 10 MG tablet    Sig: Take 1 tablet (10 mg total) by mouth daily.    Dispense:  30 tablet    Refill:  3    Follow-up: Return in about 3 weeks (around 05/14/2016) for Complete physical exam.   Jaclyn ShaggyEnobong Amao MD

## 2016-05-09 MED FILL — LOSARTAN POTASSIUM 100 MG T: 100 | 30 days supply | Qty: 30 | Fill #1

## 2016-05-09 MED FILL — ?OMEPRAZOLE DR 20 MG CAPSUL: 20 | 30 days supply | Qty: 60 | Fill #1

## 2016-05-09 MED FILL — AMLODIPINE BESYLATE 5 MG TA: 5 | 30 days supply | Qty: 30 | Fill #1

## 2016-05-14 ENCOUNTER — Encounter: Payer: Self-pay | Admitting: Family Medicine

## 2016-05-14 ENCOUNTER — Telehealth: Payer: Self-pay | Admitting: Family Medicine

## 2016-05-14 NOTE — Telephone Encounter (Signed)
Medication Refill: traMADol (ULTRAM) 50 MG tablet  ° °

## 2016-05-15 ENCOUNTER — Telehealth: Payer: Self-pay

## 2016-05-15 MED ORDER — TRAMADOL HCL 50 MG PO TABS: 50.0000 mg | ORAL_TABLET | Freq: Three times a day (TID) | ORAL | 2 refills | Status: DC

## 2016-05-15 NOTE — Telephone Encounter (Signed)
Ready for pick up

## 2016-05-15 NOTE — Telephone Encounter (Signed)
Writer called patient to let her know that the requested tramadol prescription is ready for pick up.

## 2016-05-21 MED FILL — traMADol HCL 50 MG TABS: 50 | 30 days supply | Qty: 90 | Fill #0

## 2016-06-20 MED FILL — ?CETIRIZINE HCL 10 MG TABLE: 10 | 30 days supply | Qty: 30 | Fill #1

## 2016-06-20 MED FILL — traMADol HCL 50 MG TABS: 50 | 30 days supply | Qty: 90 | Fill #1

## 2016-06-20 MED FILL — OMEPRAZOLE DR 20 MG CAPSULE: 20 | 30 days supply | Qty: 60 | Fill #2

## 2016-06-20 MED FILL — LOSARTAN POTASSIUM 100 MG T: 100 | 30 days supply | Qty: 30 | Fill #2

## 2016-06-20 MED FILL — AMLODIPINE BESYLATE 5 MG TA: 5 | 30 days supply | Qty: 30 | Fill #2

## 2016-06-22 ENCOUNTER — Telehealth: Payer: Self-pay | Admitting: Family Medicine

## 2016-06-22 NOTE — Telephone Encounter (Signed)
Patient is needing a refill for tramadol. °Please follow up. ° °

## 2016-06-27 NOTE — Telephone Encounter (Signed)
Writer spoke with patient to let her know that she has two refills on her tramadol that was given to her in November.  Patient stated understanding.

## 2016-06-27 NOTE — Telephone Encounter (Signed)
I refilled this on 05/15/16 with 2 refills so she should have enough.

## 2016-07-20 MED FILL — OMEPRAZOLE DR 20 MG CAPSULE: 20 | 30 days supply | Qty: 60 | Fill #3

## 2016-07-20 MED FILL — LOSARTAN POTASSIUM 100 MG T: 100 | 30 days supply | Qty: 30 | Fill #3

## 2016-07-20 MED FILL — traMADol HCL 50 MG TABS: 50 | 30 days supply | Qty: 90 | Fill #2

## 2016-07-20 MED FILL — ?CETIRIZINE HCL 10 MG TABLE: 10 | 30 days supply | Qty: 30 | Fill #2

## 2016-07-20 MED FILL — AMLODIPINE BESYLATE 5 MG TA: 5 | 30 days supply | Qty: 30 | Fill #3

## 2016-08-27 ENCOUNTER — Ambulatory Visit: Payer: Self-pay | Attending: Family Medicine | Admitting: Family Medicine

## 2016-08-27 ENCOUNTER — Encounter: Payer: Self-pay | Admitting: Family Medicine

## 2016-08-27 VITALS — BP 120/69 | HR 78 | Temp 98.3°F | Ht 64.0 in | Wt 171.2 lb

## 2016-08-27 DIAGNOSIS — K219 Gastro-esophageal reflux disease without esophagitis: Secondary | ICD-10-CM | POA: Insufficient documentation

## 2016-08-27 DIAGNOSIS — D571 Sickle-cell disease without crisis: Secondary | ICD-10-CM | POA: Insufficient documentation

## 2016-08-27 DIAGNOSIS — R42 Dizziness and giddiness: Secondary | ICD-10-CM | POA: Insufficient documentation

## 2016-08-27 DIAGNOSIS — Z885 Allergy status to narcotic agent status: Secondary | ICD-10-CM | POA: Insufficient documentation

## 2016-08-27 DIAGNOSIS — Z7982 Long term (current) use of aspirin: Secondary | ICD-10-CM | POA: Insufficient documentation

## 2016-08-27 DIAGNOSIS — G8929 Other chronic pain: Secondary | ICD-10-CM | POA: Insufficient documentation

## 2016-08-27 DIAGNOSIS — I10 Essential (primary) hypertension: Secondary | ICD-10-CM | POA: Insufficient documentation

## 2016-08-27 DIAGNOSIS — M545 Low back pain: Secondary | ICD-10-CM | POA: Insufficient documentation

## 2016-08-27 DIAGNOSIS — K120 Recurrent oral aphthae: Secondary | ICD-10-CM | POA: Insufficient documentation

## 2016-08-27 LAB — COMPLETE METABOLIC PANEL WITH GFR
ALBUMIN: 4.1 g/dL (ref 3.6–5.1)
ALK PHOS: 84 U/L (ref 33–130)
ALT: 18 U/L (ref 6–29)
AST: 16 U/L (ref 10–35)
BUN: 7 mg/dL (ref 7–25)
CALCIUM: 9.2 mg/dL (ref 8.6–10.4)
CHLORIDE: 107 mmol/L (ref 98–110)
CO2: 29 mmol/L (ref 20–31)
Creat: 0.83 mg/dL (ref 0.50–1.05)
GFR, EST NON AFRICAN AMERICAN: 78 mL/min (ref >=60)
GFR, Est African American: >89 mL/min (ref >=60)
Glucose, Bld: 95 mg/dL (ref 65–99)
POTASSIUM: 4.4 mmol/L (ref 3.5–5.3)
SODIUM: 141 mmol/L (ref 135–146)
Total Bilirubin: 1.3 mg/dL — ABNORMAL HIGH (ref 0.2–1.2)
Total Protein: 7.1 g/dL (ref 6.1–8.1)

## 2016-08-27 LAB — CBC WITH DIFFERENTIAL/PLATELET
BASOS PCT: 1 %
Basophils Absolute: 40 cells/uL (ref 0–200)
EOS PCT: 2 %
Eosinophils Absolute: 80 cells/uL (ref 15–500)
HCT: 29.7 % — ABNORMAL LOW (ref 35.0–45.0)
HEMOGLOBIN: 10.1 g/dL — AB (ref 11.7–15.5)
LYMPHS ABS: 1280 {cells}/uL (ref 850–3900)
Lymphocytes Relative: 32 %
MCH: 25.1 pg — ABNORMAL LOW (ref 27.0–33.0)
MCHC: 34 g/dL (ref 32.0–36.0)
MCV: 73.7 fL — ABNORMAL LOW (ref 80.0–100.0)
MONO ABS: 200 {cells}/uL (ref 200–950)
MPV: 9.9 fL (ref 7.5–12.5)
Monocytes Relative: 5 %
NEUTROS ABS: 2400 {cells}/uL (ref 1500–7800)
Neutrophils Relative %: 60 %
Platelets: 159 10*3/uL (ref 140–400)
RBC: 4.03 MIL/uL (ref 3.80–5.10)
RDW: 18.1 % — ABNORMAL HIGH (ref 11.0–15.0)
WBC: 4 10*3/uL (ref 3.8–10.8)

## 2016-08-27 LAB — LIPID PANEL
CHOL/HDL RATIO: 2.7 ratio (ref <5.0)
CHOLESTEROL: 143 mg/dL (ref <200)
HDL: 53 mg/dL (ref >50)
LDL Cholesterol: 83 mg/dL (ref <100)
TRIGLYCERIDES: 35 mg/dL (ref <150)
VLDL: 7 mg/dL (ref <30)

## 2016-08-27 MED ORDER — AMLODIPINE BESYLATE 5 MG PO TABS: 5.0000 mg | ORAL_TABLET | Freq: Every day | ORAL | 5 refills | Status: DC

## 2016-08-27 MED ORDER — CETIRIZINE HCL 10 MG PO TABS: 10.0000 mg | ORAL_TABLET | Freq: Every day | ORAL | 5 refills | Status: DC

## 2016-08-27 MED ORDER — MECLIZINE HCL 25 MG PO TABS: 25.0000 mg | ORAL_TABLET | Freq: Two times a day (BID) | ORAL | 2 refills | Status: DC | PRN

## 2016-08-27 MED ORDER — OMEPRAZOLE 20 MG PO CPDR: 40.0000 mg | DELAYED_RELEASE_CAPSULE | Freq: Every day | ORAL | 3 refills | Status: DC

## 2016-08-27 MED ORDER — TRAMADOL HCL 50 MG PO TABS: 50.0000 mg | ORAL_TABLET | Freq: Three times a day (TID) | ORAL | 2 refills | Status: DC

## 2016-08-27 MED ORDER — BENZOCAINE 20 % MT GEL: OROMUCOSAL | 1 refills | Status: DC

## 2016-08-27 MED ORDER — LOSARTAN POTASSIUM 100 MG PO TABS: 100.0000 mg | ORAL_TABLET | Freq: Every day | ORAL | 5 refills | Status: DC

## 2016-08-27 MED FILL — AMLODIPINE BESYLATE 5 MG TA: 5 | 30 days supply | Qty: 30 | Fill #0

## 2016-08-27 MED FILL — ?MECLIZINE 25 MG TABLET: 25 | 20 days supply | Qty: 40 | Fill #0

## 2016-08-27 MED FILL — LOSARTAN POTASSIUM 100 MG T: 100 | 30 days supply | Qty: 30 | Fill #0

## 2016-08-27 MED FILL — OMEPRAZOLE DR 20 MG CAPSULE: 20 | 30 days supply | Qty: 60 | Fill #0

## 2016-08-27 MED FILL — traMADol HCL 50 MG TABS: 50 | 30 days supply | Qty: 90 | Fill #0

## 2016-08-27 MED FILL — ?CETIRIZINE HCL 10 MG TABLE: 10 | 30 days supply | Qty: 30 | Fill #0

## 2016-08-27 NOTE — Progress Notes (Signed)
Subjective:  Patient ID: Kayla Woodard, female    DOB: [birth year]  Age: 61 y.o. MRN: [id number]  CC: Hypertension; Dizziness (fell in BR yesterday); Sickle Cell Anemia (very painful today); and Mouth Lesions   HPI [name] is a 61 year old female with a history of sickle cell anemia, hypertension, GERD who comes into the clinic for a follow-up visit.  She complains of dizziness worse with change in position or turning of her head, associated tinnitus but no hearing loss, nausea or vomiting, headaches. She took a fall in the bathroom yesterday. She denies sinus headache or pressure and has not had a fever. Does have meclizine and her med list but has not been taking it due to sedating side effects as she works at the [employer] and this would make her sleepy.  She complains of an aphthous ulcer on the left side of her cheek for the last couple of days.  She informs me that she is unable to follow-up with the sickle cell clinic due to immigration issues but remains on tramadol and is in touch with the sickle cell agency which would take her to [facility] if need be. Today she does have more pains than usual in all her joints.  Compliant with her antihypertensive and takes omeprazole with control of her reflux symptoms.  Past Medical History:  Diagnosis Date  . Anemia   . Back pain, chronic   . Hypertension   . Sickle cell anemia (HCC)     Past Surgical History:  Procedure Laterality Date  . CESAREAN SECTION      Allergies  Allergen Reactions  . Oxycodone Itching  . Vicodin [Hydrocodone-Acetaminophen] Itching     Outpatient Medications Prior to Visit  Medication Sig Dispense Refill  . aspirin EC 81 MG tablet Take 81 mg by mouth daily.    . Multiple Vitamin (MULTIVITAMIN WITH MINERALS) TABS tablet Take 1 tablet by mouth daily.    Marland Kitchen amLODipine (NORVASC) 5 MG tablet Take 1 tablet (5 mg total) by mouth daily. 30 tablet 3  . cetirizine (ZYRTEC) 10 MG tablet Take 1 tablet  (10 mg total) by mouth daily. 30 tablet 3  . losartan (COZAAR) 100 MG tablet Take 1 tablet (100 mg total) by mouth daily. 30 tablet 3  . meclizine (ANTIVERT) 25 MG tablet Take 1 tablet (25 mg total) by mouth 3 (three) times daily as needed for dizziness. 30 tablet 0  . omeprazole (PRILOSEC) 20 MG capsule Take 2 capsules (40 mg total) by mouth daily. 60 capsule 3  . traMADol (ULTRAM) 50 MG tablet Take 1 tablet (50 mg total) by mouth 3 (three) times daily. 90 tablet 2   No facility-administered medications prior to visit.     ROS Review of Systems  Constitutional: Negative for activity change, appetite change and fatigue.  HENT: Negative for congestion, sinus pressure and sore throat.   Eyes: Negative for visual disturbance.  Respiratory: Negative for cough, chest tightness, shortness of breath and wheezing.   Cardiovascular: Negative for chest pain and palpitations.  Gastrointestinal: Negative for abdominal distention, abdominal pain and constipation.  Endocrine: Negative for polydipsia.  Genitourinary: Negative for dysuria and frequency.  Musculoskeletal: Positive for arthralgias.  Skin: Negative for rash.  Neurological: Positive for light-headedness. Negative for tremors and numbness.  Hematological: Does not bruise/bleed easily.  Psychiatric/Behavioral: Negative for agitation and behavioral problems.    Objective:  BP 120/69 (BP Location: Right Arm, Patient Position: Sitting, Cuff Size: Small)   Pulse 78  Temp 98.3 F (36.8 C) (Oral)   Ht 5\' 4"  (1.626 m)   Wt 171 lb 3.2 oz (77.7 kg)   SpO2 100%   BMI 29.39 kg/m   BP/Weight 08/27/2016 04/23/2016 04/13/2016  Systolic BP 120 123 137  Diastolic BP 69 76 89  Wt. (Lbs) 171.2 169.6 171  BMI 29.39 29.11 29.35      Physical Exam  Constitutional: She is oriented to person, place, and time. She appears well-developed and well-nourished.  HENT:  Right Ear: External ear normal.  Left Ear: External ear normal.  Aphthous ulcer  on buccal mucosa of the left cheek  Cardiovascular: Normal rate, normal heart sounds and intact distal pulses.   No murmur heard. Pulmonary/Chest: Effort normal and breath sounds normal. She has no wheezes. She has no rales. She exhibits no tenderness.  Abdominal: Soft. Bowel sounds are normal. She exhibits no distension and no mass. There is no tenderness.  Musculoskeletal: Normal range of motion.  Neurological: She is alert and oriented to person, place, and time.    CMP Latest Ref Rng & Units 04/13/2016 02/13/2016 01/11/2016  Glucose 65 - 99 mg/dL 93 96 70  BUN 6 - 20 mg/dL 5(L) 7 9  Creatinine 1.320.44 - 1.00 mg/dL 4.400.72 1.020.80 7.250.73  Sodium 135 - 145 mmol/L 139 142 139  Potassium 3.5 - 5.1 mmol/L 3.8 4.0 4.2  Chloride 101 - 111 mmol/L 106 103 106  CO2 22 - 32 mmol/L 27 - 25  Calcium 8.9 - 10.3 mg/dL 9.2 - 8.8  Total Protein 6.1 - 8.1 g/dL - - 7.4  Total Bilirubin 0.2 - 1.2 mg/dL - - 1.1  Alkaline Phos 33 - 130 U/L - - 90  AST 10 - 35 U/L - - 15  ALT 6 - 29 U/L - - 12    CBC Latest Ref Rng & Units 04/13/2016 02/13/2016 01/11/2016  WBC 4.0 - 10.5 K/uL 3.9(L) - 4.3  Hemoglobin 12.0 - 15.0 g/dL 10.1(L) 10.2(L) 10.9(L)  Hematocrit 36.0 - 46.0 % 28.2(L) 30.0(L) 32.5(L)  Platelets 150 - 400 K/uL 157 - 163    Lipid Panel     Component Value Date/Time   CHOL 152 01/11/2016 1045   TRIG 48 01/11/2016 1045   HDL 55 01/11/2016 1045   CHOLHDL 2.8 01/11/2016 1045   VLDL 10 01/11/2016 1045   LDLCALC 87 01/11/2016 1045    Assessment & Plan:   1. Hb-SS disease without crisis [facility](HCC) Patient encouraged to follow-up with the sickle cell agency No evidence of crisis or acute chest syndrome at this time. - traMADol (ULTRAM) 50 MG tablet; Take 1 tablet (50 mg total) by mouth 3 (three) times daily.  Dispense: 90 tablet; Refill: 2 - CBC with Differential/Platelet  2. Essential hypertension Controlled - losartan (COZAAR) 100 MG tablet; Take 1 tablet (100 mg total) by mouth daily.  Dispense: 30  tablet; Refill: 5 - amLODipine (NORVASC) 5 MG tablet; Take 1 tablet (5 mg total) by mouth daily.  Dispense: 30 tablet; Refill: 5 - COMPLETE METABOLIC PANEL WITH GFR - Lipid panel  3. Vertigo Could explain dizziness Advised patient that she could take medication at night due to sedating side effects - meclizine (ANTIVERT) 25 MG tablet; Take 1 tablet (25 mg total) by mouth 2 (two) times daily as needed for dizziness.  Dispense: 40 tablet; Refill: 2  4. Aphthous ulcer of mouth - benzocaine (HURRICAINE) 20 % GEL; Apply 4 times daily to aphthous ulcer until healed  Dispense: 30 g; Refill: 1  Meds ordered this encounter  Medications  . losartan (COZAAR) 100 MG tablet    Sig: Take 1 tablet (100 mg total) by mouth daily.    Dispense:  30 tablet    Refill:  5  . traMADol (ULTRAM) 50 MG tablet    Sig: Take 1 tablet (50 mg total) by mouth 3 (three) times daily.    Dispense:  90 tablet    Refill:  2  . omeprazole (PRILOSEC) 20 MG capsule    Sig: Take 2 capsules (40 mg total) by mouth daily.    Dispense:  60 capsule    Refill:  3  . meclizine (ANTIVERT) 25 MG tablet    Sig: Take 1 tablet (25 mg total) by mouth 2 (two) times daily as needed for dizziness.    Dispense:  40 tablet    Refill:  2  . cetirizine (ZYRTEC) 10 MG tablet    Sig: Take 1 tablet (10 mg total) by mouth daily.    Dispense:  30 tablet    Refill:  5  . amLODipine (NORVASC) 5 MG tablet    Sig: Take 1 tablet (5 mg total) by mouth daily.    Dispense:  30 tablet    Refill:  5  . benzocaine (HURRICAINE) 20 % GEL    Sig: Apply 4 times daily to aphthous ulcer until healed    Dispense:  30 g    Refill:  1    Follow-up: Return in about 1 month (around 09/24/2016) for complete physical exam and follow up on dizziness.   Jaclyn Shaggy MD

## 2016-08-27 NOTE — Progress Notes (Signed)
Med refills on everything- send meds here

## 2016-08-30 ENCOUNTER — Telehealth: Payer: Self-pay

## 2016-08-30 NOTE — Telephone Encounter (Signed)
-----   Message from Jaclyn ShaggyEnobong Amao, MD sent at 08/28/2016  1:46 PM EST ----- Cholesterol is normal, electrolytes are normal, she is mildly anemic but this is stable compared to her last complete blood count.

## 2016-08-30 NOTE — Telephone Encounter (Signed)
Writer called patient and discussed test results.  Patient stated understanding.

## 2016-09-22 ENCOUNTER — Encounter (HOSPITAL_COMMUNITY): Payer: Self-pay | Admitting: Emergency Medicine

## 2016-09-22 ENCOUNTER — Emergency Department (HOSPITAL_COMMUNITY): Payer: Self-pay

## 2016-09-22 ENCOUNTER — Other Ambulatory Visit: Payer: Self-pay

## 2016-09-22 ENCOUNTER — Emergency Department (HOSPITAL_COMMUNITY)
Admission: EM | Admit: 2016-09-22 | Discharge: 2016-09-22 | Disposition: A | Discharge status: Home / Self Care | Payer: Self-pay | Attending: Emergency Medicine | Admitting: Emergency Medicine

## 2016-09-22 DIAGNOSIS — G4489 Other headache syndrome: Secondary | ICD-10-CM | POA: Insufficient documentation

## 2016-09-22 DIAGNOSIS — D509 Iron deficiency anemia, unspecified: Secondary | ICD-10-CM | POA: Insufficient documentation

## 2016-09-22 DIAGNOSIS — R0789 Other chest pain: Secondary | ICD-10-CM | POA: Insufficient documentation

## 2016-09-22 DIAGNOSIS — Z7982 Long term (current) use of aspirin: Secondary | ICD-10-CM | POA: Insufficient documentation

## 2016-09-22 DIAGNOSIS — I10 Essential (primary) hypertension: Secondary | ICD-10-CM | POA: Insufficient documentation

## 2016-09-22 LAB — BASIC METABOLIC PANEL
Anion gap: 8 (ref 5–15)
BUN: 16 mg/dL (ref 6–20)
CHLORIDE: 104 mmol/L (ref 101–111)
CO2: 26 mmol/L (ref 22–32)
Calcium: 8.7 mg/dL — ABNORMAL LOW (ref 8.9–10.3)
Creatinine, Ser: 1.34 mg/dL — ABNORMAL HIGH (ref 0.44–1.00)
GFR calc Af Amer: 50 mL/min — ABNORMAL LOW (ref >60)
GFR calc non Af Amer: 43 mL/min — ABNORMAL LOW (ref >60)
Glucose, Bld: 142 mg/dL — ABNORMAL HIGH (ref 65–99)
POTASSIUM: 3.4 mmol/L — AB (ref 3.5–5.1)
Sodium: 138 mmol/L (ref 135–145)

## 2016-09-22 LAB — CBC
HEMATOCRIT: 23.8 % — AB (ref 36.0–46.0)
Hemoglobin: 8.5 g/dL — ABNORMAL LOW (ref 12.0–15.0)
MCH: 24.8 pg — AB (ref 26.0–34.0)
MCHC: 35.7 g/dL (ref 30.0–36.0)
MCV: 69.4 fL — AB (ref 78.0–100.0)
PLATELETS: 116 10*3/uL — AB (ref 150–400)
RBC: 3.43 MIL/uL — AB (ref 3.87–5.11)
RDW: 17.6 % — ABNORMAL HIGH (ref 11.5–15.5)
WBC: 4.9 10*3/uL (ref 4.0–10.5)

## 2016-09-22 LAB — I-STAT TROPONIN, ED
Troponin i, poc: 0 ng/mL (ref 0.00–0.08)
Troponin i, poc: 0 ng/mL (ref 0.00–0.08)

## 2016-09-22 LAB — RETICULOCYTES
RBC.: 3.43 MIL/uL — AB (ref 3.87–5.11)
RETIC CT PCT: 2.2 % (ref 0.4–3.1)
Retic Count, Absolute: 75.5 10*3/uL (ref 19.0–186.0)

## 2016-09-22 LAB — IRON AND TIBC
Iron: 49 ug/dL (ref 28–170)
Saturation Ratios: 16 % (ref 10.4–31.8)
TIBC: 315 ug/dL (ref 250–450)
UIBC: 266 ug/dL

## 2016-09-22 LAB — HEPATIC FUNCTION PANEL
ALT: 176 U/L — ABNORMAL HIGH (ref 14–54)
AST: 120 U/L — ABNORMAL HIGH (ref 15–41)
Albumin: 3.5 g/dL (ref 3.5–5.0)
Alkaline Phosphatase: 94 U/L (ref 38–126)
Bilirubin, Direct: 0.4 mg/dL (ref 0.1–0.5)
Indirect Bilirubin: 2.1 mg/dL — ABNORMAL HIGH (ref 0.3–0.9)
Total Bilirubin: 2.5 mg/dL — ABNORMAL HIGH (ref 0.3–1.2)
Total Protein: 7.1 g/dL (ref 6.5–8.1)

## 2016-09-22 LAB — FERRITIN: Ferritin: 477 ng/mL — ABNORMAL HIGH (ref 11–307)

## 2016-09-22 MED ORDER — FERROUS SULFATE 325 (65 FE) MG PO TABS: 325.0000 mg | ORAL_TABLET | Freq: Every day | ORAL | 0 refills | Status: DC

## 2016-09-22 MED ORDER — TRAMADOL HCL 50 MG PO TABS
50.0000 mg | ORAL_TABLET | Freq: Once | ORAL | Status: AC
  Administered 2016-09-22: 50 mg via ORAL
  Filled 2016-09-22: qty 1

## 2016-09-22 MED ORDER — FOLIC ACID 1 MG PO TABS: 2.0000 mg | ORAL_TABLET | Freq: Every day | ORAL | 0 refills | Status: DC

## 2016-09-22 MED ORDER — IOPAMIDOL (ISOVUE-370) INJECTION 76%
INTRAVENOUS | Status: AC
  Administered 2016-09-22: 80 mL via INTRAVENOUS
  Filled 2016-09-22: qty 100

## 2016-09-22 NOTE — Discharge Instructions (Signed)
Begin taking for acid and iron as prescribed daily. Please follow-up to primary care provider, as well as the hematologist office of Dr. Candise CheKale, and the gastroenterologist office for a colonoscopy and further evaluation and treatment of your anemia. Return to emergency department if you develop any new or worsening symptoms.

## 2016-09-22 NOTE — ED Notes (Signed)
Patient transported to X-ray 

## 2016-09-22 NOTE — ED Provider Notes (Signed)
[facility]-EMERGENCY DEPT Provider Note   CSN: [id number] Arrival date & time: 09/22/16  1324     History   Chief Complaint Chief Complaint  Patient presents with  . Chest Pain  . Headache  . Sickle Cell Pain Crisis    HPI Kayla Woodard is a 61 y.o. female with history of sickle cell who presents with acute onset left-sided chest pain with associated headache that began this morning. Patient describes her pain as sharp and worse with taking a breath. The pain does not radiate. She has had associated shortness of breath. Patient has associated frontal headache. Patient states she does not usually get headaches, however she states she would not describe her headache as severe. Patient denies that her symptoms are similar to her normal sickle cell pain. Patient takes tramadol every 8 hours for her sickle cell pain. Patient denies any associated fevers, abdominal pain, nausea, vomiting, urinary symptoms.  HPI  Past Medical History:  Diagnosis Date  . Anemia   . Back pain, chronic   . Hypertension   . Sickle cell anemia [facility])     Patient Active Problem List   Diagnosis Date Noted  . Vertigo 08/27/2016  . Chronic pain 01/11/2016  . COUGH 11/15/2009  . OTALGIA 06/27/2009  . BACK PAIN, THORACIC REGION 06/27/2009  . HYPOKALEMIA 03/29/2009  . NECK SPRAIN AND STRAIN 03/29/2009  . Sickle cell anemia (HCC) 03/21/2009  . DEPRESSION 03/21/2009  . ESSENTIAL HYPERTENSION, BENIGN 03/21/2009  . ALLERGIC RHINITIS 03/21/2009  . GERD 03/21/2009  . LUMBAGO 03/21/2009  . CATARACT EXTRACTION, HX OF 03/21/2009    Past Surgical History:  Procedure Laterality Date  . CESAREAN SECTION      OB History    No data available       Home Medications    Prior to Admission medications   Medication Sig Start Date End Date Taking? Authorizing Provider  amLODipine (NORVASC) 5 MG tablet Take 1 tablet (5 mg total) by mouth daily. 08/27/16  Yes Jaclyn Shaggy, MD  aspirin EC 81 MG tablet Take 81 mg by  mouth daily.   Yes Historical Provider, MD  cetirizine (ZYRTEC) 10 MG tablet Take 1 tablet (10 mg total) by mouth daily. Patient taking differently: Take 10 mg by mouth daily as needed for allergies.  08/27/16  Yes Jaclyn Shaggy, MD  losartan (COZAAR) 100 MG tablet Take 1 tablet (100 mg total) by mouth daily. 08/27/16  Yes Jaclyn Shaggy, MD  omeprazole (PRILOSEC) 20 MG capsule Take 2 capsules (40 mg total) by mouth daily. 08/27/16  Yes Jaclyn Shaggy, MD  traMADol (ULTRAM) 50 MG tablet Take 1 tablet (50 mg total) by mouth 3 (three) times daily. 08/27/16  Yes Jaclyn Shaggy, MD  benzocaine (HURRICAINE) 20 % GEL Apply 4 times daily to aphthous ulcer until healed Patient not taking: Reported on 09/22/2016 08/27/16   Jaclyn Shaggy, MD  ferrous sulfate 325 (65 FE) MG tablet Take 1 tablet (325 mg total) by mouth daily. 09/22/16   Emi Holes, PA-C  folic acid (FOLVITE) 1 MG tablet Take 2 tablets (2 mg total) by mouth daily. 09/22/16   Emi Holes, PA-C  meclizine (ANTIVERT) 25 MG tablet Take 1 tablet (25 mg total) by mouth 2 (two) times daily as needed for dizziness. 08/27/16   Jaclyn Shaggy, MD    Family History Family History  Problem Relation Age of Onset  . Hypertension Mother   . Sickle cell anemia Father     Social History Social History  Substance Use Topics  . Smoking status: Never Smoker  . Smokeless tobacco: Never Used  . Alcohol use No     Allergies   Oxycodone and Vicodin [hydrocodone-acetaminophen]   Review of Systems Review of Systems  Constitutional: Negative for chills and fever.  HENT: Negative for facial swelling and sore throat.   Respiratory: Positive for shortness of breath. Negative for cough.   Cardiovascular: Positive for chest pain.  Gastrointestinal: Negative for abdominal pain, nausea and vomiting.  Genitourinary: Negative for dysuria.  Musculoskeletal: Negative for back pain.  Skin: Negative for rash and wound.  Neurological: Positive for headaches.    Psychiatric/Behavioral: The patient is not nervous/anxious.      Physical Exam Updated Vital Signs BP 107/67   Pulse 75   Temp 98.2 F (36.8 C) (Oral)   Resp 18   Ht 5\' 1"  (1.549 m)   Wt 77.1 kg   SpO2 98%   BMI 32.12 kg/m   Physical Exam  Constitutional: She appears well-developed and well-nourished. No distress.  HENT:  Head: Normocephalic and atraumatic.  Mouth/Throat: Oropharynx is clear and moist. No oropharyngeal exudate.  Eyes: Conjunctivae and EOM are normal. Pupils are equal, round, and reactive to light. Right eye exhibits no discharge. Left eye exhibits no discharge. No scleral icterus.  Neck: Normal range of motion. Neck supple. No thyromegaly present.  Cardiovascular: Normal rate, regular rhythm, normal heart sounds and intact distal pulses.  Exam reveals no gallop and no friction rub.   No murmur heard. Pulmonary/Chest: Effort normal. No stridor. No respiratory distress. She has no wheezes. She has rales (left base).  Abdominal: Soft. Bowel sounds are normal. She exhibits no distension. There is no tenderness. There is no rebound and no guarding.  Musculoskeletal: She exhibits no edema.  Lymphadenopathy:    She has no cervical adenopathy.  Neurological: She is alert. Coordination normal.  CN 3-12 intact; normal sensation throughout; 5/5 strength in all 4 extremities; equal bilateral grip strength; no ataxia on finger to nose  Skin: Skin is warm and dry. No rash noted. She is not diaphoretic. No pallor.  Psychiatric: She has a normal mood and affect.  Nursing note and vitals reviewed.    ED Treatments / Results  Labs (all labs ordered are listed, but only abnormal results are displayed) Labs Reviewed  BASIC METABOLIC PANEL - Abnormal; Notable for the following:       Result Value   Potassium 3.4 (*)    Glucose, Bld 142 (*)    Creatinine, Ser 1.34 (*)    Calcium 8.7 (*)    GFR calc non Af Amer 43 (*)    GFR calc Af Amer 50 (*)    All other components  within normal limits  CBC - Abnormal; Notable for the following:    RBC 3.43 (*)    Hemoglobin 8.5 (*)    HCT 23.8 (*)    MCV 69.4 (*)    MCH 24.8 (*)    RDW 17.6 (*)    Platelets 116 (*)    All other components within normal limits  RETICULOCYTES - Abnormal; Notable for the following:    RBC. 3.43 (*)    All other components within normal limits  HEPATIC FUNCTION PANEL - Abnormal; Notable for the following:    AST 120 (*)    ALT 176 (*)    Total Bilirubin 2.5 (*)    Indirect Bilirubin 2.1 (*)    All other components within normal limits  FERRITIN - Abnormal;  Notable for the following:    Ferritin 477 (*)    All other components within normal limits  IRON AND TIBC  I-STAT TROPOININ, ED  I-STAT TROPOININ, ED    EKG  EKG Interpretation None       Radiology Dg Chest 2 View  Result Date: 09/22/2016 CLINICAL DATA:  Chest pain EXAM: CHEST  2 VIEW COMPARISON:  Nov 12, 2012 FINDINGS: There is no edema or consolidation. Heart is upper normal in size with pulmonary vascularity within normal limits. No adenopathy. There is degenerative change in the thoracic spine. There is atherosclerotic calcification in the aorta. IMPRESSION: No edema or consolidation. Aortic atherosclerosis. Stable cardiac silhouette. Electronically Signed   By: Bretta Bang III M.D.   On: 09/22/2016 09:35   Ct Head Wo Contrast  Result Date: 09/22/2016 CLINICAL DATA:  Headache EXAM: CT HEAD WITHOUT CONTRAST TECHNIQUE: Contiguous axial images were obtained from the base of the skull through the vertex without intravenous contrast. COMPARISON:  Nov 12, 2012 FINDINGS: Brain: The ventricles are normal in size and configuration. There is no intracranial mass, hemorrhage, extra-axial fluid collection, or midline shift. Gray-white compartments are normal. No acute infarct evident. Vascular: No hyperdense vessel. There is slight calcification in the left carotid siphon region. Skull: The bony calvarium appears intact.  Sinuses/Orbits: There is mild mucosal thickening in several ethmoid air cells bilaterally. Visualized paranasal sinuses elsewhere clear. Visualized orbits appear symmetric bilaterally. Other: Mastoids are clear although somewhat hypoplastic bilaterally. IMPRESSION: No intracranial mass, hemorrhage, or extra-axial fluid collection. Gray-white compartments are normal. Slight calcification in left carotid siphon region. Mild mucosal thickening in several ethmoid air cells bilaterally. Electronically Signed   By: Bretta Bang III M.D.   On: 09/22/2016 11:04   Ct Angio Chest Pe W And/or Wo Contrast  Result Date: 09/22/2016 CLINICAL DATA:  Chest pain.  Headache. EXAM: CT ANGIOGRAPHY CHEST WITH CONTRAST TECHNIQUE: Multidetector CT imaging of the chest was performed using the standard protocol during bolus administration of intravenous contrast. Multiplanar CT image reconstructions and MIPs were obtained to evaluate the vascular anatomy. CONTRAST:  80 cc Isovue 370 IV. COMPARISON:  Chest radiograph from earlier today. 03/22/2009 CT chest. FINDINGS: Cardiovascular: The study is high quality for the evaluation of pulmonary embolism. There are no filling defects in the central, lobar, segmental or subsegmental pulmonary artery branches to suggest acute pulmonary embolism. Great vessels are normal in course and caliber. Top-normal heart size . No significant pericardial fluid/thickening. Mediastinum/Nodes: No discrete thyroid nodules. Unremarkable esophagus. No pathologically enlarged axillary, mediastinal or hilar lymph nodes. Lungs/Pleura: No pneumothorax. No pleural effusion. No acute consolidative airspace disease or lung masses. Mild patchy dense tree-in-bud type opacities in the peripheral right middle lobe, right lower lobe and left lower lobe are unchanged since 03/22/2009, compatible with remote postinflammatory change. No new significant pulmonary nodules. Upper abdomen: Unremarkable. Musculoskeletal: No  aggressive appearing focal osseous lesions. Moderate thoracic spondylosis. Review of the MIP images confirms the above findings. IMPRESSION: No pulmonary embolism.  No active disease in the chest. Electronically Signed   By: Delbert Phenix M.D.   On: 09/22/2016 11:36    Procedures Procedures (including critical care time)  Medications Ordered in ED Medications  traMADol (ULTRAM) tablet 50 mg (50 mg Oral Given 09/22/16 1003)  iopamidol (ISOVUE-370) 76 % injection (80 mLs Intravenous Contrast Given 09/22/16 1117)     Initial Impression / Assessment and Plan / ED Course  I have reviewed the triage vital signs and the nursing notes.  Pertinent  labs & imaging results that were available during my care of the patient were reviewed by me and considered in my medical decision making (see chart for details).     Patient with chest pain and headache resolved in the ED. CBC shows hemoglobin 8.5, which is decreased from the past. CBC shows generalized microcytic anemia. However, reticulocyte count 2.2. CMP shows potassium 3.4, creatinine 1.34, calcium 8.7. Hepatic function panel shows AST 120, ALT 176, total bilirubin 2.5, indirect bilirubin 2.1. Patient without right upper quadrant tenderness, only mild left upper quadrant tenderness which patient associated as her chest pain. No pain at rest or without palpation. Spleen not palpable. Delta troponin negative. EKG shows NSR. Doubt ACS. CT angiogram chest shows no PE or active disease in the chest. CT head shows no intracranial mass, hemorrhage, or extra-axial fluid collection; slight calcification in the left carotid site region; mild mucosal thickening and several ethmoid air cells bilaterally. CXR shows no edema or consolidation, aortic atherosclerosis, in stable cardiac silhouette. Ferritin, iron, and TIBC pending per recommendation of hematologist, Dr. Myna Hidalgo a hematologist outpatient as well as gastroenterology for colonoscopy. Patient refused rectal exam  with Hemoccult in the ED. Patient made aware of all findings and importance of following up at appropriate specialists and PCP for recheck of liver function tests and further evaluation of anemia. Strict return precautions given. Patient understands and agrees with plan. Patient asymptomatic at discharge. Patient vitals stable throughout ED course and discharged in satisfactory condition. I discussed patient case with Dr. Dr. Clayborne Dana who guided the patient's management and agrees with plan.   Final Clinical Impressions(s) / ED Diagnoses   Final diagnoses:  Microcytic anemia  Other chest pain  Other headache syndrome    New Prescriptions Discharge Medication List as of 09/22/2016  3:18 PM    START taking these medications   Details  ferrous sulfate 325 (65 FE) MG tablet Take 1 tablet (325 mg total) by mouth daily., Starting Sat 09/22/2016, Print    folic acid (FOLVITE) 1 MG tablet Take 2 tablets (2 mg total) by mouth daily., Starting Sat 09/22/2016, Print         Emi Holes, PA-C 09/22/16 1552    Marily Memos, MD 09/23/16 [phone]

## 2016-09-22 NOTE — ED Notes (Signed)
Attempted to draw blood. Unsuccessful . RN notified.

## 2016-09-22 NOTE — ED Notes (Signed)
Pt remains in xray.

## 2016-09-22 NOTE — ED Triage Notes (Signed)
Pt. Stated, I have chest pain wih headache since last night.Kayla Woodard. Im also sickle cell anemia.

## 2016-09-24 ENCOUNTER — Encounter: Payer: Self-pay | Admitting: Family Medicine

## 2016-09-24 ENCOUNTER — Ambulatory Visit: Payer: Self-pay | Attending: Family Medicine | Admitting: Family Medicine

## 2016-09-24 ENCOUNTER — Telehealth: Payer: Self-pay

## 2016-09-24 VITALS — BP 117/81 | HR 79 | Temp 98.1°F | Resp 20 | Wt 167.6 lb

## 2016-09-24 DIAGNOSIS — R748 Abnormal levels of other serum enzymes: Secondary | ICD-10-CM | POA: Insufficient documentation

## 2016-09-24 DIAGNOSIS — K219 Gastro-esophageal reflux disease without esophagitis: Secondary | ICD-10-CM | POA: Insufficient documentation

## 2016-09-24 DIAGNOSIS — G8929 Other chronic pain: Secondary | ICD-10-CM | POA: Insufficient documentation

## 2016-09-24 DIAGNOSIS — M549 Dorsalgia, unspecified: Secondary | ICD-10-CM | POA: Insufficient documentation

## 2016-09-24 DIAGNOSIS — I10 Essential (primary) hypertension: Secondary | ICD-10-CM | POA: Insufficient documentation

## 2016-09-24 DIAGNOSIS — R079 Chest pain, unspecified: Secondary | ICD-10-CM | POA: Insufficient documentation

## 2016-09-24 DIAGNOSIS — D571 Sickle-cell disease without crisis: Secondary | ICD-10-CM | POA: Insufficient documentation

## 2016-09-24 DIAGNOSIS — Z7982 Long term (current) use of aspirin: Secondary | ICD-10-CM | POA: Insufficient documentation

## 2016-09-24 DIAGNOSIS — R630 Anorexia: Secondary | ICD-10-CM | POA: Insufficient documentation

## 2016-09-24 MED FILL — LOSARTAN POTASSIUM 100 MG T: 100 | 30 days supply | Qty: 30 | Fill #1

## 2016-09-24 MED FILL — traMADol HCL 50 MG TABS: 50 | 30 days supply | Qty: 90 | Fill #1

## 2016-09-24 MED FILL — ?MECLIZINE 25 MG TABLET: 25 | 20 days supply | Qty: 40 | Fill #1

## 2016-09-24 MED FILL — ?AMLODIPINE BESYLATE 5 MG T: 5 | 30 days supply | Qty: 30 | Fill #1

## 2016-09-24 MED FILL — ?OMEPRAZOLE DR 20 MG CAPSUL: 20 | 30 days supply | Qty: 60 | Fill #1

## 2016-09-24 NOTE — Patient Instructions (Signed)
Sickle Cell Anemia, Adult °Sickle cell anemia is a condition in which red blood cells have an abnormal “sickle” shape. This abnormal shape shortens the cells’ life span, which results in a lower than normal concentration of red blood cells in the blood. The sickle shape also causes the cells to clump together and block free blood flow through the blood vessels. As a result, the tissues and organs of the body do not receive enough oxygen. Sickle cell anemia causes organ damage and pain and increases the risk of infection. °What are the causes? °Sickle cell anemia is a genetic disorder. Those who receive two copies of the gene have the condition, and those who receive one copy have the trait. °What increases the risk? °The sickle cell gene is most common in people whose families originated in [place]. Other areas of the globe where sickle cell trait occurs include the [place], [place] and [place], the [place], and the [place]. °What are the signs or symptoms? °· Pain, especially in the extremities, back, chest, or abdomen (common). The pain may start suddenly or may develop following an illness, especially if there is dehydration. Pain can also occur due to overexertion or exposure to extreme temperature changes. °· Frequent severe bacterial infections, especially certain types of pneumonia and meningitis. °· Pain and swelling in the hands and feet. °· Decreased activity. °· Loss of appetite. °· Change in behavior. °· Headaches. °· Seizures. °· Shortness of breath or difficulty breathing. °· Vision changes. °· Skin ulcers. °Those with the trait may not have symptoms or they may have mild symptoms. °How is this diagnosed? °Sickle cell anemia is diagnosed with blood tests that demonstrate the genetic trait. It is often diagnosed during the newborn period, due to mandatory testing nationwide. A variety of blood tests, X-rays, CT scans, MRI scans, ultrasounds, and lung function tests may also be done to  monitor the condition. °How is this treated? °Sickle cell anemia may be treated with: °· Medicines. You may be given pain medicines, antibiotic medicines (to treat and prevent infections) or medicines to increase the production of certain types of hemoglobin. °· Fluids. °· Oxygen. °· Blood transfusions. ° °Follow these instructions at home: °· Drink enough fluid to keep your urine clear or pale yellow. Increase your fluid intake in hot weather and during exercise. °· Do not smoke. Smoking lowers oxygen levels in the blood. °· Only take over-the-counter or prescription medicines for pain, fever, or discomfort as directed by your health care provider. °· Take antibiotics as directed by your health care provider. Make sure you finish them it even if you start to feel better. °· Take supplements as directed by your health care provider. °· Consider wearing a medical alert bracelet. This tells anyone caring for you in an emergency of your condition. °· When traveling, keep your medical information, health care provider's names, and the medicines you take with you at all times. °· If you develop a fever, do not take medicines to reduce the fever right away. This could cover up a problem that is developing. Notify your health care provider. °· Keep all follow-up appointments with your health care provider. Sickle cell anemia requires regular medical care. °Contact a health care provider if: °You have a fever. °Get help right away if: °· You feel dizzy or faint. °· You have new abdominal pain, especially on the left side near the stomach area. °· You develop a persistent, often uncomfortable and painful penile erection (priapism). If this is not   treated immediately it will lead to impotence. °· You have numbness your arms or legs or you have a hard time moving them. °· You have a hard time with speech. °· You have a fever or persistent symptoms for more than 2-3 days. °· You have a fever and your symptoms suddenly get  worse. °· You have signs or symptoms of infection. These include: °? Chills. °? Abnormal tiredness (lethargy). °? Irritability. °? Poor eating. °? Vomiting. °· You develop pain that is not helped with medicine. °· You develop shortness of breath. °· You have pain in your chest. °· You are coughing up pus-like or bloody sputum. °· You develop a stiff neck. °· Your feet or hands swell or have pain. °· Your abdomen appears bloated. °· You develop joint pain. °This information is not intended to replace advice given to you by your health care provider. Make sure you discuss any questions you have with your health care provider. °Document Released: 09/26/2005 Document Revised: 01/06/2016 Document Reviewed: 01/28/2013 °Elsevier Interactive Patient Education © 2017 [employer] ° °

## 2016-09-24 NOTE — Telephone Encounter (Signed)
Patient will come back for labs on next visit.

## 2016-09-24 NOTE — Progress Notes (Signed)
Subjective:  Patient ID: Kayla Woodard, female    DOB: December 25, 1955  Age: 61 y.o. MRN: [id number]  CC: follow up visit  HPI Kayla Woodard  is a 61 year old female with a history of sickle cell anemia (previously followed at the sickle cell clinic), hypertension, GERD who comes into the clinic for a follow-up visit From the ED where she was seen 2 days ago for left-sided pleuritic chest pains. Labs revealed anemia with hemoglobin of 8.4, elevated LFTs with AST of 120, ALT of 176, Bili of 2.5, iron panel revealed slightly elevated ferritin of 477. Troponins were negative, EKG unremarkable. Chest x-ray revealed no edema or consolidation. CT angiogram of the chest was negative for PE, no active disease of the chest.  She reports doing well today and left-sided chest pain has subsided; she does have some residual anorexia (for the last 1 week) which has improved. She is currently not managed by the sickle cell clinic and refused to go there due to immigration issues. Dizziness which she complained of that last visit has resolved at this time. She also refuses a complete physical, Pap smear and mammogram due to the fact that she is yet to recertify for [facility] card.  Past Medical History:  Diagnosis Date  . Anemia   . Back pain, chronic   . Hypertension   . Sickle cell anemia (HCC)     Past Surgical History:  Procedure Laterality Date  . CESAREAN SECTION      Allergies  Allergen Reactions  . Oxycodone Itching  . Vicodin [Hydrocodone-Acetaminophen] Itching     Outpatient Medications Prior to Visit  Medication Sig Dispense Refill  . amLODipine (NORVASC) 5 MG tablet Take 1 tablet (5 mg total) by mouth daily. 30 tablet 5  . aspirin EC 81 MG tablet Take 81 mg by mouth daily.    . ferrous sulfate 325 (65 FE) MG tablet Take 1 tablet (325 mg total) by mouth daily. 30 tablet 0  . folic acid (FOLVITE) 1 MG tablet Take 2 tablets (2 mg total) by mouth daily. 60 tablet 0  . losartan  (COZAAR) 100 MG tablet Take 1 tablet (100 mg total) by mouth daily. 30 tablet 5  . omeprazole (PRILOSEC) 20 MG capsule Take 2 capsules (40 mg total) by mouth daily. 60 capsule 3  . traMADol (ULTRAM) 50 MG tablet Take 1 tablet (50 mg total) by mouth 3 (three) times daily. 90 tablet 2  . benzocaine (HURRICAINE) 20 % GEL Apply 4 times daily to aphthous ulcer until healed (Patient not taking: Reported on 09/24/2016) 30 g 1  . cetirizine (ZYRTEC) 10 MG tablet Take 1 tablet (10 mg total) by mouth daily. (Patient taking differently: Take 10 mg by mouth daily as needed for allergies. ) 30 tablet 5  . meclizine (ANTIVERT) 25 MG tablet Take 1 tablet (25 mg total) by mouth 2 (two) times daily as needed for dizziness. (Patient not taking: Reported on 09/24/2016) 40 tablet 2   No facility-administered medications prior to visit.     ROS Review of Systems  Constitutional: Positive for appetite change. Negative for activity change and fatigue.  HENT: Negative for congestion, sinus pressure and sore throat.   Eyes: Negative for visual disturbance.  Respiratory: Negative for cough, chest tightness, shortness of breath and wheezing.   Cardiovascular: Negative for chest pain and palpitations.  Gastrointestinal: Negative for abdominal distention, abdominal pain and constipation.  Endocrine: Negative for polydipsia.  Genitourinary: Negative for dysuria and frequency.  Musculoskeletal: Negative for  arthralgias and back pain.  Skin: Negative for rash.  Neurological: Negative for tremors, light-headedness and numbness.  Hematological: Does not bruise/bleed easily.  Psychiatric/Behavioral: Negative for agitation and behavioral problems.    Objective:  BP 117/81 (BP Location: Left Arm, Patient Position: Sitting, Cuff Size: Large)   Pulse 79   Temp 98.1 F (36.7 C) (Oral)   Resp 20   Wt 167 lb 9.6 oz (76 kg)   SpO2 100%   BMI 31.67 kg/m   BP/Weight 09/24/2016 09/22/2016 08/27/2016  Systolic BP 117 107 120    Diastolic BP 81 67 69  Wt. (Lbs) 167.6 170 171.2  BMI 31.67 32.12 29.39      Physical Exam  Constitutional: She is oriented to person, place, and time. She appears well-developed and well-nourished.  HENT:  Right Ear: External ear normal.  Left Ear: External ear normal.  Cardiovascular: Normal rate, normal heart sounds and intact distal pulses.   No murmur heard. Pulmonary/Chest: Effort normal and breath sounds normal. She has no wheezes. She has no rales. She exhibits no tenderness.  Abdominal: Soft. Bowel sounds are normal. She exhibits no distension and no mass. There is no tenderness.  Musculoskeletal: Normal range of motion.  Neurological: She is alert and oriented to person, place, and time.  Skin: Skin is warm and dry.  Psychiatric: She has a normal mood and affect.     CMP Latest Ref Rng & Units 09/22/2016 08/27/2016 04/13/2016  Glucose 65 - 99 mg/dL 161(W) 95 93  BUN 6 - 20 mg/dL 16 7 5(L)  Creatinine 9.60 - 1.00 mg/dL 4.54(U) 9.81 1.91  Sodium 135 - 145 mmol/L 138 141 139  Potassium 3.5 - 5.1 mmol/L 3.4(L) 4.4 3.8  Chloride 101 - 111 mmol/L 104 107 106  CO2 22 - 32 mmol/L 26 29 27   Calcium 8.9 - 10.3 mg/dL 4.7(W) 9.2 9.2  Total Protein 6.5 - 8.1 g/dL 7.1 7.1 -  Total Bilirubin 0.3 - 1.2 mg/dL 2.5(H) 1.3(H) -  Alkaline Phos 38 - 126 U/L 94 84 -  AST 15 - 41 U/L 120(H) 16 -  ALT 14 - 54 U/L 176(H) 18 -    CBC    Component Value Date/Time   WBC 4.9 09/22/2016 0913   RBC 3.43 (L) 09/22/2016 0913   RBC 3.43 (L) 09/22/2016 0913   HGB 8.5 (L) 09/22/2016 0913   HGB 10.2 (L) 11/21/2010 1336   HCT 23.8 (L) 09/22/2016 0913   HCT 29.2 (L) 11/21/2010 1336   PLT 116 (L) 09/22/2016 0913   PLT 149 11/21/2010 1336   MCV 69.4 (L) 09/22/2016 0913   MCV 76.3 (L) 11/21/2010 1336   MCH 24.8 (L) 09/22/2016 0913   MCHC 35.7 09/22/2016 0913   RDW 17.6 (H) 09/22/2016 0913   RDW 19.3 (H) 11/21/2010 1336   LYMPHSABS 1,280 08/27/2016 1028   LYMPHSABS 1.6 11/21/2010 1336    MONOABS 200 08/27/2016 1028   MONOABS 0.3 11/21/2010 1336   EOSABS 80 08/27/2016 1028   EOSABS 0.1 11/21/2010 1336   BASOSABS 40 08/27/2016 1028   BASOSABS 0.0 11/21/2010 1336     Assessment & Plan:   1. 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 Continue folic acid  2. Elevated liver enzymes Elevated bili could be due to hemolysis Will need to exclude infectious causes 2. - Hepatitis panel, acute  3. Anorexia Improving   No  orders of the defined types were placed in this encounter.   Follow-up: Return in about 3 weeks (around 10/15/2016) for Follow-up elevated liver enzymes and anemia.   Jaclyn Shaggy MD

## 2016-09-25 MED FILL — FOLIC ACID 1 MG TABLET: 1 | 30 days supply | Qty: 60 | Fill #0

## 2016-09-25 MED FILL — FERROUS SULFATE 325 MG TAB: 325 (65 FE) | 30 days supply | Qty: 30 | Fill #0

## 2016-10-15 ENCOUNTER — Encounter: Payer: Self-pay | Admitting: Family Medicine

## 2016-10-15 ENCOUNTER — Other Ambulatory Visit: Payer: Self-pay

## 2016-10-15 ENCOUNTER — Ambulatory Visit: Payer: Self-pay | Attending: Family Medicine | Admitting: Family Medicine

## 2016-10-15 VITALS — BP 139/85 | HR 76 | Temp 98.1°F | Wt 170.2 lb

## 2016-10-15 DIAGNOSIS — R945 Abnormal results of liver function studies: Secondary | ICD-10-CM

## 2016-10-15 DIAGNOSIS — D571 Sickle-cell disease without crisis: Secondary | ICD-10-CM

## 2016-10-15 DIAGNOSIS — R7989 Other specified abnormal findings of blood chemistry: Secondary | ICD-10-CM | POA: Insufficient documentation

## 2016-10-15 MED ORDER — CYCLOBENZAPRINE HCL 10 MG PO TABS: 10.0000 mg | ORAL_TABLET | Freq: Two times a day (BID) | ORAL | 1 refills | Status: DC | PRN

## 2016-10-15 MED FILL — CYCLOBENZAPRINE 10 MG TAB: 10 | 30 days supply | Qty: 60 | Fill #0

## 2016-10-15 NOTE — Progress Notes (Signed)
Subjective:  Patient ID: Kayla Woodard, female    DOB: 1955-11-03  Age: 61 y.o. MRN: [id number]  CC: Follow-up (elevated liver enzyme and anemia)   HPI Aaliya Maultsby  is a 61 year old female with a history of sickle cell anemia (previously followed at the sickle cell clinic), hypertension, GERD who comes into the clinic for a follow-up of LFTs. She had an ED visit last month where she was seen for left-sided pleuritic chest pains have since resolved. Labs revealed anemia with hemoglobin of 8.4, elevated LFTs with AST of 120, ALT of 176, Bili of 2.5,  She continues to experience some dizziness but has no acute concerns at this time.  Past Medical History:  Diagnosis Date  . Anemia   . Back pain, chronic   . Hypertension   . Sickle cell anemia (HCC)     Past Surgical History:  Procedure Laterality Date  . CESAREAN SECTION      Allergies  Allergen Reactions  . Oxycodone Itching  . Vicodin [Hydrocodone-Acetaminophen] Itching     Outpatient Medications Prior to Visit  Medication Sig Dispense Refill  . amLODipine (NORVASC) 5 MG tablet Take 1 tablet (5 mg total) by mouth daily. 30 tablet 5  . aspirin EC 81 MG tablet Take 81 mg by mouth daily.    . benzocaine (HURRICAINE) 20 % GEL Apply 4 times daily to aphthous ulcer until healed (Patient not taking: Reported on 09/24/2016) 30 g 1  . cetirizine (ZYRTEC) 10 MG tablet Take 1 tablet (10 mg total) by mouth daily. (Patient taking differently: Take 10 mg by mouth daily as needed for allergies. ) 30 tablet 5  . ferrous sulfate 325 (65 FE) MG tablet Take 1 tablet (325 mg total) by mouth daily. 30 tablet 0  . folic acid (FOLVITE) 1 MG tablet Take 2 tablets (2 mg total) by mouth daily. 60 tablet 0  . losartan (COZAAR) 100 MG tablet Take 1 tablet (100 mg total) by mouth daily. 30 tablet 5  . meclizine (ANTIVERT) 25 MG tablet Take 1 tablet (25 mg total) by mouth 2 (two) times daily as needed for dizziness. (Patient not taking: Reported on  09/24/2016) 40 tablet 2  . omeprazole (PRILOSEC) 20 MG capsule Take 2 capsules (40 mg total) by mouth daily. 60 capsule 3  . traMADol (ULTRAM) 50 MG tablet Take 1 tablet (50 mg total) by mouth 3 (three) times daily. 90 tablet 2   No facility-administered medications prior to visit.     ROS Review of Systems Constitutional: Negative for activity change and fatigue.  HENT: Negative for congestion, sinus pressure and sore throat.   Eyes: Negative for visual disturbance.  Respiratory: Negative for cough, chest tightness, shortness of breath and wheezing.   Cardiovascular: Negative for chest pain and palpitations.  Gastrointestinal: Negative for abdominal distention, abdominal pain and constipation.  Endocrine: Negative for polydipsia.  Genitourinary: Negative for dysuria and frequency.  Musculoskeletal: Negative for arthralgias and back pain.  Skin: Negative for rash.  Neurological: Negative for tremors, Positive for light-headedness and negative for numbness.  Hematological: Does not bruise/bleed easily.  Psychiatric/Behavioral: Negative for agitation and behavioral problems.   Objective:  BP 139/85 (BP Location: Right Arm, Patient Position: Sitting, Cuff Size: Normal)   Pulse 76   Temp 98.1 F (36.7 C) (Oral)   Wt 170 lb 3.2 oz (77.2 kg)   SpO2 100%   BMI 32.16 kg/m   BP/Weight 10/15/2016 09/24/2016 2/50/5397  Systolic BP 673 419 379  Diastolic BP 85  81 67  Wt. (Lbs) 170.2 167.6 170  BMI 32.16 31.67 32.12      Physical Exam Constitutional: She is oriented to person, place, and time. She appears well-developed and well-nourished.  HENT:  Right Ear: External ear normal.  Left Ear: External ear normal.  Cardiovascular: Normal rate, normal heart sounds and intact distal pulses.   No murmur heard. Pulmonary/Chest: Effort normal and breath sounds normal. She has no wheezes. She has no rales. She exhibits no tenderness.  Abdominal: Soft. Bowel sounds are normal. She exhibits no  distension and no mass. There is no tenderness.  Musculoskeletal: Normal range of motion.  Neurological: She is alert and oriented to person, place, and time.  Skin: Skin is warm and dry.  Psychiatric: She has a normal mood and affect.   Assessment & Plan:   1. Hb-SS disease without crisis ([place]) Continue Tramadol Not in acute crises - cyclobenzaprine (FLEXERIL) 10 MG tablet; Take 1 tablet (10 mg total) by mouth 2 (two) times daily as needed for muscle spasms.  Dispense: 60 tablet; Refill: 1  2. Elevated LFTs Previously elevated Repeat today - CMP14+EGFR   Meds ordered this encounter  Medications  . cyclobenzaprine (FLEXERIL) 10 MG tablet    Sig: Take 1 tablet (10 mg total) by mouth 2 (two) times daily as needed for muscle spasms.    Dispense:  60 tablet    Refill:  1    Follow-up: Return in about 3 months (around 01/14/2017) for Follow-up hypertension and sickle cell disease.   Arnoldo Morale MD

## 2016-10-16 ENCOUNTER — Telehealth: Payer: Self-pay | Admitting: *Deleted

## 2016-10-16 LAB — CMP14+EGFR
ALT: 10 IU/L (ref 0–32)
AST: 12 IU/L (ref 0–40)
Albumin/Globulin Ratio: 1.4 (ref 1.2–2.2)
Albumin: 4.1 g/dL (ref 3.5–5.5)
Alkaline Phosphatase: 101 IU/L (ref 39–117)
BILIRUBIN TOTAL: 0.9 mg/dL (ref 0.0–1.2)
BUN/Creatinine Ratio: 11 (ref 9–23)
BUN: 8 mg/dL (ref 6–24)
CHLORIDE: 104 mmol/L (ref 96–106)
CO2: 24 mmol/L (ref 18–29)
Calcium: 8.9 mg/dL (ref 8.7–10.2)
Creatinine, Ser: 0.76 mg/dL (ref 0.57–1.00)
GFR calc non Af Amer: 86 mL/min/{1.73_m2} (ref >59)
GFR, EST AFRICAN AMERICAN: 99 mL/min/{1.73_m2} (ref >59)
GLUCOSE: 97 mg/dL (ref 65–99)
Globulin, Total: 3 g/dL (ref 1.5–4.5)
Potassium: 3.6 mmol/L (ref 3.5–5.2)
Sodium: 141 mmol/L (ref 134–144)
TOTAL PROTEIN: 7.1 g/dL (ref 6.0–8.5)

## 2016-10-16 NOTE — Telephone Encounter (Signed)
-----   Message from Jaclyn Shaggy, MD sent at 10/16/2016  1:23 PM EDT ----- Labs are normal

## 2016-10-16 NOTE — Telephone Encounter (Signed)
Patient is aware of labs being normal. Medical Assistant left message on patient's home and cell voicemail. Voicemail states to give a call back to Mercia Dowe with [facility] at [phone].  

## 2016-10-26 MED FILL — ?MECLIZINE 25 MG TABLET: 25 | 20 days supply | Qty: 40 | Fill #2

## 2016-10-26 MED FILL — LOSARTAN POTASSIUM 100 MG T: 100 | 30 days supply | Qty: 30 | Fill #2

## 2016-10-26 MED FILL — OMEPRAZOLE DR 20 MG CAPSULE: 20 | 30 days supply | Qty: 60 | Fill #2

## 2016-10-26 MED FILL — traMADol HCL 50 MG TABS: 50 | 30 days supply | Qty: 90 | Fill #2

## 2016-10-26 MED FILL — ?AMLODIPINE BESYLATE 5 MG T: 5 | 30 days supply | Qty: 30 | Fill #2

## 2016-12-03 ENCOUNTER — Other Ambulatory Visit: Payer: Self-pay | Admitting: Family Medicine

## 2016-12-03 DIAGNOSIS — D571 Sickle-cell disease without crisis: Secondary | ICD-10-CM

## 2016-12-03 MED FILL — LOSARTAN POTASSIUM 100 MG T: 100 | 30 days supply | Qty: 30 | Fill #3

## 2016-12-03 NOTE — Telephone Encounter (Signed)
Patient called requesting medication refill on traMADol (ULTRAM) 50 MG tablet, please f/up °

## 2016-12-05 ENCOUNTER — Other Ambulatory Visit: Payer: Self-pay | Admitting: Family Medicine

## 2016-12-05 MED FILL — traMADol HCL 50 MG TABS: 50 | 30 days supply | Qty: 90 | Fill #0

## 2016-12-05 MED FILL — ?OMEPRAZOLE DR 20 MG CAPSUL: 20 | 30 days supply | Qty: 60 | Fill #3

## 2016-12-05 MED FILL — ?AMLODIPINE BESYLATE 5 MG T: 5 | 30 days supply | Qty: 30 | Fill #3

## 2017-01-04 ENCOUNTER — Telehealth: Payer: Self-pay | Admitting: Family Medicine

## 2017-01-04 DIAGNOSIS — I10 Essential (primary) hypertension: Secondary | ICD-10-CM

## 2017-01-04 MED ORDER — AMLODIPINE BESYLATE 5 MG PO TABS: 5.0000 mg | ORAL_TABLET | Freq: Every day | ORAL | 2 refills | Status: DC

## 2017-01-04 MED ORDER — OMEPRAZOLE 20 MG PO CPDR: 40.0000 mg | DELAYED_RELEASE_CAPSULE | Freq: Every day | ORAL | 2 refills | Status: DC

## 2017-01-04 MED ORDER — LOSARTAN POTASSIUM 100 MG PO TABS: 100.0000 mg | ORAL_TABLET | Freq: Every day | ORAL | 2 refills | Status: DC

## 2017-01-04 MED ORDER — CETIRIZINE HCL 10 MG PO TABS: 10.0000 mg | ORAL_TABLET | Freq: Every day | ORAL | 2 refills | Status: DC

## 2017-01-04 MED FILL — LOSARTAN POTASSIUM 100 MG T: 100 | 30 days supply | Qty: 30 | Fill #4

## 2017-01-04 MED FILL — traMADol HCL 50 MG TABS: 50 | 30 days supply | Qty: 90 | Fill #1

## 2017-01-04 MED FILL — AMLODIPINE BESYLATE 5 MG TA: 5 | 30 days supply | Qty: 30 | Fill #4

## 2017-01-04 MED FILL — ?OMEPRAZOLE DR 20 MG CAPSUL: 20 | 30 days supply | Qty: 60 | Fill #1

## 2017-01-04 NOTE — Telephone Encounter (Signed)
Disregard patient has refills

## 2017-01-04 NOTE — Telephone Encounter (Signed)
Patient is requesting medication refill for all medications including tramadol

## 2017-01-04 NOTE — Telephone Encounter (Signed)
Reordered chronic, non-pain medications. Will defer tramadol and cyclobenzaprine to Dr. Venetia NightAmao.

## 2017-01-28 ENCOUNTER — Ambulatory Visit: Payer: Self-pay | Admitting: Family Medicine

## 2017-01-31 MED FILL — ?OMEPRAZOLE DR 20 MG CAPSUL: 20 | 30 days supply | Qty: 60 | Fill #2

## 2017-01-31 MED FILL — LOSARTAN POTASSIUM 100 MG T: 100 | 30 days supply | Qty: 30 | Fill #5

## 2017-01-31 MED FILL — traMADol HCL 50 MG TABS: 50 | 30 days supply | Qty: 90 | Fill #2

## 2017-01-31 MED FILL — AMLODIPINE BESYLATE 5 MG TA: 5 | 30 days supply | Qty: 30 | Fill #5

## 2017-02-04 ENCOUNTER — Ambulatory Visit: Payer: Self-pay | Attending: Family Medicine | Admitting: Family Medicine

## 2017-02-04 ENCOUNTER — Encounter: Payer: Self-pay | Admitting: Family Medicine

## 2017-02-04 VITALS — BP 113/79 | HR 71 | Temp 98.2°F | Resp 18 | Ht 61.0 in | Wt 171.0 lb

## 2017-02-04 DIAGNOSIS — K219 Gastro-esophageal reflux disease without esophagitis: Secondary | ICD-10-CM | POA: Insufficient documentation

## 2017-02-04 DIAGNOSIS — R400 Somnolence: Secondary | ICD-10-CM

## 2017-02-04 DIAGNOSIS — R5383 Other fatigue: Secondary | ICD-10-CM

## 2017-02-04 DIAGNOSIS — G8929 Other chronic pain: Secondary | ICD-10-CM | POA: Insufficient documentation

## 2017-02-04 DIAGNOSIS — Z9889 Other specified postprocedural states: Secondary | ICD-10-CM | POA: Insufficient documentation

## 2017-02-04 DIAGNOSIS — Z7982 Long term (current) use of aspirin: Secondary | ICD-10-CM | POA: Insufficient documentation

## 2017-02-04 DIAGNOSIS — D571 Sickle-cell disease without crisis: Secondary | ICD-10-CM

## 2017-02-04 DIAGNOSIS — Z885 Allergy status to narcotic agent status: Secondary | ICD-10-CM | POA: Insufficient documentation

## 2017-02-04 DIAGNOSIS — I10 Essential (primary) hypertension: Secondary | ICD-10-CM

## 2017-02-04 DIAGNOSIS — M549 Dorsalgia, unspecified: Secondary | ICD-10-CM | POA: Insufficient documentation

## 2017-02-04 MED ORDER — OMEPRAZOLE 20 MG PO CPDR: 20.0000 mg | DELAYED_RELEASE_CAPSULE | Freq: Every day | ORAL | 2 refills | Status: DC

## 2017-02-04 MED ORDER — TRAMADOL HCL 50 MG PO TABS: 50.0000 mg | ORAL_TABLET | Freq: Three times a day (TID) | ORAL | 2 refills | Status: DC

## 2017-02-04 MED ORDER — FOLIC ACID 1 MG PO TABS: 2.0000 mg | ORAL_TABLET | Freq: Every day | ORAL | 3 refills | Status: DC

## 2017-02-04 MED ORDER — LOSARTAN POTASSIUM 100 MG PO TABS: 100.0000 mg | ORAL_TABLET | Freq: Every day | ORAL | 2 refills | Status: DC

## 2017-02-04 MED ORDER — CYCLOBENZAPRINE HCL 10 MG PO TABS: 10.0000 mg | ORAL_TABLET | Freq: Two times a day (BID) | ORAL | 2 refills | Status: DC | PRN

## 2017-02-04 MED ORDER — AMLODIPINE BESYLATE 5 MG PO TABS: 5.0000 mg | ORAL_TABLET | Freq: Every day | ORAL | 2 refills | Status: DC

## 2017-02-04 MED FILL — FOLIC ACID 1 MG TABLET: 1 | 30 days supply | Qty: 60 | Fill #0

## 2017-02-04 MED FILL — ?CYCLOBENZAPRINE 10 MG TABL: 10 | 30 days supply | Qty: 60 | Fill #0

## 2017-02-04 NOTE — Progress Notes (Signed)
Subjective:  Patient ID: Kayla Woodard, female    DOB: 1956-02-14  Age: 61 y.o. MRN: [id number]  CC: Hypertension   HPI Kayla Woodard is a 61 year old female with a history of sickle cell anemia (previously followed at the sickle cell clinic), hypertension, GERD who presents today for follow-up visit  She denies any recent sickle cell crisis and remains chronically on tramadol. She does not have any bone pains or joint pains that are out of the ordinary today. Works in a [employer] and gets to be off on Sundays and Mondays.  Tolerating her antihypertensive and denies side effects; reflux symptoms are controlled on omeprazole.  She complains of fatigue and thinks "her blood is down"; she would like it tested today complaints of excessive somnolence and feeling like sleeping all day. Meclizine appears on her med list which she was placed on for vertigo however she denies taking it.  Past Medical History:  Diagnosis Date  . Anemia   . Back pain, chronic   . Hypertension   . Sickle cell anemia (HCC)     Past Surgical History:  Procedure Laterality Date  . CESAREAN SECTION      Allergies  Allergen Reactions  . Oxycodone Itching  . Vicodin [Hydrocodone-Acetaminophen] Itching    Outpatient Medications Prior to Visit  Medication Sig Dispense Refill  . aspirin EC 81 MG tablet Take 81 mg by mouth daily.    . cetirizine (ZYRTEC) 10 MG tablet Take 1 tablet (10 mg total) by mouth daily. 30 tablet 2  . ferrous sulfate 325 (65 FE) MG tablet Take 1 tablet (325 mg total) by mouth daily. 30 tablet 0  . meclizine (ANTIVERT) 25 MG tablet Take 1 tablet (25 mg total) by mouth 2 (two) times daily as needed for dizziness. 40 tablet 2  . amLODipine (NORVASC) 5 MG tablet Take 1 tablet (5 mg total) by mouth daily. 30 tablet 2  . cyclobenzaprine (FLEXERIL) 10 MG tablet Take 1 tablet (10 mg total) by mouth 2 (two) times daily as needed for muscle spasms. 60 tablet 1  . folic acid  (FOLVITE) 1 MG tablet Take 2 tablets (2 mg total) by mouth daily. 60 tablet 0  . losartan (COZAAR) 100 MG tablet Take 1 tablet (100 mg total) by mouth daily. 30 tablet 2  . omeprazole (PRILOSEC) 20 MG capsule Take 2 capsules (40 mg total) by mouth daily. 60 capsule 2  . traMADol (ULTRAM) 50 MG tablet TAKE ONE TABLET BY MOUTH THREE TIMES DAILY 90 tablet 2  . benzocaine (HURRICAINE) 20 % GEL Apply 4 times daily to aphthous ulcer until healed (Patient not taking: Reported on 09/24/2016) 30 g 1   No facility-administered medications prior to visit.     ROS Review of Systems  Constitutional: Positive for fatigue. Negative for activity change and appetite change.  HENT: Negative for congestion, sinus pressure and sore throat.   Eyes: Negative for visual disturbance.  Respiratory: Negative for cough, chest tightness, shortness of breath and wheezing.   Cardiovascular: Negative for chest pain and palpitations.  Gastrointestinal: Negative for abdominal distention, abdominal pain and constipation.  Endocrine: Negative for polydipsia.  Genitourinary: Negative for dysuria and frequency.  Musculoskeletal: Negative for arthralgias and back pain.  Skin: Negative for rash.  Neurological: Negative for tremors, light-headedness and numbness.  Hematological: Does not bruise/bleed easily.  Psychiatric/Behavioral: Positive for sleep disturbance. Negative for agitation and behavioral problems.    Objective:  BP 113/79 (BP Location: Right Arm, Patient Position: Sitting,  Cuff Size: Large)   Pulse 71   Temp 98.2 F (36.8 C) (Oral)   Resp 18   Ht '5\' 1"'  (1.549 m)   Wt 171 lb (77.6 kg)   SpO2 99%   BMI 32.31 kg/m   BP/Weight 02/04/2017 10/15/2016 12/21/2977  Systolic BP 892 119 417  Diastolic BP 79 85 81  Wt. (Lbs) 171 170.2 167.6  BMI 32.31 32.16 31.67      Physical Exam Constitutional: She is oriented to person, place, and time. She appears well-developed and well-nourished.  HENT:  Right Ear:  External ear normal.  Left Ear: External ear normal.  Cardiovascular: Normal rate, normal heart sounds and intact distal pulses.   No murmur heard. Pulmonary/Chest: Effort normal and breath sounds normal. She has no wheezes. She has no rales. She exhibits no tenderness.  Abdominal: Soft. Bowel sounds are normal. She exhibits no distension and no mass. There is no tenderness.  Musculoskeletal: Normal range of motion.  Neurological: She is alert and oriented to person, place, and time.  Skin: Skin is warm and dry.  Psychiatric: She has a normal mood and affect.   Assessment & Plan:   1. Essential hypertension Controlled - amLODipine (NORVASC) 5 MG tablet; Take 1 tablet (5 mg total) by mouth daily.  Dispense: 30 tablet; Refill: 2 - losartan (COZAAR) 100 MG tablet; Take 1 tablet (100 mg total) by mouth daily.  Dispense: 30 tablet; Refill: 2 - CMP14+EGFR  2. Hb-SS disease without crisis ([place]) Is not in crisis at this time - traMADol (ULTRAM) 50 MG tablet; Take 1 tablet (50 mg total) by mouth 3 (three) times daily.  Dispense: 90 tablet; Refill: 2 - cyclobenzaprine (FLEXERIL) 10 MG tablet; Take 1 tablet (10 mg total) by mouth 2 (two) times daily as needed for muscle spasms.  Dispense: 60 tablet; Refill: 2 - CBC with Differential/Platelet  3. Other fatigue We'll need to exclude thyroid versus vitamin D deficiency as etiology Anemia could also explain this - TSH - Vitamin D, 25-hydroxy  4. Somnolence Currently not taking any sedating medications Could be secondary to fatigue.   Meds ordered this encounter  Medications  . amLODipine (NORVASC) 5 MG tablet    Sig: Take 1 tablet (5 mg total) by mouth daily.    Dispense:  30 tablet    Refill:  2  . omeprazole (PRILOSEC) 20 MG capsule    Sig: Take 1 capsule (20 mg total) by mouth daily.    Dispense:  30 capsule    Refill:  2  . traMADol (ULTRAM) 50 MG tablet    Sig: Take 1 tablet (50 mg total) by mouth 3 (three) times daily.     Dispense:  90 tablet    Refill:  2  . cyclobenzaprine (FLEXERIL) 10 MG tablet    Sig: Take 1 tablet (10 mg total) by mouth 2 (two) times daily as needed for muscle spasms.    Dispense:  60 tablet    Refill:  2  . losartan (COZAAR) 100 MG tablet    Sig: Take 1 tablet (100 mg total) by mouth daily.    Dispense:  30 tablet    Refill:  2  . folic acid (FOLVITE) 1 MG tablet    Sig: Take 2 tablets (2 mg total) by mouth daily.    Dispense:  60 tablet    Refill:  3    Follow-up: Return in about 3 months (around 05/07/2017) for Follow-up of chronic medical conditions.   This note  has been created with [employer], quantity. Any transcriptional errors are unintentional.     Arnoldo Morale MD

## 2017-02-05 LAB — CMP14+EGFR
ALK PHOS: 107 IU/L (ref 39–117)
ALT: 21 IU/L (ref 0–32)
AST: 22 IU/L (ref 0–40)
Albumin/Globulin Ratio: 1.7 (ref 1.2–2.2)
Albumin: 4.5 g/dL (ref 3.5–5.5)
BILIRUBIN TOTAL: 1.2 mg/dL (ref 0.0–1.2)
BUN / CREAT RATIO: 14 (ref 9–23)
BUN: 11 mg/dL (ref 6–24)
CHLORIDE: 105 mmol/L (ref 96–106)
CO2: 24 mmol/L (ref 20–29)
CREATININE: 0.81 mg/dL (ref 0.57–1.00)
Calcium: 9.3 mg/dL (ref 8.7–10.2)
GFR calc Af Amer: 92 mL/min/{1.73_m2} (ref >59)
GFR calc non Af Amer: 80 mL/min/{1.73_m2} (ref >59)
GLUCOSE: 89 mg/dL (ref 65–99)
Globulin, Total: 2.7 g/dL (ref 1.5–4.5)
Potassium: 4.4 mmol/L (ref 3.5–5.2)
Sodium: 140 mmol/L (ref 134–144)
Total Protein: 7.2 g/dL (ref 6.0–8.5)

## 2017-02-05 LAB — CBC WITH DIFFERENTIAL/PLATELET
BASOS ABS: 0 10*3/uL (ref 0.0–0.2)
Basos: 0 %
EOS (ABSOLUTE): 0.1 10*3/uL (ref 0.0–0.4)
Eos: 2 %
HEMOGLOBIN: 10.6 g/dL — AB (ref 11.1–15.9)
Hematocrit: 30.5 % — ABNORMAL LOW (ref 34.0–46.6)
Immature Grans (Abs): 0 10*3/uL (ref 0.0–0.1)
Immature Granulocytes: 0 %
LYMPHS ABS: 1.5 10*3/uL (ref 0.7–3.1)
Lymphs: 36 %
MCH: 25.5 pg — AB (ref 26.6–33.0)
MCHC: 34.8 g/dL (ref 31.5–35.7)
MCV: 74 fL — ABNORMAL LOW (ref 79–97)
MONOCYTES: 5 %
MONOS ABS: 0.2 10*3/uL (ref 0.1–0.9)
NEUTROS ABS: 2.5 10*3/uL (ref 1.4–7.0)
NEUTROS PCT: 57 %
PLATELETS: 167 10*3/uL (ref 150–379)
RBC: 4.15 x10E6/uL (ref 3.77–5.28)
RDW: 18.2 % — AB (ref 12.3–15.4)
WBC: 4.3 10*3/uL (ref 3.4–10.8)

## 2017-02-05 LAB — VITAMIN D 25 HYDROXY (VIT D DEFICIENCY, FRACTURES): Vit D, 25-Hydroxy: 21.1 ng/mL — ABNORMAL LOW (ref 30.0–100.0)

## 2017-02-05 LAB — TSH: TSH: 1.98 u[IU]/mL (ref 0.450–4.500)

## 2017-02-06 ENCOUNTER — Other Ambulatory Visit: Payer: Self-pay | Admitting: Family Medicine

## 2017-02-06 MED ORDER — ERGOCALCIFEROL 1.25 MG (50000 UT) PO CAPS: 50000.0000 [IU] | ORAL_CAPSULE | ORAL | 1 refills | Status: AC

## 2017-02-06 MED FILL — VIT D2 1.25 MG (50,000 UNIT: 1.25 MG | 28 days supply | Qty: 4 | Fill #0

## 2017-02-11 ENCOUNTER — Telehealth: Payer: Self-pay

## 2017-02-11 NOTE — Telephone Encounter (Signed)
Pt was called and informed of lab results. 

## 2017-03-11 MED FILL — LOSARTAN POTASSIUM 100 MG T: 100 | 30 days supply | Qty: 30 | Fill #0

## 2017-03-11 MED FILL — ?CYCLOBENZAPRINE 10 MG TABL: 10 | 30 days supply | Qty: 60 | Fill #1

## 2017-03-11 MED FILL — FOLIC ACID 1 MG TABLET: 1 | 30 days supply | Qty: 60 | Fill #1

## 2017-03-11 MED FILL — traMADol HCL 50 MG TABS: 50 | 30 days supply | Qty: 90 | Fill #0

## 2017-03-11 MED FILL — VIT D2 1.25 MG (50,000 UNIT: 1.25 MG | 28 days supply | Qty: 4 | Fill #1

## 2017-03-11 MED FILL — ?OMEPRAZOLE DR 20 MG CAPSUL: 20 | 30 days supply | Qty: 30 | Fill #0

## 2017-04-11 MED FILL — ?OMEPRAZOLE DR 20 MG CAPSUL: 20 | 30 days supply | Qty: 30 | Fill #1

## 2017-04-11 MED FILL — LOSARTAN POTASSIUM 100 MG T: 100 | 30 days supply | Qty: 30 | Fill #1

## 2017-04-11 MED FILL — FOLIC ACID 1 MG TABLET: 1 | 30 days supply | Qty: 60 | Fill #2

## 2017-04-11 MED FILL — ?CYCLOBENZAPRINE 10 MG TABL: 10 | 30 days supply | Qty: 60 | Fill #2

## 2017-04-11 MED FILL — VIT D2 1.25 MG (50,000 UNIT: 1.25 MG | 28 days supply | Qty: 4 | Fill #2

## 2017-04-11 MED FILL — traMADol HCL 50 MG TABS: 50 | 30 days supply | Qty: 90 | Fill #1

## 2017-04-30 ENCOUNTER — Encounter (HOSPITAL_COMMUNITY): Payer: Self-pay | Admitting: *Deleted

## 2017-04-30 ENCOUNTER — Emergency Department (HOSPITAL_COMMUNITY)
Admission: EM | Admit: 2017-04-30 | Discharge: 2017-04-30 | Disposition: A | Discharge status: Home / Self Care | Payer: Self-pay | Attending: Emergency Medicine | Admitting: Emergency Medicine

## 2017-04-30 ENCOUNTER — Emergency Department (HOSPITAL_COMMUNITY): Payer: Self-pay

## 2017-04-30 DIAGNOSIS — Z5321 Procedure and treatment not carried out due to patient leaving prior to being seen by health care provider: Secondary | ICD-10-CM | POA: Insufficient documentation

## 2017-04-30 DIAGNOSIS — M549 Dorsalgia, unspecified: Secondary | ICD-10-CM | POA: Insufficient documentation

## 2017-04-30 DIAGNOSIS — R05 Cough: Secondary | ICD-10-CM | POA: Insufficient documentation

## 2017-04-30 LAB — COMPREHENSIVE METABOLIC PANEL
ALK PHOS: 90 U/L (ref 38–126)
ALT: 14 U/L (ref 14–54)
ANION GAP: 6 (ref 5–15)
AST: 17 U/L (ref 15–41)
Albumin: 3.9 g/dL (ref 3.5–5.0)
BILIRUBIN TOTAL: 1.2 mg/dL (ref 0.3–1.2)
BUN: 8 mg/dL (ref 6–20)
CALCIUM: 8.9 mg/dL (ref 8.9–10.3)
CO2: 26 mmol/L (ref 22–32)
Chloride: 107 mmol/L (ref 101–111)
Creatinine, Ser: 0.79 mg/dL (ref 0.44–1.00)
GFR calc non Af Amer: >60 mL/min (ref >60)
Glucose, Bld: 121 mg/dL — ABNORMAL HIGH (ref 65–99)
Potassium: 4 mmol/L (ref 3.5–5.1)
Sodium: 139 mmol/L (ref 135–145)
TOTAL PROTEIN: 7.3 g/dL (ref 6.5–8.1)

## 2017-04-30 LAB — CBC WITH DIFFERENTIAL/PLATELET
BASOS ABS: 0 10*3/uL (ref 0.0–0.1)
Basophils Relative: 0 %
EOS ABS: 0 10*3/uL (ref 0.0–0.7)
Eosinophils Relative: 1 %
HCT: 29.9 % — ABNORMAL LOW (ref 36.0–46.0)
HEMOGLOBIN: 10.8 g/dL — AB (ref 12.0–15.0)
Lymphocytes Relative: 31 %
Lymphs Abs: 1.4 10*3/uL (ref 0.7–4.0)
MCH: 24.9 pg — ABNORMAL LOW (ref 26.0–34.0)
MCHC: 36.1 g/dL — ABNORMAL HIGH (ref 30.0–36.0)
MCV: 69.1 fL — ABNORMAL LOW (ref 78.0–100.0)
Monocytes Absolute: 0.2 10*3/uL (ref 0.1–1.0)
Monocytes Relative: 5 %
NEUTROS PCT: 63 %
Neutro Abs: 3 10*3/uL (ref 1.7–7.7)
Platelets: 145 10*3/uL — ABNORMAL LOW (ref 150–400)
RBC: 4.33 MIL/uL (ref 3.87–5.11)
RDW: 18.2 % — ABNORMAL HIGH (ref 11.5–15.5)
WBC: 4.6 10*3/uL (ref 4.0–10.5)

## 2017-04-30 LAB — RETICULOCYTES
RBC.: 4.33 MIL/uL (ref 3.87–5.11)
RETIC CT PCT: 1.9 % (ref 0.4–3.1)
Retic Count, Absolute: 82.3 10*3/uL (ref 19.0–186.0)

## 2017-04-30 NOTE — ED Triage Notes (Signed)
Pt reports not feeling well for several days, hx of sickle cell. Having productive cough and mid back pain. Denies fever. No acute distress is noted at triage.

## 2017-04-30 NOTE — ED Notes (Signed)
Pt stated she wants to leave. Pt encouraged to stay, but refused. Pt asked about getting vitals reassessed, but refused. Pt stated that she will come back tomorrow.

## 2017-05-20 MED FILL — LOSARTAN POTASSIUM 100 MG T: 100 | 30 days supply | Qty: 30 | Fill #2

## 2017-05-20 MED FILL — ?CETIRIZINE HCL 10 MG TABLE: 10 | 30 days supply | Qty: 30 | Fill #0

## 2017-05-20 MED FILL — traMADol HCL 50 MG TABS: 50 | 30 days supply | Qty: 90 | Fill #2

## 2017-05-20 MED FILL — ?OMEPRAZOLE DR 20 MG CAPSUL: 20 | 30 days supply | Qty: 30 | Fill #2

## 2017-05-22 ENCOUNTER — Encounter: Payer: Self-pay | Admitting: Physician Assistant

## 2017-05-22 ENCOUNTER — Other Ambulatory Visit: Payer: Self-pay

## 2017-05-22 ENCOUNTER — Ambulatory Visit: Payer: Self-pay | Attending: Family Medicine | Admitting: Physician Assistant

## 2017-05-22 VITALS — BP 144/89 | HR 73 | Temp 97.4°F | Resp 16 | Ht 64.0 in | Wt 172.6 lb

## 2017-05-22 DIAGNOSIS — G8929 Other chronic pain: Secondary | ICD-10-CM | POA: Insufficient documentation

## 2017-05-22 DIAGNOSIS — I10 Essential (primary) hypertension: Secondary | ICD-10-CM

## 2017-05-22 DIAGNOSIS — Z79891 Long term (current) use of opiate analgesic: Secondary | ICD-10-CM | POA: Insufficient documentation

## 2017-05-22 DIAGNOSIS — K219 Gastro-esophageal reflux disease without esophagitis: Secondary | ICD-10-CM | POA: Insufficient documentation

## 2017-05-22 DIAGNOSIS — Z7982 Long term (current) use of aspirin: Secondary | ICD-10-CM | POA: Insufficient documentation

## 2017-05-22 DIAGNOSIS — Z79899 Other long term (current) drug therapy: Secondary | ICD-10-CM | POA: Insufficient documentation

## 2017-05-22 DIAGNOSIS — J40 Bronchitis, not specified as acute or chronic: Secondary | ICD-10-CM

## 2017-05-22 DIAGNOSIS — Z885 Allergy status to narcotic agent status: Secondary | ICD-10-CM | POA: Insufficient documentation

## 2017-05-22 MED ORDER — AZITHROMYCIN 250 MG PO TABS: ORAL_TABLET | ORAL | 0 refills | Status: DC

## 2017-05-22 MED ORDER — BENZONATATE 100 MG PO CAPS: 100.0000 mg | ORAL_CAPSULE | Freq: Three times a day (TID) | ORAL | 0 refills | Status: DC | PRN

## 2017-05-22 MED FILL — AZITHROMYCIN 250 MG TABLET: 250 | 5 days supply | Qty: 6 | Fill #0

## 2017-05-22 MED FILL — BENZONATATE 100 MG CAPSULE: 100 | 10 days supply | Qty: 30 | Fill #0

## 2017-05-22 NOTE — Progress Notes (Signed)
Cough for 10 weeks now. No relief with OTC meds.

## 2017-05-22 NOTE — Progress Notes (Signed)
Patient ID: Kayla Woodard, female   DOB: [birth year], 61 y.o.   MRN: [id number] t     Kayla Woodard, is a 61 y.o. female  [id number]  [id number]  DOB - [birth year]  Subjective:  Chief Complaint and HPI: Kayla Woodard is a 61 y.o. female here today for about 10 weeks.  She was triaged in the ED on 04/30/2017 and a CXR was negative.  She did not wait to be seen.  The cough is worse at night and is productive of yellow mucus.  No hemoptysis.  No recent travel.  No exposure to TB.  No f/c.  No night sweats or weight loss.  Reflux is stable.    ED/Hospital notes reviewed.    ROS:   Constitutional:  No f/c, No night sweats, No unexplained weight loss. EENT:  No vision changes, No blurry vision, No hearing changes. No mouth, throat, or ear problems.  Respiratory: + cough, No SOB Cardiac: No CP, no palpitations GI:  No abd pain, No N/V/D. GU: No Urinary s/sx Musculoskeletal: No joint pain Neuro: No headache, no dizziness, no motor weakness.  Skin: No rash Endocrine:  No polydipsia. No polyuria.  Psych: Denies SI/HI  No problems updated.  ALLERGIES: Allergies  Allergen Reactions  . Oxycodone Itching  . Vicodin [Hydrocodone-Acetaminophen] Itching    PAST MEDICAL HISTORY: Past Medical History:  Diagnosis Date  . Anemia   . Back pain, chronic   . Hypertension   . Sickle cell anemia (HCC)     MEDICATIONS AT HOME: Prior to Admission medications   Medication Sig Start Date End Date Taking? Authorizing Provider  amLODipine (NORVASC) 5 MG tablet Take 1 tablet (5 mg total) by mouth daily. 02/04/17  Yes Jaclyn ShaggyAmao, Enobong, MD  cetirizine (ZYRTEC) 10 MG tablet Take 1 tablet (10 mg total) by mouth daily. 01/04/17  Yes Jaclyn ShaggyAmao, Enobong, MD  ergocalciferol (DRISDOL) 50000 units capsule Take 1 capsule (50,000 Units total) by mouth once a week. 02/06/17  Yes Jaclyn ShaggyAmao, Enobong, MD  folic acid (FOLVITE) 1 MG tablet Take 2 tablets (2 mg total) by mouth daily. 02/04/17  Yes Jaclyn ShaggyAmao, Enobong, MD  losartan  (COZAAR) 100 MG tablet Take 1 tablet (100 mg total) by mouth daily. 02/04/17  Yes Jaclyn ShaggyAmao, Enobong, MD  omeprazole (PRILOSEC) 20 MG capsule Take 1 capsule (20 mg total) by mouth daily. 02/04/17  Yes Jaclyn ShaggyAmao, Enobong, MD  traMADol (ULTRAM) 50 MG tablet Take 1 tablet (50 mg total) by mouth 3 (three) times daily. 02/04/17  Yes Jaclyn ShaggyAmao, Enobong, MD  aspirin EC 81 MG tablet Take 81 mg by mouth daily.    [provider]  azithromycin (ZITHROMAX) 250 MG tablet Take 2 today then 1 daily 05/22/17   Georgian CoMcClung, Angela M, PA-C  benzocaine (HURRICAINE) 20 % GEL Apply 4 times daily to aphthous ulcer until healed Patient not taking: Reported on 09/24/2016 08/27/16   Jaclyn ShaggyAmao, Enobong, MD  benzonatate (TESSALON) 100 MG capsule Take 1 capsule (100 mg total) by mouth 3 (three) times daily as needed for cough. 05/22/17   Anders SimmondsMcClung, Angela M, PA-C  cyclobenzaprine (FLEXERIL) 10 MG tablet Take 1 tablet (10 mg total) by mouth 2 (two) times daily as needed for muscle spasms. Patient not taking: Reported on 05/22/2017 02/04/17   Jaclyn ShaggyAmao, Enobong, MD  ferrous sulfate 325 (65 FE) MG tablet Take 1 tablet (325 mg total) by mouth daily. Patient not taking: Reported on 05/22/2017 09/22/16   Emi HolesLaw, Alexandra M, PA-C  meclizine (ANTIVERT) 25 MG tablet Take 1 tablet (25 mg  total) by mouth 2 (two) times daily as needed for dizziness. Patient not taking: Reported on 05/22/2017 08/27/16   Jaclyn ShaggyAmao, Enobong, MD     Objective:  EXAM:   Vitals:   05/22/17 0907  BP: (!) 144/89  Pulse: 73  Resp: 16  Temp: (!) 97.4 F (36.3 C)  TempSrc: Oral  SpO2: 100%  Weight: 172 lb 9.6 oz (78.3 kg)  Height: 5\' 4"  (1.626 m)    General appearance : A&OX3. NAD. Non-toxic-appearing HEENT: Atraumatic and Normocephalic.  PERRLA. EOM intact.  TM clear B. Mouth-MMM, post pharynx WNL w/o erythema, No PND. Neck: supple, no JVD. No cervical lymphadenopathy. No thyromegaly Chest/Lungs:  Breathing-non-labored, Good air entry bilaterally, breath sounds normal without rales,  rhonchi, or wheezing  CVS: S1 S2 regular, no murmurs, gallops, rubs  Extremities: Bilateral Lower Ext shows no edema, both legs are warm to touch with = pulse throughout Neurology:  CN II-XII grossly intact, Non focal.   Psych:  TP linear. J/I WNL. Normal speech. Appropriate eye contact and affect.  Skin:  No Rash  Data Review No results found for: HGBA1C   Assessment & Plan   1. Bronchitis Cover for atypicals due to length of illness.  Neg CXR 04/30/2017 - azithromycin (ZITHROMAX) 250 MG tablet; Take 2 today then 1 daily  Dispense: 6 tablet; Refill: 0 - benzonatate (TESSALON) 100 MG capsule; Take 1 capsule (100 mg total) by mouth 3 (three) times daily as needed for cough.  Dispense: 30 capsule; Refill: 0  2. Essential hypertension, benign Uncontrolled but hasnt taken meds today.  Check BP OOO. We have discussed target BP range and blood pressure goal. I have advised patient to check BP regularly and to call us back or report to clinic if the numbers are consistently higher than 140/90. We discussed the importance of compliance with medical therapy and DASH diet recommended, consequences of uncontrolled hypertension discussed.  Continue with current regimen for now and f/up with PCP    Patient have been counseled extensively about nutrition and exercise  Return in about 3 months (around 08/22/2017) for Dr Peggye LeyNewlin-3 month f/up.  The patient was given clear instructions to go to ER or return to medical center if symptoms don't improve, worsen or new problems develop. The patient verbalized understanding. The patient was told to call to get lab results if they haven't heard anything in the next week.     Georgian CoAngela McClung, PA-C [facility] [place], [place] [phone]   05/22/2017, 9:31 AM

## 2017-07-04 ENCOUNTER — Other Ambulatory Visit: Payer: Self-pay | Admitting: Physician Assistant

## 2017-07-04 ENCOUNTER — Other Ambulatory Visit: Payer: Self-pay | Admitting: Family Medicine

## 2017-07-04 DIAGNOSIS — J40 Bronchitis, not specified as acute or chronic: Secondary | ICD-10-CM

## 2017-07-04 DIAGNOSIS — D571 Sickle-cell disease without crisis: Secondary | ICD-10-CM

## 2017-07-04 DIAGNOSIS — I10 Essential (primary) hypertension: Secondary | ICD-10-CM

## 2017-07-04 MED FILL — ?CETIRIZINE HCL 10 MG TABLE: 10 | 30 days supply | Qty: 30 | Fill #1

## 2017-07-04 MED FILL — LOSARTAN POTASSIUM 100 MG T: 100 | 30 days supply | Qty: 30 | Fill #0

## 2017-07-04 MED FILL — ?OMEPRAZOLE DR 20MG CAPSULE: 20 | 30 days supply | Qty: 30 | Fill #0

## 2017-07-05 ENCOUNTER — Telehealth: Payer: Self-pay

## 2017-07-05 MED FILL — BENZONATATE 100 MG CAPSULE: 100 | 10 days supply | Qty: 30 | Fill #0

## 2017-07-05 NOTE — Telephone Encounter (Signed)
Pt was called and informed of script being ready for pick up.

## 2017-07-08 MED FILL — traMADol HCL 50 MG TABS: 50 | 30 days supply | Qty: 90 | Fill #0

## 2017-08-05 MED FILL — ?OMEPRAZOLE 20 MG CPDR: 20 | 30 days supply | Qty: 30 | Fill #1

## 2017-08-05 MED FILL — LOSARTAN POTASSIUM 100 MG T: 100 | 30 days supply | Qty: 30 | Fill #1

## 2017-08-05 MED FILL — ?CETIRIZINE HCL 10 MG TABLE: 10 | 30 days supply | Qty: 30 | Fill #2

## 2017-08-05 MED FILL — traMADol HCL 50 MG TABS: 50 | 30 days supply | Qty: 90 | Fill #1

## 2017-09-04 MED FILL — traMADol HCL 50 MG TABS: 50 | 30 days supply | Qty: 90 | Fill #2

## 2017-09-04 MED FILL — OMEPRAZOLE DR 20 MG CAPSULE: 20 | 30 days supply | Qty: 30 | Fill #2

## 2017-09-04 MED FILL — LOSARTAN POTASSIUM 100 MG T: 100 | 30 days supply | Qty: 30 | Fill #2

## 2017-09-23 ENCOUNTER — Ambulatory Visit: Payer: Self-pay | Attending: Family Medicine

## 2017-10-02 ENCOUNTER — Other Ambulatory Visit: Payer: Self-pay | Admitting: Family Medicine

## 2017-10-02 DIAGNOSIS — D571 Sickle-cell disease without crisis: Secondary | ICD-10-CM

## 2017-10-02 DIAGNOSIS — I10 Essential (primary) hypertension: Secondary | ICD-10-CM

## 2017-10-02 MED FILL — OMEPRAZOLE 20 MG CAP: 20 | 30 days supply | Qty: 30 | Fill #0

## 2017-10-02 MED FILL — LOSARTAN POTASSIUM 100 MG T: 100 | 30 days supply | Qty: 30 | Fill #0

## 2017-10-07 ENCOUNTER — Telehealth: Payer: Self-pay | Admitting: Family Medicine

## 2017-10-07 NOTE — Telephone Encounter (Signed)
Pt came to the office to request a refill for  traMADol (ULTRAM) 50 MG tablet [facility] [place] Please follow up

## 2017-10-08 ENCOUNTER — Other Ambulatory Visit: Payer: Self-pay | Admitting: Family Medicine

## 2017-10-08 DIAGNOSIS — D571 Sickle-cell disease without crisis: Secondary | ICD-10-CM

## 2017-10-08 NOTE — Telephone Encounter (Signed)
Addressed.

## 2017-11-11 ENCOUNTER — Other Ambulatory Visit: Payer: Self-pay | Admitting: Family Medicine

## 2017-11-11 MED FILL — ?CETIRIZINE HCL 10 MG TABLE: 10 | 30 days supply | Qty: 30 | Fill #0

## 2017-11-11 MED FILL — ?OMEPRAZOLE DR 20MG CAPSULE: 20 | 30 days supply | Qty: 30 | Fill #0

## 2017-11-11 MED FILL — LOSARTAN POTASSIUM 100 MG T: 100 | 30 days supply | Qty: 30 | Fill #0

## 2017-11-13 MED FILL — traMADol HCL 50 MG TABS: 50 | 30 days supply | Qty: 90 | Fill #0

## 2017-11-22 ENCOUNTER — Ambulatory Visit: Payer: Self-pay | Admitting: Family Medicine

## 2017-12-12 ENCOUNTER — Other Ambulatory Visit: Payer: Self-pay | Admitting: Family Medicine

## 2017-12-12 MED FILL — ?CETIRIZINE HCL 10 MG TABLE: 10 | 30 days supply | Qty: 30 | Fill #0

## 2017-12-12 MED FILL — ?OMEPRazole 20mg CPDR: 20 | 30 days supply | Qty: 60 | Fill #0

## 2017-12-12 MED FILL — LOSARTAN POTASSIUM 100 MG T: 100 | 30 days supply | Qty: 30 | Fill #1

## 2017-12-12 MED FILL — traMADol HCL 50 MG TABS: 50 | 30 days supply | Qty: 90 | Fill #1

## 2018-01-09 ENCOUNTER — Other Ambulatory Visit: Payer: Self-pay | Admitting: Family Medicine

## 2018-01-09 DIAGNOSIS — I10 Essential (primary) hypertension: Secondary | ICD-10-CM

## 2018-01-09 MED FILL — LOSARTAN POTASSIUM 100 MG T: 100 | 13 days supply | Qty: 13 | Fill #0

## 2018-01-09 MED FILL — traMADol HCL 50 MG TABS: 50 | 30 days supply | Qty: 90 | Fill #2

## 2018-01-09 MED FILL — ?OMEPRazole 20mg CPDR: 20 | 13 days supply | Qty: 26 | Fill #0

## 2018-01-09 NOTE — Telephone Encounter (Signed)
Pt has not had office visit since 05/22/17. Will fill for enough medication to last until upcoming encounter with PCP. Sent note to pharmacy informing of this.

## 2018-01-21 ENCOUNTER — Ambulatory Visit: Payer: Self-pay | Attending: Family Medicine | Admitting: Family Medicine

## 2018-01-21 ENCOUNTER — Encounter: Payer: Self-pay | Admitting: Family Medicine

## 2018-01-21 ENCOUNTER — Ambulatory Visit: Payer: Self-pay | Admitting: Licensed Clinical Social Worker

## 2018-01-21 VITALS — BP 120/70 | HR 80 | Temp 97.8°F | Resp 18 | Ht 61.0 in | Wt 169.0 lb

## 2018-01-21 DIAGNOSIS — Z885 Allergy status to narcotic agent status: Secondary | ICD-10-CM | POA: Insufficient documentation

## 2018-01-21 DIAGNOSIS — F4321 Adjustment disorder with depressed mood: Secondary | ICD-10-CM

## 2018-01-21 DIAGNOSIS — K219 Gastro-esophageal reflux disease without esophagitis: Secondary | ICD-10-CM | POA: Insufficient documentation

## 2018-01-21 DIAGNOSIS — Z7982 Long term (current) use of aspirin: Secondary | ICD-10-CM | POA: Insufficient documentation

## 2018-01-21 DIAGNOSIS — I10 Essential (primary) hypertension: Secondary | ICD-10-CM | POA: Insufficient documentation

## 2018-01-21 DIAGNOSIS — Z79899 Other long term (current) drug therapy: Secondary | ICD-10-CM | POA: Insufficient documentation

## 2018-01-21 DIAGNOSIS — D571 Sickle-cell disease without crisis: Secondary | ICD-10-CM | POA: Insufficient documentation

## 2018-01-21 MED ORDER — OMEPRAZOLE 20 MG PO CPDR: 40.0000 mg | DELAYED_RELEASE_CAPSULE | Freq: Every day | ORAL | 6 refills | Status: DC

## 2018-01-21 MED ORDER — TRAMADOL HCL 50 MG PO TABS: 50.0000 mg | ORAL_TABLET | Freq: Three times a day (TID) | ORAL | 2 refills | Status: DC

## 2018-01-21 MED ORDER — LOSARTAN POTASSIUM 100 MG PO TABS: 100.0000 mg | ORAL_TABLET | Freq: Every day | ORAL | 6 refills | Status: DC

## 2018-01-21 NOTE — Progress Notes (Signed)
Subjective:  Patient ID: Kayla Woodard, female    DOB: [birth year]  Age: 62 y.o. MRN: [id number]  CC: Sickle Cell Anemia   HPI Kayla Woodard is a 62 year old female with a history of sickle cell anemia (previously followed at the sickle cell clinic), hypertension, GERD who presents today for follow-up visit With regards to her sickle cell disease today is a good day but sometimes when she has been working for long hours she does experience exacerbation of her pain and has to take her tramadol 3 times a day. Her reflux is controlled and she is doing well on her antihypertensive.  Amlodipine appears on her med list however she declines taking it and only takes losartan. 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  She checked history of suicidal ideations on the form but denies that while speaking to me.  She goes on to say by the end of the year if her condition does not improve she will be returning back to her home country.  Past Medical History:  Diagnosis Date  . Anemia   . Back pain, chronic   . Hypertension   . Sickle cell anemia (HCC)     Past Surgical History:  Procedure Laterality Date  . CESAREAN SECTION      Allergies  Allergen Reactions  . Oxycodone Itching  . Vicodin [Hydrocodone-Acetaminophen] Itching     Outpatient Medications Prior to Visit  Medication Sig Dispense Refill  . aspirin EC 81 MG tablet Take 81 mg by mouth daily.    . cetirizine (ZYRTEC) 10 MG tablet TAKE 1 TABLET BY MOUTH ONCE DAILY 30 tablet 0  . ergocalciferol (DRISDOL) 50000 units capsule Take 1 capsule (50,000 Units total) by mouth once a week. 9 capsule 1  . folic acid (FOLVITE) 1 MG tablet Take 2 tablets (2 mg total) by mouth daily. 60 tablet 3  . losartan (COZAAR) 100 MG tablet TAKE 1 TABLET BY MOUTH ONCE DAILY 13 tablet 0  . traMADol (ULTRAM) 50 MG tablet  TAKE 1 TABLET BY MOUTH 3 TIMES DAILY 90 tablet 2  . benzocaine (HURRICAINE) 20 % GEL Apply 4 times daily to aphthous ulcer until healed (Patient not taking: Reported on 09/24/2016) 30 g 1  . cyclobenzaprine (FLEXERIL) 10 MG tablet Take 1 tablet (10 mg total) by mouth 2 (two) times daily as needed for muscle spasms. (Patient not taking: Reported on 05/22/2017) 60 tablet 2  . ferrous sulfate 325 (65 FE) MG tablet Take 1 tablet (325 mg total) by mouth daily. (Patient not taking: Reported on 05/22/2017) 30 tablet 0  . meclizine (ANTIVERT) 25 MG tablet Take 1 tablet (25 mg total) by mouth 2 (two) times daily as needed for dizziness. (Patient not taking: Reported on 05/22/2017) 40 tablet 2  . amLODipine (NORVASC) 5 MG tablet Take 1 tablet (5 mg total) by mouth daily. (Patient not taking: Reported on 01/21/2018) 30 tablet 2  . azithromycin (ZITHROMAX) 250 MG tablet Take 2 today then 1 daily 6 tablet 0  . benzonatate (TESSALON) 100 MG capsule TAKE 1 CAPSULE (100 MG TOTAL) BY MOUTH 3 (THREE) TIMES DAILY AS NEEDED FOR COUGH. 30 capsule 0  . omeprazole (PRILOSEC) 20 MG capsule TAKE 2 CAPSULES BY MOUTH DAILY. (Patient not taking: Reported on 01/21/2018) 26 capsule 0   No facility-administered medications prior to visit.     ROS Review of  Systems  Constitutional: Negative for activity change, appetite change and fatigue.  HENT: Negative for congestion, sinus pressure and sore throat.   Eyes: Negative for visual disturbance.  Respiratory: Negative for cough, chest tightness, shortness of breath and wheezing.   Cardiovascular: Negative for chest pain and palpitations.  Gastrointestinal: Negative for abdominal distention, abdominal pain and constipation.  Endocrine: Negative for polydipsia.  Genitourinary: Negative for dysuria and frequency.  Musculoskeletal: Negative for arthralgias and back pain.  Skin: Negative for rash.  Neurological: Negative for tremors, light-headedness and numbness.  Hematological:  Does not bruise/bleed easily.  Psychiatric/Behavioral: Negative for agitation and behavioral problems.    Objective:  BP 120/70 (BP Location: Right Arm, Patient Position: Sitting, Cuff Size: Large)   Pulse 80   Temp 97.8 F (36.6 C) (Oral)   Resp 18   Ht 5' 1" (1.549 m)   Wt 169 lb (76.7 kg)   SpO2 100%   BMI 31.93 kg/m   BP/Weight 01/21/2018 05/22/2017 41/93/7902  Systolic BP 409 735 329  Diastolic BP 70 89 98  Wt. (Lbs) 169 172.6 171  BMI 31.93 29.63 33.4      Physical Exam  Constitutional: She is oriented to person, place, and time. She appears well-developed and well-nourished.  Cardiovascular: Normal rate, normal heart sounds and intact distal pulses.  No murmur heard. Pulmonary/Chest: Effort normal and breath sounds normal. She has no wheezes. She has no rales. She exhibits no tenderness.  Abdominal: Soft. Bowel sounds are normal. She exhibits no distension and no mass. There is no tenderness.  Musculoskeletal: Normal range of motion.  Neurological: She is alert and oriented to person, place, and time.  Skin: Skin is warm and dry.  Psychiatric: She has a normal mood and affect.     CMP Latest Ref Rng & Units 04/30/2017 02/04/2017 10/15/2016  Glucose 65 - 99 mg/dL 121(H) 89 97  BUN 6 - 20 mg/dL _0 Creatinine 0.44 - 1.00 mg/dL 0.79 0.81 0.76  Sodium 135 - 145 mmol/L 139 140 141  Potassium 3.5 - 5.1 mmol/L 4.0 4.4 3.6  Chloride 101 - 111 mmol/L 107 105 104  CO2 22 - 32 mmol/L _1 Calcium 8.9 - 10.3 mg/dL 8.9 9.3 8.9  Total Protein 6.5 - 8.1 g/dL 7.3 7.2 7.1  Total Bilirubin 0.3 - 1.2 mg/dL 1.2 1.2 0.9  Alkaline Phos 38 - 126 U/L 90 107 101  AST 15 - 41 U/L _2 ALT 14 - 54 U/L _3 CBC    Component Value Date/Time   WBC 4.6 04/30/2017 1033   RBC 4.33 04/30/2017 1033   RBC 4.33 04/30/2017 1033   HGB 10.8 (L) 04/30/2017 1033   HGB 10.6 (L) 02/04/2017 1057   HGB 10.2 (L) 11/21/2010 1336   HCT 29.9 (L) 04/30/2017 1033   HCT 30.5 (L)  02/04/2017 1057   HCT 29.2 (L) 11/21/2010 1336   PLT 145 (L) 04/30/2017 1033   PLT 167 02/04/2017 1057   MCV 69.1 (L) 04/30/2017 1033   MCV 74 (L) 02/04/2017 1057   MCV 76.3 (L) 11/21/2010 1336   MCH 24.9 (L) 04/30/2017 1033   MCHC 36.1 (H) 04/30/2017 1033   RDW 18.2 (H) 04/30/2017 1033   RDW 18.2 (H) 02/04/2017 1057   RDW 19.3 (H) 11/21/2010 1336   LYMPHSABS 1.4 04/30/2017 1033   LYMPHSABS 1.5 02/04/2017 1057   LYMPHSABS 1.6 11/21/2010 1336   MONOABS 0.2 04/30/2017 1033   MONOABS  0.3 11/21/2010 1336   EOSABS 0.0 04/30/2017 1033   EOSABS 0.1 02/04/2017 1057   BASOSABS 0.0 04/30/2017 1033   BASOSABS 0.0 02/04/2017 1057   BASOSABS 0.0 11/21/2010 1336    Lipid Panel     Component Value Date/Time   CHOL 143 08/27/2016 1028   TRIG 35 08/27/2016 1028   HDL 53 08/27/2016 1028   CHOLHDL 2.7 08/27/2016 1028   VLDL 7 08/27/2016 1028   LDLCALC 83 08/27/2016 1028    Assessment & Plan:   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) Controlled substances agreement signed today - traMADol (ULTRAM) 50 MG tablet; Take 1 tablet (50 mg total) by mouth 3 (three) times daily.  Dispense: 90 tablet; Refill: 2 - CBC with Differential/Platelet  2. Essential hypertension Controlled Counseled on blood pressure goal of less than 130/80, low-sodium, DASH diet, medication compliance, 150 minutes of moderate intensity exercise per week. Discussed medication compliance, adverse effects. - losartan (COZAAR) 100 MG tablet; Take 1 tablet (100 mg total) by mouth daily.  Dispense: 30 tablet; Refill: 6 - CMP14+EGFR - Lipid panel  3. Situational depression Due to financial status and immigration issues She declines initiation of medications LCSW called in for counseling  4. Gastroesophageal reflux disease, esophagitis presence not specified - omeprazole (PRILOSEC) 20 MG capsule; Take 2 capsules (40 mg total) by mouth daily.  Dispense: 60 capsule; Refill: 6   Meds ordered this encounter  Medications  .  traMADol (ULTRAM) 50 MG tablet    Sig: Take 1 tablet (50 mg total) by mouth 3 (three) times daily.    Dispense:  90 tablet    Refill:  2  . losartan (COZAAR) 100 MG tablet    Sig: Take 1 tablet (100 mg total) by mouth daily.    Dispense:  30 tablet    Refill:  6    Patient has office visit 01/21/18. Will send in enough to last her until that appointment. Must have OV for future refills.  Marland Kitchen omeprazole (PRILOSEC) 20 MG capsule    Sig: Take 2 capsules (40 mg total) by mouth daily.    Dispense:  60 capsule    Refill:  6    Patient has office visit 01/21/18. Will send in enough to last her until that appointment. Must have OV for future refills.    Follow-up: Return in about 6 months (around 07/24/2018) for complete physical exam.   Charlott Rakes MD

## 2018-01-21 NOTE — BH Specialist Note (Signed)
Integrated Behavioral Health Initial Visit  MRN: [id number] Name: Kayla Woodard  Number of Integrated Behavioral Health Clinician visits:: 1/6 Session Start time: 9:20 AM  [id number] End time: 9:35 AM Total time: 15 minutes  Type of Service: Integrated Behavioral Health- Individual/Family Interpretor:No. Interpretor Name and Language: N/A   Warm Hand Off Completed.       SUBJECTIVE: Kayla Woodard is a 62 y.o. female accompanied by self Patient was referred by Dr. Alvis LemmingsNewlin for depression. 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 Reduce symptoms of: depression and stress 2. Increase knowledge and/or ability of: coping skills  3. Demonstrate ability to: Increase adequate support systems for patient/family  INTERVENTIONS: Interventions utilized: Supportive Counseling, Psychoeducation and/or Health Education and Link to [facility]  Standardized Assessments completed: GAD-7 and PHQ 2&9 with C-SSRS  ASSESSMENT: Patient currently experiencing depression triggered by financial strain, stress regarding citizenship, and limited support in the community. She reports feelings of sadness, difficulty sleeping, and low energy. Pt scored positive on the PHQ-9; however, denied SI/HI/AVH.    Patient may benefit from psychoeducation and psychotherapy. LCSWA educated pt on the correlation between one's physical and mental health, in  addition, to how stress can negatively impact health. Strategies were discussed to increase mood and decrease symptoms. Pt was provided supportive resources to assist with rent/utilities and strengthen support system.   PLAN: 1. Follow up with behavioral health clinician on : Pt was encouraged to contact LCSWA if symptoms worsen or fail to improve [facility] 2. Behavioral recommendations: LCSWA recommends that pt apply healthy coping skills discussed and utilize provided resources. Pt is encouraged to schedule follow up appointment with LCSWA 3. Referral(s): Integrated [facility]al Health Services (In Clinic) and [employer]:  Finances 4. "From scale of 1-10, how likely are you to follow plan?":   Bridgett LarssonJasmine D Lewis, LCSW 01/23/18 4:28 PM

## 2018-01-21 NOTE — Patient Instructions (Signed)

## 2018-01-22 LAB — CBC WITH DIFFERENTIAL/PLATELET
Basophils Absolute: 0 10*3/uL (ref 0.0–0.2)
Basos: 1 %
EOS (ABSOLUTE): 0.1 10*3/uL (ref 0.0–0.4)
Eos: 2 %
Hematocrit: 32.1 % — ABNORMAL LOW (ref 34.0–46.6)
Hemoglobin: 10.9 g/dL — ABNORMAL LOW (ref 11.1–15.9)
Immature Grans (Abs): 0 10*3/uL (ref 0.0–0.1)
Immature Granulocytes: 0 %
LYMPHS ABS: 1.3 10*3/uL (ref 0.7–3.1)
LYMPHS: 31 %
MCH: 25.9 pg — AB (ref 26.6–33.0)
MCHC: 34 g/dL (ref 31.5–35.7)
MCV: 76 fL — ABNORMAL LOW (ref 79–97)
Monocytes Absolute: 0.3 10*3/uL (ref 0.1–0.9)
Monocytes: 7 %
NEUTROS ABS: 2.5 10*3/uL (ref 1.4–7.0)
Neutrophils: 59 %
Platelets: 152 10*3/uL (ref 150–450)
RBC: 4.21 x10E6/uL (ref 3.77–5.28)
RDW: 17.6 % — ABNORMAL HIGH (ref 12.3–15.4)
WBC: 4.2 10*3/uL (ref 3.4–10.8)

## 2018-01-22 LAB — CMP14+EGFR
ALT: 13 IU/L (ref 0–32)
AST: 17 IU/L (ref 0–40)
Albumin/Globulin Ratio: 1.6 (ref 1.2–2.2)
Albumin: 4.6 g/dL (ref 3.6–4.8)
Alkaline Phosphatase: 97 IU/L (ref 39–117)
BUN/Creatinine Ratio: 13 (ref 12–28)
BUN: 11 mg/dL (ref 8–27)
Bilirubin Total: 1.2 mg/dL (ref 0.0–1.2)
CALCIUM: 9 mg/dL (ref 8.7–10.3)
CO2: 20 mmol/L (ref 20–29)
Chloride: 108 mmol/L — ABNORMAL HIGH (ref 96–106)
Creatinine, Ser: 0.82 mg/dL (ref 0.57–1.00)
GFR calc Af Amer: 90 mL/min/{1.73_m2} (ref >59)
GFR calc non Af Amer: 78 mL/min/{1.73_m2} (ref >59)
GLUCOSE: 74 mg/dL (ref 65–99)
Globulin, Total: 2.8 g/dL (ref 1.5–4.5)
Potassium: 4.2 mmol/L (ref 3.5–5.2)
Sodium: 145 mmol/L — ABNORMAL HIGH (ref 134–144)
Total Protein: 7.4 g/dL (ref 6.0–8.5)

## 2018-01-22 LAB — LIPID PANEL
CHOLESTEROL TOTAL: 152 mg/dL (ref 100–199)
Chol/HDL Ratio: 2.8 ratio (ref 0.0–4.4)
HDL: 54 mg/dL (ref >39)
LDL CALC: 91 mg/dL (ref 0–99)
TRIGLYCERIDES: 37 mg/dL (ref 0–149)
VLDL Cholesterol Cal: 7 mg/dL (ref 5–40)

## 2018-01-24 ENCOUNTER — Telehealth: Payer: Self-pay

## 2018-01-24 NOTE — Telephone Encounter (Signed)
Patient was called and informed of lab results. 

## 2018-02-19 ENCOUNTER — Other Ambulatory Visit: Payer: Self-pay | Admitting: Family Medicine

## 2018-02-19 MED FILL — ?OMEPRazole 20mg CPDR: 20 | 30 days supply | Qty: 60 | Fill #0

## 2018-02-19 MED FILL — traMADol HCL 50 MG TABS: 50 | 30 days supply | Qty: 90 | Fill #0

## 2018-02-19 MED FILL — LOSARTAN POTASSIUM 100 MG T: 100 | 30 days supply | Qty: 30 | Fill #0

## 2018-02-20 MED FILL — FOLIC ACID 1 MG TABS: 1 | 30 days supply | Qty: 60 | Fill #0

## 2018-03-25 ENCOUNTER — Other Ambulatory Visit: Payer: Self-pay | Admitting: Family Medicine

## 2018-03-25 DIAGNOSIS — J40 Bronchitis, not specified as acute or chronic: Secondary | ICD-10-CM

## 2018-03-25 MED FILL — LOSARTAN POTASSIUM 100 MG T: 100 | 30 days supply | Qty: 30 | Fill #1

## 2018-03-25 MED FILL — ?OMEPRazole 20mg CPDR: 20 | 30 days supply | Qty: 60 | Fill #1

## 2018-03-25 MED FILL — traMADol HCL 50 MG TABS: 50 | 30 days supply | Qty: 90 | Fill #1

## 2018-03-25 MED FILL — FOLIC ACID 1 MG TABS: 1 | 30 days supply | Qty: 60 | Fill #1

## 2018-04-21 MED FILL — FOLIC ACID 1 MG TABS: 1 | 30 days supply | Qty: 60 | Fill #2

## 2018-04-21 MED FILL — LOSARTAN POTASSIUM 100 MG T: 100 | 30 days supply | Qty: 30 | Fill #2

## 2018-04-21 MED FILL — OMEPRAZOLE 20 MG CAP: 20 | 30 days supply | Qty: 60 | Fill #2

## 2018-04-22 MED FILL — traMADol HCL 50 MG TABS: 50 | 30 days supply | Qty: 90 | Fill #2

## 2018-05-19 ENCOUNTER — Other Ambulatory Visit: Payer: Self-pay | Admitting: Family Medicine

## 2018-05-19 DIAGNOSIS — D571 Sickle-cell disease without crisis: Secondary | ICD-10-CM

## 2018-05-19 MED FILL — LOSARTAN POTASSIUM 100 MG T: 100 | 30 days supply | Qty: 30 | Fill #3

## 2018-05-21 MED FILL — OMEPRAZOLE 20 MG CAP: 20 | 30 days supply | Qty: 60 | Fill #3

## 2018-05-21 MED FILL — traMADol HCL 50 MG TABS: 50 | 30 days supply | Qty: 90 | Fill #0

## 2018-05-21 MED FILL — FOLIC ACID 1 MG TABS: 1 | 30 days supply | Qty: 60 | Fill #3

## 2018-05-21 NOTE — Telephone Encounter (Signed)
Pt last seen: 01/21/18 Next appt: n/a Last RX written on: 01/21/18 Date of original fill: 02/19/18 Date of refill(s): 03/25/18, 04/22/18  No other controlled substances were filled during this time, please refill if appropriate.

## 2018-05-21 NOTE — Telephone Encounter (Signed)
Pt called to request a medication refill on -traMADol (ULTRAM) 50 MG tablet To [facility] pharmacy  Please follow up

## 2018-06-18 ENCOUNTER — Other Ambulatory Visit: Payer: Self-pay | Admitting: Family Medicine

## 2018-06-18 MED FILL — LOSARTAN POTASSIUM 100 MG T: 100 | 30 days supply | Qty: 30 | Fill #4

## 2018-06-18 MED FILL — traMADol HCL 50 MG TABS: 50 | 30 days supply | Qty: 90 | Fill #1

## 2018-06-18 MED FILL — OMEPRAZOLE 20 MG CAP: 20 | 30 days supply | Qty: 60 | Fill #4

## 2018-06-20 MED FILL — FOLIC ACID 1 MG TABS: 1 | 30 days supply | Qty: 60 | Fill #0

## 2018-07-03 ENCOUNTER — Encounter (HOSPITAL_COMMUNITY): Payer: Self-pay | Admitting: Emergency Medicine

## 2018-07-03 ENCOUNTER — Ambulatory Visit (HOSPITAL_COMMUNITY)
Admission: EM | Admit: 2018-07-03 | Discharge: 2018-07-03 | Disposition: A | Discharge status: Home / Self Care | Payer: Self-pay | Attending: Emergency Medicine | Admitting: Emergency Medicine

## 2018-07-03 ENCOUNTER — Other Ambulatory Visit: Payer: Self-pay

## 2018-07-03 DIAGNOSIS — M7918 Myalgia, other site: Secondary | ICD-10-CM | POA: Insufficient documentation

## 2018-07-03 DIAGNOSIS — M545 Low back pain, unspecified: Secondary | ICD-10-CM

## 2018-07-03 MED ORDER — CYCLOBENZAPRINE HCL 5 MG PO TABS: 5.0000 mg | ORAL_TABLET | Freq: Two times a day (BID) | ORAL | 0 refills | Status: DC | PRN

## 2018-07-03 MED ORDER — IBUPROFEN 600 MG PO TABS: 600.0000 mg | ORAL_TABLET | Freq: Four times a day (QID) | ORAL | 0 refills | Status: DC | PRN

## 2018-07-03 MED ORDER — KETOROLAC TROMETHAMINE 60 MG/2ML IM SOLN
INTRAMUSCULAR | Status: AC
  Filled 2018-07-03: qty 2

## 2018-07-03 MED ORDER — KETOROLAC TROMETHAMINE 60 MG/2ML IM SOLN
60.0000 mg | Freq: Once | INTRAMUSCULAR | Status: AC
  Administered 2018-07-03: 60 mg via INTRAMUSCULAR

## 2018-07-03 MED FILL — IBUPROFEN 600 MG TABLET: 600 | 8 days supply | Qty: 30 | Fill #0

## 2018-07-03 MED FILL — CYCLOBENZAPRINE 5 MG TABLET: 5 | 6 days supply | Qty: 24 | Fill #0

## 2018-07-03 NOTE — ED Triage Notes (Signed)
PT reports lower back and bilateral leg pain for 8 days. No injury. PT has sickle cell disease.   PT takes a tramadol daily for pain, this has not been effective.

## 2018-07-03 NOTE — Discharge Instructions (Signed)
We gave you a shot of Toradol for your pain today. This should begin working in 30-40 minutes Use anti-inflammatories for pain/swelling. You may take up to 600 mg Ibuprofen every 8 hours with food. You may supplement Ibuprofen with Tylenol [phone] mg every 8 hours.  May use with Tramadol- Do not drive or work after taking You may use flexeril as needed to help with pain. This is a muscle relaxer and causes sedation- please use only at bedtime or when you will be home and not have to drive/work- Begin with 1 tablet, may increase to 2 if not causing significant drowsiness. Do not take at same time as Tramadol  Please got to emergency room if symptoms worsening, developing chest pain, shortness of breath, numbness or tingling

## 2018-07-04 ENCOUNTER — Ambulatory Visit: Payer: Self-pay | Admitting: Family Medicine

## 2018-07-04 NOTE — ED Provider Notes (Signed)
[facility]    CSN: [id number] Arrival date & time: 07/03/18  [id number]     History   Chief Complaint Chief Complaint  Patient presents with  . Back Pain  . Leg Pain    HPI Kayla Woodard interpretation via [employer] interpreter Kayla Woodard is a 63 y.o. female history of sickle cell anemia, hypertension, presenting today for evaluation of lower back and leg pain.  Patient states that for the past 8 days she has had pain in her back and lower legs.  She states that she has been taking tramadol which has not provided her with relief.  She denies any injury or increase in activity.  Denies any numbness or tingling.  Denies any saddle anesthesia.  Denies changes in urination or bowel movements.  Denies weakness in legs.  Patient states that this feels like the typical pain she feels with sickle cell.  She denies any chest pain or shortness of breath.  Does not feel she is in a crisis.  Typically the tramadol works but this is not been helping.  She states [place] the [place] she was prescribed ibuprofen which helped with her pain.  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  She has had difficulty sleeping due to her pain.  HPI  Past Medical History:  Diagnosis Date  . Anemia   . Back pain, chronic   . Hypertension   . Sickle cell anemia [facility](HCC)     Patient Active Problem List   Diagnosis Date Noted  . Elevated liver enzymes 09/24/2016  . Vertigo 08/27/2016  . Chronic pain 01/11/2016  . COUGH 11/15/2009  . OTALGIA 06/27/2009  . BACK PAIN, THORACIC REGION 06/27/2009  . HYPOKALEMIA 03/29/2009  . NECK SPRAIN AND STRAIN 03/29/2009  . Sickle cell anemia (HCC) 03/21/2009  . DEPRESSION 03/21/2009  . ESSENTIAL HYPERTENSION, BENIGN 03/21/2009  . ALLERGIC RHINITIS 03/21/2009  . GERD 03/21/2009  . LUMBAGO 03/21/2009  . CATARACT EXTRACTION, HX OF 03/21/2009    Past Surgical History:  Procedure Laterality Date  . CESAREAN SECTION      OB History   No obstetric  history on file.      Home Medications    Prior to Admission medications   Medication Sig Start Date End Date Taking? Authorizing Provider  cetirizine (ZYRTEC) 10 MG tablet TAKE 1 TABLET BY MOUTH ONCE DAILY 05/21/18  Yes Newlin, Odette HornsEnobong, MD  folic acid (FOLVITE) 1 MG tablet TAKE 2 TABLETS BY MOUTH DAILY 06/20/18  Yes Newlin, Enobong, MD  losartan (COZAAR) 100 MG tablet Take 1 tablet (100 mg total) by mouth daily. 01/21/18  Yes Hoy RegisterNewlin, Enobong, MD  omeprazole (PRILOSEC) 20 MG capsule Take 2 capsules (40 mg total) by mouth daily. 01/21/18  Yes Newlin, Enobong, MD  traMADol (ULTRAM) 50 MG tablet TAKE 1 TABLET BY MOUTH 3 (THREE) TIMES DAILY. 05/21/18  Yes Hoy RegisterNewlin, Enobong, MD  aspirin EC 81 MG tablet Take 81 mg by mouth daily.    [provider]  cyclobenzaprine (FLEXERIL) 5 MG tablet Take 1-2 tablets (5-10 mg total) by mouth 2 (two) times daily as needed. Will cause drowsiness, do not drive/work after use. 07/03/18   ,  C, PA-C  ergocalciferol (DRISDOL) 50000 units capsule Take 1 capsule (50,000 Units total) by mouth once a week. 02/06/17   Hoy RegisterNewlin, Enobong, MD  ibuprofen (ADVIL,MOTRIN) 600 MG tablet Take 1 tablet (600 mg total) by mouth every 6 (six) hours as needed. 07/03/18   , Junius Creamer C, PA-C  Family History Family History  Problem Relation Age of Onset  . Hypertension Mother   . Sickle cell anemia Father     Social History Social History   Tobacco Use  . Smoking status: Never Smoker  . Smokeless tobacco: Never Used  Substance Use Topics  . Alcohol use: No  . Drug use: No     Allergies   Oxycodone and Vicodin [hydrocodone-acetaminophen]   Review of Systems Review of Systems  Constitutional: Negative for fatigue and fever.  HENT: Negative for congestion, sinus pressure and sore throat.   Eyes: Negative for photophobia, pain and visual disturbance.  Respiratory: Negative for cough and shortness of breath.   Cardiovascular: Negative for chest pain.    Gastrointestinal: Negative for abdominal pain, nausea and vomiting.  Genitourinary: Negative for decreased urine volume, difficulty urinating and hematuria.  Musculoskeletal: Positive for arthralgias, back pain and myalgias. Negative for neck pain and neck stiffness.  Neurological: Negative for dizziness, syncope, facial asymmetry, speech difficulty, weakness, light-headedness, numbness and headaches.     Physical Exam Triage Vital Signs ED Triage Vitals  Enc Vitals Group     BP 07/03/18 0933 (!) 152/88     Pulse Rate 07/03/18 0933 83     Resp 07/03/18 0933 16     Temp 07/03/18 0933 97.7 F (36.5 C)     Temp Source 07/03/18 0933 Oral     SpO2 07/03/18 0933 100 %     Weight --      Height --      Head Circumference --      Peak Flow --      Pain Score 07/03/18 0934 7     Pain Loc --      Pain Edu? --      Excl. in GC? --    No data found.  Updated Vital Signs BP (!) 152/88   Pulse 83   Temp 97.7 F (36.5 C) (Oral)   Resp 16   SpO2 100%   Visual Acuity Right Eye Distance:   Left Eye Distance:   Bilateral Distance:    Right Eye Near:   Left Eye Near:    Bilateral Near:     Physical Exam Vitals signs and nursing note reviewed.  Constitutional:      General: She is not in acute distress.    Appearance: She is well-developed.  HENT:     Head: Normocephalic and atraumatic.     Mouth/Throat:     Comments: Oral mucosa pink and moist, no tonsillar enlargement or exudate. Posterior pharynx patent and nonerythematous, no uvula deviation or swelling. Normal phonation. Eyes:     Extraocular Movements: Extraocular movements intact.     Conjunctiva/sclera: Conjunctivae normal.     Pupils: Pupils are equal, round, and reactive to light.  Neck:     Musculoskeletal: Neck supple.  Cardiovascular:     Rate and Rhythm: Normal rate and regular rhythm.     Heart sounds: No murmur.  Pulmonary:     Effort: Pulmonary effort is normal. No respiratory distress.     Breath  sounds: Normal breath sounds.     Comments: Breathing comfortably at rest, CTABL, no wheezing, rales or other adventitious sounds auscultated  Abdominal:     Palpations: Abdomen is soft.     Tenderness: There is no abdominal tenderness.  Musculoskeletal:     Comments: Nontender to palpation of lumbar spine midline, increased tender notes to palpation of bilateral lumbar musculature laterally, negative straight leg raise bilaterally  Strength 5/5 and equal bilaterally at hips knees Patellar reflex 2+  Dorsalis pedis 2+ bilaterally, good cap refill No lower extremity edema bilaterally  Skin:    General: Skin is warm and dry.  Neurological:     General: No focal deficit present.     Mental Status: She is alert and oriented to person, place, and time. Mental status is at baseline.      UC Treatments / Results  Labs (all labs ordered are listed, but only abnormal results are displayed) Labs Reviewed - No data to display  EKG None  Radiology No results found.  Procedures Procedures (including critical care time)  Medications Ordered in UC Medications  ketorolac (TORADOL) injection 60 mg (60 mg Intramuscular Given 07/03/18 1036)    Initial Impression / Assessment and Plan / UC Course  I have reviewed the triage vital signs and the nursing notes.  Pertinent labs & imaging results that were available during my care of the patient were reviewed by me and considered in my medical decision making (see chart for details).     Patient with lower back pain.  Likely more musculoskeletal cause.  Neurovascularly intact.  Do not suspect underlying venoocclusive crisis.  Negative red flags for cauda equina.  Will provide Toradol in clinic today, will send home with ibuprofen to add in, tramadol for severe pain.  Also provided Flexeril in hopes to help with discomfort with sleep.  Discussed drowsiness regarding Flexeril.  Advised patient that she if she develops chest pain, shortness of  breath, worsening pain she should go to the emergency room for further evaluation of her symptoms, underlying sickle cell disease.Discussed strict return precautions. Patient verbalized understanding and is agreeable with plan.  Final Clinical Impressions(s) / UC Diagnoses   Final diagnoses:  Acute bilateral low back pain without sciatica  Musculoskeletal pain     Discharge Instructions     We gave you a shot of Toradol for your pain today. This should begin working in 30-40 minutes Use anti-inflammatories for pain/swelling. You may take up to 600 mg Ibuprofen every 8 hours with food. You may supplement Ibuprofen with Tylenol [phone] mg every 8 hours.  May use with Tramadol- Do not drive or work after taking You may use flexeril as needed to help with pain. This is a muscle relaxer and causes sedation- please use only at bedtime or when you will be home and not have to drive/work- Begin with 1 tablet, may increase to 2 if not causing significant drowsiness. Do not take at same time as Tramadol  Please got to emergency room if symptoms worsening, developing chest pain, shortness of breath, numbness or tingling   ED Prescriptions    Medication Sig Dispense Auth. Provider   ibuprofen (ADVIL,MOTRIN) 600 MG tablet Take 1 tablet (600 mg total) by mouth every 6 (six) hours as needed. 30 tablet ,  C, PA-C   cyclobenzaprine (FLEXERIL) 5 MG tablet Take 1-2 tablets (5-10 mg total) by mouth 2 (two) times daily as needed. Will cause drowsiness, do not drive/work after use. 24 tablet , [place] C, PA-C     Controlled Substance Prescriptions [place] Controlled Substance Registry consulted? Not Applicable   Lew Dawes,  C, [place] 07/04/18 1322

## 2018-07-10 ENCOUNTER — Ambulatory Visit: Payer: Self-pay

## 2018-07-15 ENCOUNTER — Other Ambulatory Visit: Payer: Self-pay | Admitting: Family Medicine

## 2018-07-15 NOTE — Telephone Encounter (Signed)
1) Medication(s) Requested (by name): ibuprofen 2) Pharmacy of Choice:  [facility]

## 2018-07-16 MED ORDER — IBUPROFEN 600 MG PO TABS: 600.0000 mg | ORAL_TABLET | Freq: Four times a day (QID) | ORAL | 0 refills | Status: AC | PRN

## 2018-07-21 ENCOUNTER — Other Ambulatory Visit: Payer: Self-pay | Admitting: Family Medicine

## 2018-07-21 MED FILL — LOSARTAN POTASSIUM 100 MG T: 100 | 30 days supply | Qty: 30 | Fill #5

## 2018-07-21 MED FILL — OMEPRAZOLE 20 MG CAP: 20 | 30 days supply | Qty: 60 | Fill #5

## 2018-07-21 MED FILL — traMADol HCL 50 MG TABS: 50 | 30 days supply | Qty: 90 | Fill #2

## 2018-07-23 ENCOUNTER — Ambulatory Visit: Payer: Self-pay | Attending: Family Medicine

## 2018-07-23 MED FILL — IBUPROFEN 600 MG TABLET: 600 | 7 days supply | Qty: 30 | Fill #0

## 2018-08-04 ENCOUNTER — Other Ambulatory Visit: Payer: Self-pay

## 2018-08-04 ENCOUNTER — Emergency Department (HOSPITAL_COMMUNITY)
Admission: EM | Admit: 2018-08-04 | Discharge: 2018-08-04 | Disposition: A | Discharge status: Home / Self Care | Payer: Self-pay | Attending: Emergency Medicine | Admitting: Emergency Medicine

## 2018-08-04 ENCOUNTER — Emergency Department (HOSPITAL_COMMUNITY): Payer: Self-pay

## 2018-08-04 ENCOUNTER — Encounter (HOSPITAL_COMMUNITY): Payer: Self-pay | Admitting: *Deleted

## 2018-08-04 DIAGNOSIS — Z79899 Other long term (current) drug therapy: Secondary | ICD-10-CM | POA: Insufficient documentation

## 2018-08-04 DIAGNOSIS — G8929 Other chronic pain: Secondary | ICD-10-CM | POA: Insufficient documentation

## 2018-08-04 DIAGNOSIS — M545 Low back pain, unspecified: Secondary | ICD-10-CM

## 2018-08-04 DIAGNOSIS — I10 Essential (primary) hypertension: Secondary | ICD-10-CM | POA: Insufficient documentation

## 2018-08-04 DIAGNOSIS — M4309 Spondylolysis, multiple sites in spine: Secondary | ICD-10-CM | POA: Insufficient documentation

## 2018-08-04 DIAGNOSIS — M546 Pain in thoracic spine: Secondary | ICD-10-CM

## 2018-08-04 DIAGNOSIS — D571 Sickle-cell disease without crisis: Secondary | ICD-10-CM | POA: Insufficient documentation

## 2018-08-04 LAB — RETICULOCYTES
Immature Retic Fract: 21.1 % — ABNORMAL HIGH (ref 2.3–15.9)
RBC.: 3.72 MIL/uL — ABNORMAL LOW (ref 3.87–5.11)
Retic Count, Absolute: 91.5 10*3/uL (ref 19.0–186.0)
Retic Ct Pct: 2.5 % (ref 0.4–3.1)

## 2018-08-04 LAB — CBC WITH DIFFERENTIAL/PLATELET
Abs Immature Granulocytes: 0.02 10*3/uL (ref 0.00–0.07)
Basophils Absolute: 0 10*3/uL (ref 0.0–0.1)
Basophils Relative: 0 %
Eosinophils Absolute: 0.1 10*3/uL (ref 0.0–0.5)
Eosinophils Relative: 3 %
HCT: 26.4 % — ABNORMAL LOW (ref 36.0–46.0)
HEMOGLOBIN: 9.3 g/dL — AB (ref 12.0–15.0)
Immature Granulocytes: 0 %
Lymphocytes Relative: 25 %
Lymphs Abs: 1.3 10*3/uL (ref 0.7–4.0)
MCH: 25 pg — ABNORMAL LOW (ref 26.0–34.0)
MCHC: 35.2 g/dL (ref 30.0–36.0)
MCV: 71 fL — ABNORMAL LOW (ref 80.0–100.0)
MONO ABS: 0.3 10*3/uL (ref 0.1–1.0)
Monocytes Relative: 5 %
Neutro Abs: 3.4 10*3/uL (ref 1.7–7.7)
Neutrophils Relative %: 67 %
Platelets: 152 10*3/uL (ref 150–400)
RBC: 3.72 MIL/uL — ABNORMAL LOW (ref 3.87–5.11)
RDW: 17.5 % — ABNORMAL HIGH (ref 11.5–15.5)
WBC: 5.2 10*3/uL (ref 4.0–10.5)
nRBC: 0 % (ref 0.0–0.2)

## 2018-08-04 LAB — URINALYSIS, ROUTINE W REFLEX MICROSCOPIC
Bilirubin Urine: NEGATIVE
Glucose, UA: NEGATIVE mg/dL
Hgb urine dipstick: NEGATIVE
Ketones, ur: NEGATIVE mg/dL
Leukocytes, UA: NEGATIVE
Nitrite: NEGATIVE
PH: 7 (ref 5.0–8.0)
Protein, ur: NEGATIVE mg/dL
Specific Gravity, Urine: 1.01 (ref 1.005–1.030)

## 2018-08-04 LAB — COMPREHENSIVE METABOLIC PANEL
ALT: 14 U/L (ref 0–44)
AST: 19 U/L (ref 15–41)
Albumin: 3.6 g/dL (ref 3.5–5.0)
Alkaline Phosphatase: 69 U/L (ref 38–126)
Anion gap: 6 (ref 5–15)
BUN: 8 mg/dL (ref 6–20)
CO2: 27 mmol/L (ref 22–32)
Calcium: 8.7 mg/dL — ABNORMAL LOW (ref 8.9–10.3)
Chloride: 109 mmol/L (ref 98–111)
Creatinine, Ser: 0.84 mg/dL (ref 0.44–1.00)
GFR calc Af Amer: >60 mL/min (ref >60)
GFR calc non Af Amer: >60 mL/min (ref >60)
Glucose, Bld: 83 mg/dL (ref 70–99)
Potassium: 3.9 mmol/L (ref 3.5–5.1)
SODIUM: 142 mmol/L (ref 135–145)
Total Bilirubin: 1.2 mg/dL (ref 0.3–1.2)
Total Protein: 7.1 g/dL (ref 6.5–8.1)

## 2018-08-04 MED ORDER — NAPROXEN 500 MG PO TABS: 500.0000 mg | ORAL_TABLET | Freq: Two times a day (BID) | ORAL | 0 refills | Status: DC

## 2018-08-04 MED ORDER — KETOROLAC TROMETHAMINE 15 MG/ML IJ SOLN
15.0000 mg | INTRAMUSCULAR | Status: AC
  Administered 2018-08-04: 15 mg via INTRAVENOUS
  Filled 2018-08-04: qty 1

## 2018-08-04 MED ORDER — METHOCARBAMOL 500 MG PO TABS: 500.0000 mg | ORAL_TABLET | Freq: Four times a day (QID) | ORAL | 0 refills | Status: DC | PRN

## 2018-08-04 MED ORDER — HYDROCODONE-ACETAMINOPHEN 5-325 MG PO TABS: 1.0000 | ORAL_TABLET | Freq: Four times a day (QID) | ORAL | 0 refills | Status: DC | PRN

## 2018-08-04 MED ORDER — SODIUM CHLORIDE 0.45 % IV SOLN: INTRAVENOUS | Status: DC

## 2018-08-04 MED ORDER — DIPHENHYDRAMINE HCL 50 MG/ML IJ SOLN
25.0000 mg | Freq: Once | INTRAMUSCULAR | Status: AC
  Administered 2018-08-04: 25 mg via INTRAVENOUS
  Filled 2018-08-04: qty 1

## 2018-08-04 MED ORDER — HYDROMORPHONE HCL 1 MG/ML IJ SOLN
0.5000 mg | Freq: Once | INTRAMUSCULAR | Status: AC
  Administered 2018-08-04: 0.5 mg via INTRAVENOUS
  Filled 2018-08-04: qty 1

## 2018-08-04 NOTE — ED Triage Notes (Signed)
PT returned to room

## 2018-08-04 NOTE — ED Triage Notes (Signed)
PT refuses IV fluids

## 2018-08-04 NOTE — Discharge Instructions (Signed)
1.  Take naproxen twice daily for back pain.  Do not take any other medications in the NSAID category, this includes Aleve, Motrin, ibuprofen. 2.  Take Robaxin for a muscle relaxer up to 1 every 6 hours to help with pain.  You may add 1-2 Vicodin tablets every 6 hours if needed for additional pain control. 3.  You have a lot of arthritis in your back.  Follow-up with your family doctor and discuss possible referral to a spine specialist. 4.  Return to the emergency department if you develop any tingling weakness or numbness in the arms or the legs, difficulty urinating, worsening pain or any other concerning symptoms.

## 2018-08-04 NOTE — ED Triage Notes (Signed)
PT reports spine pain form  Sickle Cell . Pt reports pain has been present for one month.

## 2018-08-04 NOTE — ED Provider Notes (Signed)
[facility] EMERGENCY DEPARTMENT Provider Note   CSN: [id number] Arrival date & time: 08/04/18  0746     History   Chief Complaint Chief Complaint  Patient presents with  . Back Pain    HPI Kayla Woodard is a 63 y.o. female.  HPI Patient reports she has been having problems with back pain.  She reports it really encompasses her back from the base of her neck to the bottom of her spine.  Pain has both a aching quality as well as burning quality.  She has been dealing with this fairly consistently for a month now.  Patient had been seen at urgent care and tried a combination of tramadol and ibuprofen.  She reports that is usually helpful for pain control and sickle cell pain.  She reports this pain however is not responding to those medications and she cannot sleep at night and feels uncomfortable all the time.  She denies that she has any pain radiating into her legs.  She denies weakness or numbness of the legs.  She denies pain radiating to the arms.  Patient denies pain or burning with urination.  She denies shortness of breath or fever.  She reports she has felt so fatigued and weak and thinks maybe her anemia is worse.  Patient does not smoke or use alcohol. Past Medical History:  Diagnosis Date  . Anemia   . Back pain, chronic   . Hypertension   . Sickle cell anemia [facility])     Patient Active Problem List   Diagnosis Date Noted  . Elevated liver enzymes 09/24/2016  . Vertigo 08/27/2016  . Chronic pain 01/11/2016  . COUGH 11/15/2009  . OTALGIA 06/27/2009  . BACK PAIN, THORACIC REGION 06/27/2009  . HYPOKALEMIA 03/29/2009  . NECK SPRAIN AND STRAIN 03/29/2009  . Sickle cell anemia (HCC) 03/21/2009  . DEPRESSION 03/21/2009  . ESSENTIAL HYPERTENSION, BENIGN 03/21/2009  . ALLERGIC RHINITIS 03/21/2009  . GERD 03/21/2009  . LUMBAGO 03/21/2009  . CATARACT EXTRACTION, HX OF 03/21/2009    Past Surgical History:  Procedure Laterality Date  . CESAREAN SECTION        OB History   No obstetric history on file.      Home Medications    Prior to Admission medications   Medication Sig Start Date End Date Taking? Authorizing Provider  cetirizine (ZYRTEC) 10 MG tablet TAKE 1 TABLET BY MOUTH ONCE DAILY Patient taking differently: Take 10 mg by mouth daily.  05/21/18  Yes Hoy Register, MD  folic acid (FOLVITE) 1 MG tablet TAKE 2 TABLETS BY MOUTH DAILY Patient taking differently: Take 2 mg by mouth daily.  06/20/18  Yes Hoy Register, MD  ibuprofen (ADVIL,MOTRIN) 600 MG tablet Take 1 tablet (600 mg total) by mouth every 6 (six) hours as needed. Patient taking differently: Take 600 mg by mouth every 6 (six) hours as needed for moderate pain.  07/16/18  Yes Hoy Register, MD  losartan (COZAAR) 100 MG tablet Take 1 tablet (100 mg total) by mouth daily. 01/21/18  Yes Hoy Register, MD  omeprazole (PRILOSEC) 20 MG capsule Take 2 capsules (40 mg total) by mouth daily. 01/21/18  Yes Newlin, Enobong, MD  traMADol (ULTRAM) 50 MG tablet TAKE 1 TABLET BY MOUTH 3 (THREE) TIMES DAILY. Patient taking differently: Take 50 mg by mouth 3 (three) times daily.  05/21/18  Yes Hoy Register, MD  cyclobenzaprine (FLEXERIL) 5 MG tablet Take 1-2 tablets (5-10 mg total) by mouth 2 (two) times daily as needed.  Will cause drowsiness, do not drive/work after use. Patient not taking: Reported on 08/04/2018 07/03/18   Wieters, Fran Lowes C, PA-C  ergocalciferol (DRISDOL) 50000 units capsule Take 1 capsule (50,000 Units total) by mouth once a week. Patient not taking: Reported on 08/04/2018 02/06/17   Hoy Register, MD  HYDROcodone-acetaminophen (NORCO/VICODIN) 5-325 MG tablet Take 1-2 tablets by mouth every 6 (six) hours as needed for moderate pain or severe pain. 08/04/18   Arby Barrette, MD  methocarbamol (ROBAXIN) 500 MG tablet Take 1 tablet (500 mg total) by mouth every 6 (six) hours as needed for muscle spasms. 08/04/18   Arby Barrette, MD  naproxen (NAPROSYN) 500 MG tablet Take  1 tablet (500 mg total) by mouth 2 (two) times daily. 08/04/18   Arby Barrette, MD    Family History Family History  Problem Relation Age of Onset  . Hypertension Mother   . Sickle cell anemia Father     Social History Social History   Tobacco Use  . Smoking status: Never Smoker  . Smokeless tobacco: Never Used  Substance Use Topics  . Alcohol use: No  . Drug use: No     Allergies   Oxycodone and Vicodin [hydrocodone-acetaminophen]   Review of Systems Review of Systems 10 Systems reviewed and are negative for acute change except as noted in the HPI.   Physical Exam Updated Vital Signs BP (!) 150/85   Pulse 62   Temp 98.6 F (37 C) (Oral)   Resp 14   Ht 5\' 2"  (1.575 m)   Wt 78 kg   SpO2 100%   BMI 31.46 kg/m   Physical Exam Constitutional:      Comments: Patient is alert and nontoxic.  No respiratory distress.  She appears uncomfortable.  HENT:     Head: Normocephalic and atraumatic.     Nose: Nose normal.     Mouth/Throat:     Mouth: Mucous membranes are moist.  Eyes:     Extraocular Movements: Extraocular movements intact.  Neck:     Musculoskeletal: Neck supple.  Cardiovascular:     Rate and Rhythm: Normal rate and regular rhythm.     Pulses: Normal pulses.  Pulmonary:     Effort: Pulmonary effort is normal.     Breath sounds: Normal breath sounds.  Abdominal:     General: There is no distension.     Palpations: Abdomen is soft.     Tenderness: There is no abdominal tenderness. There is no guarding.  Musculoskeletal:     Comments: Normal visual inspection of the back.  Patient endorses some discomfort at about T1.  She then endorses discomfort at the very low back around the sacrum.  She also indicates area of discomfort to palpation in the right lateral thoracic.  No palpable anomalies.  No evident soft tissue changes.  Patient does not have peripheral edema.  Calves are soft and nontender.  Pulses are 2+ and symmetric of the feet.  Patient does  not endorse any pain with straight leg raise.  Patient has excellent strength to flex at the hips and push away with both lower extremities.  Neurological:     General: No focal deficit present.     Mental Status: She is oriented to person, place, and time.     Coordination: Coordination normal.  Psychiatric:        Mood and Affect: Mood normal.      ED Treatments / Results  Labs (all labs ordered are listed, but only abnormal results  are displayed) Labs Reviewed  CBC WITH DIFFERENTIAL/PLATELET - Abnormal; Notable for the following components:      Result Value   RBC 3.72 (*)    Hemoglobin 9.3 (*)    HCT 26.4 (*)    MCV 71.0 (*)    MCH 25.0 (*)    RDW 17.5 (*)    All other components within normal limits  RETICULOCYTES - Abnormal; Notable for the following components:   RBC. 3.72 (*)    Immature Retic Fract 21.1 (*)    All other components within normal limits  COMPREHENSIVE METABOLIC PANEL - Abnormal; Notable for the following components:   Calcium 8.7 (*)    All other components within normal limits  URINALYSIS, ROUTINE W REFLEX MICROSCOPIC - Abnormal; Notable for the following components:   Color, Urine AMBER (*)    APPearance CLOUDY (*)    All other components within normal limits    EKG None  Radiology Dg Chest 2 View  Result Date: 08/04/2018 CLINICAL DATA:  Bilateral axillary pain EXAM: CHEST - 2 VIEW COMPARISON:  04/30/2017 FINDINGS: Normal heart size. Aortic tortuosity. There is no edema, consolidation, effusion, or pneumothorax. Spondylosis as described on dedicated chest x-ray. IMPRESSION: No evidence of active disease. Electronically Signed   By: Marnee Spring M.D.   On: 08/04/2018 09:39   Dg Thoracic Spine 2 View  Result Date: 08/04/2018 CLINICAL DATA:  Chronic back and lumbar pain EXAM: THORACIC SPINE 2 VIEWS COMPARISON:  04/30/2017 chest x-ray FINDINGS: Spondylosis with multi-level bridging osteophyte. No evident fracture or lesion/erosion. No focal or  significant disc narrowing. IMPRESSION: 1. No acute finding. 2. Spondylosis with multi-level bridging osteophyte. Electronically Signed   By: Marnee Spring M.D.   On: 08/04/2018 09:18   Dg Lumbar Spine 2-3 Views  Result Date: 08/04/2018 CLINICAL DATA:  Lumbar spine pain EXAM: LUMBAR SPINE - 2-3 VIEW COMPARISON:  06/27/2010 FINDINGS: Spondylitic spurring at L2-3 and L3-4. Mild L1-2 disc narrowing. No evident fracture, bone lesion, or endplate erosion. IMPRESSION: Spondylosis and mild L1-2 degenerative disc narrowing. Electronically Signed   By: Marnee Spring M.D.   On: 08/04/2018 09:19    Procedures Procedures (including critical care time)  Medications Ordered in ED Medications  0.45 % sodium chloride infusion ( Intravenous Not Given 08/04/18 1121)  ketorolac (TORADOL) 15 MG/ML injection 15 mg (15 mg Intravenous Given 08/04/18 0935)  diphenhydrAMINE (BENADRYL) injection 25 mg (25 mg Intravenous Given 08/04/18 0935)  HYDROmorphone (DILAUDID) injection 0.5 mg (0.5 mg Intravenous Given 08/04/18 0935)     Initial Impression / Assessment and Plan / ED Course  I have reviewed the triage vital signs and the nursing notes.  Pertinent labs & imaging results that were available during my care of the patient were reviewed by me and considered in my medical decision making (see chart for details).  Clinical Course as of Aug 04 1220  Mon Aug 04, 2018  1124 Patient now giving UA. Ambulatory to bathroom without difficulty   [MP]    Clinical Course User Index [MP] Arby Barrette, MD   Patient's plain film x-rays show multiple levels of osteophyte formation with significant degenerative disease.  I suspect, with the patient having pain all the way from the top of her back to her lower back and sacral area, that this is the cause of her pain.  Patient has no neurologic dysfunction.  She does not experience paresthesias.  No sciatica.  Patient does have sickle cell anemia.  This appears to be stable.  Patient was treated conservatively with Toradol, half a milligram of Dilaudid and 25 mg of Benadryl for some itching associated with Dilaudid.  This gave the patient good pain relief she was comfortable and up and ambulatory without difficulty.  She is counseled on the need to follow-up with her primary care doctor and possibly referral to a spine specialist due to the extent of her disease.  Return precautions are reviewed.  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

## 2018-08-05 MED FILL — NAPROXEN 500 MG TABLET: 500 | 15 days supply | Qty: 30 | Fill #0

## 2018-08-05 MED FILL — HYDROCODON-APAP 5-325: 5-325 | 3 days supply | Qty: 20 | Fill #0

## 2018-08-05 MED FILL — METHOCARBAMOL 500 MG TABS: 500 | 5 days supply | Qty: 20 | Fill #0

## 2018-08-12 NOTE — Progress Notes (Signed)
Patient ID: Kayla Woodard, female   DOB: 08-12-1955, 63 y.o.   MRN: [id number]      Kayla Renkouele Seaman, is a 63 y.o. female  [id number]  [id number]  DOB - 08-12-1955  Subjective:  Chief Complaint and HPI: Kayla Renkouele Edds is a 63 y.o. female here today for a follow up visit After being seen in the ED for back pain 08/04/2018.  She needs referral.  This is a chronic condition.  Pain is daily and moderate to severe.  Also, has a cough bc out of cetirizine.  Doesn't feel sick.  No discolored mucus or fever.   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  I suspect, with the patient having pain all the way from the top of her back to her lower back and sacral area, that this is the cause of her pain.  Patient has no neurologic dysfunction.  She does not experience paresthesias.  No sciatica.  Patient does have sickle cell anemia.  This appears to be stable.  Patient was treated conservatively with Toradol, half a milligram of Dilaudid and 25 mg of Benadryl for some itching associated with Dilaudid.  This gave the patient good pain relief she was comfortable and up and ambulatory without difficulty.  She is counseled on the need to follow-up with her primary care doctor and possibly referral to a spine specialist due to the extent of her disease.  Return precautions are reviewed  ED/Hospital notes reviewed.    ROS:   Constitutional:  No f/c, No night sweats, No unexplained weight loss. EENT:  No vision changes, No blurry vision, No hearing changes. No mouth, throat, or ear problems.  Respiratory: + cough, No SOB Cardiac: No CP, no palpitations GI:  No abd pain, No N/V/D. GU: No Urinary s/sx Musculoskeletal: No joint pain Neuro: No headache, no dizziness, no motor weakness.  Skin: No rash Endocrine:  No polydipsia. No polyuria.  Psych: Denies SI/HI  No problems updated.  ALLERGIES: Allergies  Allergen  Reactions  . Oxycodone Itching  . Vicodin [Hydrocodone-Acetaminophen] Itching    PAST MEDICAL HISTORY: Past Medical History:  Diagnosis Date  . Anemia   . Back pain, chronic   . Hypertension   . Sickle cell anemia (HCC)     MEDICATIONS AT HOME: Prior to Admission medications   Medication Sig Start Date End Date Taking? Authorizing Provider  cetirizine (ZYRTEC) 10 MG tablet Take 1 tablet (10 mg total) by mouth daily. 08/13/18   Anders SimmondsMcClung, Amberlee Garvey M, PA-C  ergocalciferol (DRISDOL) 50000 units capsule Take 1 capsule (50,000 Units total) by mouth once a week. Patient not taking: Reported on 08/04/2018 02/06/17   Hoy RegisterNewlin, Enobong, MD  folic acid (FOLVITE) 1 MG tablet TAKE 2 TABLETS BY MOUTH DAILY Patient taking differently: Take 2 mg by mouth daily.  06/20/18   Hoy RegisterNewlin, Enobong, MD  ibuprofen (ADVIL,MOTRIN) 600 MG tablet Take 1 tablet (600 mg total) by mouth every 6 (six) hours as needed. Patient taking differently: Take 600 mg by mouth every 6 (six) hours as needed for moderate pain.  07/16/18   Hoy RegisterNewlin, Enobong, MD  losartan (COZAAR) 100 MG tablet Take 1 tablet (100 mg total) by mouth daily. 01/21/18   Hoy RegisterNewlin, Enobong, MD  methocarbamol (ROBAXIN) 500 MG tablet 1-2 every 8 hours as needed for muscle spasm 08/13/18   Georgian CoMcClung, Aeron Donaghey M, PA-C  naproxen (NAPROSYN) 500 MG tablet Take 1 tablet (500 mg total) by mouth 2 (two) times daily  with a meal. 08/13/18   Lynkin Saini, Marzella SchleinAngela M, PA-C  omeprazole (PRILOSEC) 20 MG capsule Take 2 capsules (40 mg total) by mouth daily. 01/21/18   Hoy RegisterNewlin, Enobong, MD  traMADol (ULTRAM) 50 MG tablet TAKE 1 TABLET BY MOUTH 3 (THREE) TIMES DAILY. Patient taking differently: Take 50 mg by mouth 3 (three) times daily.  05/21/18   Hoy RegisterNewlin, Enobong, MD     Objective:  EXAM:   Vitals:   08/13/18 0823  BP: (!) 144/86  Pulse: 95  Resp: 16  Temp: 98.5 F (36.9 C)  TempSrc: Oral  SpO2: 100%  Weight: 172 lb 12.8 oz (78.4 kg)  Height: 5\' 1"  (1.549 m)    General appearance :  A&OX3. NAD. Non-toxic-appearing HEENT: Atraumatic and Normocephalic.  PERRLA. EOM intact.  TM clear B. Mouth-MMM, post pharynx WNL w/o erythema, No PND. Neck: supple, no JVD. No cervical lymphadenopathy. No thyromegaly Chest/Lungs:  Breathing-non-labored, Good air entry bilaterally, breath sounds normal without rales, rhonchi, or wheezing  CVS: S1 S2 regular, no murmurs, gallops, rubs  Extremities: Bilateral Lower Ext shows no edema, both legs are warm to touch with = pulse throughout Neurology:  CN II-XII grossly intact, Non focal.   Psych:  TP linear. J/I WNL. Normal speech. Appropriate eye contact and affect.  Skin:  No Rash  Data Review No results found for: HGBA1C   Assessment & Plan   1. BACK PAIN, THORACIC REGION Chronic, no red flags - naproxen (NAPROSYN) 500 MG tablet; Take 1 tablet (500 mg total) by mouth 2 (two) times daily with a meal.  Dispense: 60 tablet; Refill: 1 - methocarbamol (ROBAXIN) 500 MG tablet; 1-2 every 8 hours as needed for muscle spasm  Dispense: 90 tablet; Refill: 0 - Ambulatory referral to Orthopedic Surgery  2. Essential hypertension Uncontrolled-but didn't take meds today.  Take meds and check BP OOO  3. Chronic bilateral low back pain without sciatica No red flags - naproxen (NAPROSYN) 500 MG tablet; Take 1 tablet (500 mg total) by mouth 2 (two) times daily with a meal.  Dispense: 60 tablet; Refill: 1 - methocarbamol (ROBAXIN) 500 MG tablet; 1-2 every 8 hours as needed for muscle spasm  Dispense: 90 tablet; Refill: 0 - Ambulatory referral to Orthopedic Surgery  4. Cough  - cetirizine (ZYRTEC) 10 MG tablet; Take 1 tablet (10 mg total) by mouth daily.  Dispense: 30 tablet; Refill: 11  5. Encounter for examination following treatment at hospital improving   Patient have been counseled extensively about nutrition and exercise  Return in about 3 months (around 11/11/2018) for Dr Alvis LemmingsNewlin for htn and labs.  The patient was given clear instructions  to go to ER or return to medical center if symptoms don't improve, worsen or new problems develop. The patient verbalized understanding. The patient was told to call to get lab results if they haven't heard anything in the next week.     Georgian CoAngela Lyda Colcord, PA-C [facility] [place], [place] [phone]   08/13/2018, 8:43 AM

## 2018-08-13 ENCOUNTER — Other Ambulatory Visit: Payer: Self-pay | Admitting: Family Medicine

## 2018-08-13 ENCOUNTER — Ambulatory Visit: Payer: Self-pay | Attending: Family Medicine | Admitting: Physician Assistant

## 2018-08-13 VITALS — BP 144/86 | HR 95 | Temp 98.5°F | Resp 16 | Ht 61.0 in | Wt 172.8 lb

## 2018-08-13 DIAGNOSIS — R059 Cough, unspecified: Secondary | ICD-10-CM

## 2018-08-13 DIAGNOSIS — G8929 Other chronic pain: Secondary | ICD-10-CM

## 2018-08-13 DIAGNOSIS — Z09 Encounter for follow-up examination after completed treatment for conditions other than malignant neoplasm: Secondary | ICD-10-CM

## 2018-08-13 DIAGNOSIS — R05 Cough: Secondary | ICD-10-CM

## 2018-08-13 DIAGNOSIS — D571 Sickle-cell disease without crisis: Secondary | ICD-10-CM

## 2018-08-13 DIAGNOSIS — M546 Pain in thoracic spine: Secondary | ICD-10-CM

## 2018-08-13 DIAGNOSIS — M545 Low back pain, unspecified: Secondary | ICD-10-CM

## 2018-08-13 DIAGNOSIS — I10 Essential (primary) hypertension: Secondary | ICD-10-CM

## 2018-08-13 MED ORDER — NAPROXEN 500 MG PO TABS: 500.0000 mg | ORAL_TABLET | Freq: Two times a day (BID) | ORAL | 1 refills | Status: AC

## 2018-08-13 MED ORDER — METHOCARBAMOL 500 MG PO TABS: ORAL_TABLET | ORAL | 0 refills | Status: AC

## 2018-08-13 MED ORDER — CETIRIZINE HCL 10 MG PO TABS: 10.0000 mg | ORAL_TABLET | Freq: Every day | ORAL | 11 refills | Status: AC

## 2018-08-13 MED FILL — METHOCARBAMOL 500 MG TABS: 500 | 15 days supply | Qty: 90 | Fill #0

## 2018-08-13 MED FILL — ?CETIRIZINE HCL 10 MG TABLE: 10 | 30 days supply | Qty: 30 | Fill #0

## 2018-08-13 NOTE — Patient Instructions (Signed)
Check blood pressure 3-5 times/week and record and bring to next visit 

## 2018-08-15 MED FILL — ?OMEPRAZOLE 20 MG CAPSULE D: 20 | 30 days supply | Qty: 60 | Fill #6

## 2018-08-15 MED FILL — LOSARTAN POTASSIUM 100 MG T: 100 | 30 days supply | Qty: 30 | Fill #6

## 2018-08-15 NOTE — Telephone Encounter (Signed)
Pt last seen: 08/13/18 Next appt: n/a Last RX written on: 05/21/18 Date of original fill: 05/21/18 Date of refill(s): 06/18/18, 07/21/18  The following controlled substances were also filled during this time Hydrocodone w/ APAP 5/325mg  #20/3ds written by Dr. Donnald Garre on 08/04/18, filled on 08/05/18.    Please refill if appropriate.

## 2018-08-18 MED FILL — traMADol HCL 50 MG TABS: 50 | 30 days supply | Qty: 90 | Fill #0

## 2018-10-14 MED FILL — traMADol HCL 50 MG TABS: 50 | 30 days supply | Qty: 90 | Fill #1

## 2018-10-15 ENCOUNTER — Other Ambulatory Visit: Payer: Self-pay | Admitting: Family Medicine

## 2018-10-15 DIAGNOSIS — I10 Essential (primary) hypertension: Secondary | ICD-10-CM

## 2018-10-15 DIAGNOSIS — K219 Gastro-esophageal reflux disease without esophagitis: Secondary | ICD-10-CM

## 2018-10-15 MED FILL — LOSARTAN POTASSIUM 100 MG T: 100 | 30 days supply | Qty: 30 | Fill #0

## 2018-10-15 MED FILL — ?OMEPRAZOLE 20 MG CAPSULE D: 20 | 90 days supply | Qty: 180 | Fill #0

## 2018-11-17 ENCOUNTER — Other Ambulatory Visit: Payer: Self-pay | Admitting: Family Medicine

## 2018-11-17 DIAGNOSIS — K219 Gastro-esophageal reflux disease without esophagitis: Secondary | ICD-10-CM

## 2018-11-17 MED FILL — traMADol HCL 50 MG TABS: 50 | 30 days supply | Qty: 90 | Fill #2

## 2018-11-17 MED FILL — LOSARTAN POTASSIUM 100 MG T: 100 | 30 days supply | Qty: 30 | Fill #1

## 2018-11-17 MED FILL — ?CETIRIZINE HCL 10 MG TABLE: 10 | 30 days supply | Qty: 30 | Fill #0

## 2018-12-16 ENCOUNTER — Other Ambulatory Visit: Payer: Self-pay | Admitting: Family Medicine

## 2018-12-16 DIAGNOSIS — D571 Sickle-cell disease without crisis: Secondary | ICD-10-CM

## 2018-12-16 MED FILL — ?CETIRIZINE HCL 10 MG TABLE: 10 | 30 days supply | Qty: 30 | Fill #1

## 2018-12-16 MED FILL — ?OMEPRAZOLE 20 MG CAPSULE D: 20 | 30 days supply | Qty: 60 | Fill #0

## 2018-12-16 MED FILL — LOSARTAN POTASSIUM 100 MG T: 100 | 30 days supply | Qty: 30 | Fill #2

## 2018-12-18 MED FILL — traMADol HCL 50 MG TABS: 50 | 30 days supply | Qty: 90 | Fill #0

## 2019-01-05 ENCOUNTER — Telehealth: Payer: Self-pay | Admitting: Family Medicine

## 2019-01-05 NOTE — Telephone Encounter (Signed)
Attempted to reach patient to schedule appt. No answer unable to reach.

## 2019-01-05 NOTE — Telephone Encounter (Signed)
Patient states she is feeling sick and has anemia and believes it is bad and has a hard time getting up. Please follow up.

## 2019-01-05 NOTE — Telephone Encounter (Signed)
Unable to reach, no answer. Attempt x 2.    Will need to schedule an appointment for an office visit

## 2019-01-19 ENCOUNTER — Other Ambulatory Visit: Payer: Self-pay | Admitting: Family Medicine

## 2019-01-19 DIAGNOSIS — I10 Essential (primary) hypertension: Secondary | ICD-10-CM

## 2019-01-19 MED FILL — ?CETIRIZINE HCL 10 MG TABLE: 10 | 30 days supply | Qty: 30 | Fill #2

## 2019-01-19 MED FILL — traMADol HCL 50 MG TABS: 50 | 30 days supply | Qty: 90 | Fill #1

## 2019-01-19 MED FILL — ?OMEPRAZOLE 20 MG CAPSULE D: 20 | 30 days supply | Qty: 60 | Fill #1

## 2019-01-23 ENCOUNTER — Other Ambulatory Visit: Payer: Self-pay | Admitting: Family Medicine

## 2019-01-23 DIAGNOSIS — I10 Essential (primary) hypertension: Secondary | ICD-10-CM

## 2019-01-27 ENCOUNTER — Telehealth: Payer: Self-pay | Admitting: Family Medicine

## 2019-01-27 NOTE — Telephone Encounter (Signed)
Called Pt since she left a VM on my ext. Asking for a refill and appt with the PCP, unable to LVM since no answer an no VM

## 2019-02-23 ENCOUNTER — Other Ambulatory Visit: Payer: Self-pay | Admitting: Family Medicine

## 2019-02-23 DIAGNOSIS — I10 Essential (primary) hypertension: Secondary | ICD-10-CM

## 2019-02-23 MED FILL — traMADol HCL 50 MG TABS: 50 | 30 days supply | Qty: 90 | Fill #2

## 2019-02-23 MED FILL — ?CETIRIZINE HCL 10 MG TABLE: 10 | 30 days supply | Qty: 30 | Fill #1

## 2019-02-23 MED FILL — LOSARTAN POTASSIUM 100 MG T: 100 | 30 days supply | Qty: 30 | Fill #0

## 2019-02-23 MED FILL — ?OMEPRAZOLE 20MG CAP DR: 20 | 30 days supply | Qty: 60 | Fill #2

## 2019-02-23 NOTE — Telephone Encounter (Signed)
Pt has appointment on 8/26. Will need to keep that appointment for more refills. 1 month supply sent.

## 2019-02-25 ENCOUNTER — Other Ambulatory Visit: Payer: Self-pay

## 2019-02-25 ENCOUNTER — Ambulatory Visit: Payer: Self-pay | Attending: Family Medicine | Admitting: Family Medicine

## 2019-04-01 ENCOUNTER — Encounter (HOSPITAL_COMMUNITY): Payer: Self-pay | Admitting: Emergency Medicine

## 2019-04-01 ENCOUNTER — Ambulatory Visit (HOSPITAL_COMMUNITY)
Admission: EM | Admit: 2019-04-01 | Discharge: 2019-04-01 | Disposition: A | Discharge status: Home / Self Care | Payer: HRSA Program | Attending: Family Medicine | Admitting: Family Medicine

## 2019-04-01 ENCOUNTER — Other Ambulatory Visit: Payer: Self-pay

## 2019-04-01 DIAGNOSIS — I10 Essential (primary) hypertension: Secondary | ICD-10-CM

## 2019-04-01 DIAGNOSIS — Z1159 Encounter for screening for other viral diseases: Secondary | ICD-10-CM

## 2019-04-01 DIAGNOSIS — Z20828 Contact with and (suspected) exposure to other viral communicable diseases: Secondary | ICD-10-CM | POA: Diagnosis not present

## 2019-04-01 DIAGNOSIS — Z0189 Encounter for other specified special examinations: Secondary | ICD-10-CM

## 2019-04-01 MED FILL — traMADol HCL 50 MG TABS: 50 | 30 days supply | Qty: 90 | Fill #2

## 2019-04-01 MED FILL — ?CETIRIZINE HCL 10 MG TABLE: 10 | 30 days supply | Qty: 30 | Fill #1

## 2019-04-01 MED FILL — LOSARTAN POTASSIUM 100 MG T: 100 | 30 days supply | Qty: 30 | Fill #0

## 2019-04-01 MED FILL — ?OMEPRAZOLE 20MG CAP DR: 20 | 30 days supply | Qty: 60 | Fill #2

## 2019-04-01 NOTE — ED Triage Notes (Signed)
Pt here for Covid screening for travel to [place] tomorrow.  Pt has been made aware that we cannot guarantee that she will get her results back before her flight tomorrow.  Pt flies out of [employer] afternoon, leaving [place] at 1200.

## 2019-04-01 NOTE — Discharge Instructions (Addendum)
If your Covid-19 test is positive, you will get a phone call from [facility] regarding your results. If your Covid-19 test is negative, you will NOT get a phone call from [facility] with your results. You may view your results on MyChart. If you do not have a MyChart account, sign up instructions are in your discharge papers. ° °

## 2019-04-01 NOTE — ED Provider Notes (Signed)
[facility]   [id number] 04/01/19 Arrival Time: 0950  ASSESSMENT & PLAN:  1. Patient request for diagnostic testing     COVID-19 screening test sent.  Follow-up Information    Hoy Register, MD.   Specialty: Family Medicine Why: As needed. Contact information: [address] [place] [address] [phone]            Discharge Instructions     If your Covid-19 test is positive, you will get a phone call from [facility] regarding your results. If your Covid-19 test is negative, you will NOT get a phone call from [facility] with your results. You may view your results on MyChart. If you do not have a MyChart account, sign up instructions are in your discharge papers.      Reviewed expectations re: course of current medical issues. Questions answered. Outlined signs and symptoms indicating need for more acute intervention. Patient verbalized understanding. After Visit Summary given.   SUBJECTIVE: History from: patient. Kayla Woodard is a 63 y.o. female who requests COVID-19 testing. She is supposed to fly to [place] tomorrow and reports that [place] requires negative testing within 5 days of arrival. She tested negative 14 days ago. Feeling well.  ROS: As per HPI.   OBJECTIVE:  Vitals:   04/01/19 1026  BP: (!) 169/88  Pulse: 80  Resp: 18  Temp: 98.8 F (37.1 C)  TempSrc: Oral  SpO2: 99%    General appearance: alert; no distress Eyes: PERRLA; EOMI; conjunctiva normal HENT: [place]; AT; nasal mucosa normal; oral mucosa normal Neck: supple  Lungs: clear to auscultation bilaterally; unlabored Heart: regular rate and rhythm Skin: warm and dry Psychological: alert and cooperative; normal mood and affect  Labs:  Labs Reviewed  NOVEL CORONAVIRUS, NAA (HOSP ORDER, SEND-OUT TO REF LAB; TAT 18-24 HRS)     Allergies  Allergen Reactions  . Oxycodone Itching  . Vicodin [Hydrocodone-Acetaminophen] Itching    Past Medical History:   Diagnosis Date  . Anemia   . Back pain, chronic   . Hypertension   . Sickle cell anemia (HCC)    Social History   Socioeconomic History  . Marital status: Married    Spouse name: Not on file  . Number of children: Not on file  . Years of education: Not on file  . Highest education level: Not on file  Occupational History  . Not on file  Social Needs  . Financial resource strain: Not on file  . Food insecurity    Worry: Not on file    Inability: Not on file  . Transportation needs    Medical: Not on file    Non-medical: Not on file  Tobacco Use  . Smoking status: Never Smoker  . Smokeless tobacco: Never Used  Substance and Sexual Activity  . Alcohol use: No  . Drug use: No  . Sexual activity: Never  Lifestyle  . Physical activity    Days per week: Not on file    Minutes per session: Not on file  . Stress: Not on file  Relationships  . Social Musician on phone: Not on file    Gets together: Not on file    Attends religious service: Not on file    Active member of club or organization: Not on file    Attends meetings of clubs or organizations: Not on file    Relationship status: Not on file  . Intimate partner violence    Fear of current  or ex partner: Not on file    Emotionally abused: Not on file    Physically abused: Not on file    Forced sexual activity: Not on file  Other Topics Concern  . Not on file  Social History Narrative  . Not on file   Family History  Problem Relation Age of Onset  . Hypertension Mother   . Sickle cell anemia Father    Past Surgical History:  Procedure Laterality Date  . CESAREAN SECTION       Vanessa Kick, MD 04/01/19 1047

## 2019-04-03 LAB — NOVEL CORONAVIRUS, NAA (HOSP ORDER, SEND-OUT TO REF LAB; TAT 18-24 HRS): SARS-CoV-2, NAA: NOT DETECTED

## 2019-04-06 ENCOUNTER — Encounter (HOSPITAL_COMMUNITY): Payer: Self-pay

## 2019-04-06 ENCOUNTER — Other Ambulatory Visit: Payer: Self-pay

## 2019-04-06 ENCOUNTER — Emergency Department (HOSPITAL_COMMUNITY)
Admission: EM | Admit: 2019-04-06 | Discharge: 2019-04-06 | Disposition: A | Discharge status: Home / Self Care | Payer: Self-pay | Attending: Emergency Medicine | Admitting: Emergency Medicine

## 2019-04-06 DIAGNOSIS — D5700 Hb-SS disease with crisis, unspecified: Secondary | ICD-10-CM | POA: Insufficient documentation

## 2019-04-06 DIAGNOSIS — Z79899 Other long term (current) drug therapy: Secondary | ICD-10-CM | POA: Insufficient documentation

## 2019-04-06 DIAGNOSIS — Z20828 Contact with and (suspected) exposure to other viral communicable diseases: Secondary | ICD-10-CM | POA: Insufficient documentation

## 2019-04-06 DIAGNOSIS — I10 Essential (primary) hypertension: Secondary | ICD-10-CM | POA: Insufficient documentation

## 2019-04-06 LAB — RETICULOCYTES
Immature Retic Fract: 25.4 % — ABNORMAL HIGH (ref 2.3–15.9)
RBC.: 3.96 MIL/uL (ref 3.87–5.11)
Retic Count, Absolute: 85.1 10*3/uL (ref 19.0–186.0)
Retic Ct Pct: 2.2 % (ref 0.4–3.1)

## 2019-04-06 LAB — CBC WITH DIFFERENTIAL/PLATELET
Abs Immature Granulocytes: 0.01 10*3/uL (ref 0.00–0.07)
Basophils Absolute: 0 10*3/uL (ref 0.0–0.1)
Basophils Relative: 1 %
Eosinophils Absolute: 0.1 10*3/uL (ref 0.0–0.5)
Eosinophils Relative: 3 %
HCT: 28 % — ABNORMAL LOW (ref 36.0–46.0)
Hemoglobin: 10.4 g/dL — ABNORMAL LOW (ref 12.0–15.0)
Immature Granulocytes: 0 %
Lymphocytes Relative: 30 %
Lymphs Abs: 1.3 10*3/uL (ref 0.7–4.0)
MCH: 26.3 pg (ref 26.0–34.0)
MCHC: 37.1 g/dL — ABNORMAL HIGH (ref 30.0–36.0)
MCV: 70.7 fL — ABNORMAL LOW (ref 80.0–100.0)
Monocytes Absolute: 0.3 10*3/uL (ref 0.1–1.0)
Monocytes Relative: 6 %
Neutro Abs: 2.6 10*3/uL (ref 1.7–7.7)
Neutrophils Relative %: 60 %
Platelets: 149 10*3/uL — ABNORMAL LOW (ref 150–400)
RBC: 3.96 MIL/uL (ref 3.87–5.11)
RDW: 17.3 % — ABNORMAL HIGH (ref 11.5–15.5)
WBC: 4.3 10*3/uL (ref 4.0–10.5)
nRBC: 0 % (ref 0.0–0.2)

## 2019-04-06 LAB — COMPREHENSIVE METABOLIC PANEL
ALT: 17 U/L (ref 0–44)
AST: 17 U/L (ref 15–41)
Albumin: 3.6 g/dL (ref 3.5–5.0)
Alkaline Phosphatase: 78 U/L (ref 38–126)
Anion gap: 6 (ref 5–15)
BUN: 9 mg/dL (ref 8–23)
CO2: 26 mmol/L (ref 22–32)
Calcium: 8.6 mg/dL — ABNORMAL LOW (ref 8.9–10.3)
Chloride: 108 mmol/L (ref 98–111)
Creatinine, Ser: 0.82 mg/dL (ref 0.44–1.00)
GFR calc Af Amer: >60 mL/min (ref >60)
GFR calc non Af Amer: >60 mL/min (ref >60)
Glucose, Bld: 124 mg/dL — ABNORMAL HIGH (ref 70–99)
Potassium: 3.7 mmol/L (ref 3.5–5.1)
Sodium: 140 mmol/L (ref 135–145)
Total Bilirubin: 1.7 mg/dL — ABNORMAL HIGH (ref 0.3–1.2)
Total Protein: 6.9 g/dL (ref 6.5–8.1)

## 2019-04-06 MED ORDER — DIPHENHYDRAMINE HCL 25 MG PO CAPS: 25.0000 mg | ORAL_CAPSULE | ORAL | Status: DC | PRN

## 2019-04-06 MED ORDER — ONDANSETRON HCL 4 MG/2ML IJ SOLN: 4.0000 mg | INTRAMUSCULAR | Status: DC | PRN

## 2019-04-06 MED ORDER — HYDROMORPHONE HCL 1 MG/ML IJ SOLN: 1.0000 mg | INTRAMUSCULAR | Status: DC

## 2019-04-06 MED ORDER — HYDROMORPHONE HCL 1 MG/ML IJ SOLN: 1.0000 mg | INTRAMUSCULAR | Status: AC

## 2019-04-06 MED ORDER — KETOROLAC TROMETHAMINE 15 MG/ML IJ SOLN
15.0000 mg | INTRAMUSCULAR | Status: AC
  Administered 2019-04-06: 15 mg via INTRAVENOUS
  Filled 2019-04-06: qty 1

## 2019-04-06 MED ORDER — HYDROMORPHONE HCL 1 MG/ML IJ SOLN
0.5000 mg | INTRAMUSCULAR | Status: AC
  Administered 2019-04-06: 10:00:00 0.5 mg via INTRAVENOUS
  Filled 2019-04-06: qty 1

## 2019-04-06 MED ORDER — HYDROMORPHONE HCL 1 MG/ML IJ SOLN: 0.5000 mg | INTRAMUSCULAR | Status: AC

## 2019-04-06 MED ORDER — SODIUM CHLORIDE 0.45 % IV SOLN
INTRAVENOUS | Status: DC
  Administered 2019-04-06: 10:00:00 via INTRAVENOUS

## 2019-04-06 NOTE — ED Triage Notes (Signed)
Pt history of sickle cell anemia presents with weakness and dizziness and pain in legs and chest pain.  Pt states this is the same symptoms she has when she has a sickle cell crisis.  Pt is Alert and oriented and ambulating.  Pain is 7/10 in triage.

## 2019-04-06 NOTE — ED Provider Notes (Signed)
[facility] EMERGENCY DEPARTMENT Provider Note   CSN: [id number] Arrival date & time: 04/06/19  6269     History   Chief Complaint Chief Complaint  Patient presents with  . Anemia    sickle cell    HPI Kayla Woodard is a 63 y.o. female with history of hypertension, sickle cell anemia who presents with chest pain and bilateral leg pain that she states is typical for her normal sickle cell pain.  She has been taking her home tramadol without relief.  Patient denies any fever, cough, shortness of breath.  She states she has had chest pain in the past with her normal sickle cell crises.  Patient denies any abdominal pain, nausea, vomiting, injury or trauma.  [facility]  She only takes tramadol for her pain.  Patient also reports she is trying to go to [place] and needs a COVID-19 test within 5 days prior to leaving.     HPI  Past Medical History:  Diagnosis Date  . Anemia   . Back pain, chronic   . Hypertension   . Sickle cell anemia [facility])     Patient Active Problem List   Diagnosis Date Noted  . Elevated liver enzymes 09/24/2016  . Vertigo 08/27/2016  . Chronic pain 01/11/2016  . COUGH 11/15/2009  . OTALGIA 06/27/2009  . BACK PAIN, THORACIC REGION 06/27/2009  . HYPOKALEMIA 03/29/2009  . NECK SPRAIN AND STRAIN 03/29/2009  . Sickle cell anemia (HCC) 03/21/2009  . DEPRESSION 03/21/2009  . ESSENTIAL HYPERTENSION, BENIGN 03/21/2009  . ALLERGIC RHINITIS 03/21/2009  . GERD 03/21/2009  . LUMBAGO 03/21/2009  . CATARACT EXTRACTION, HX OF 03/21/2009    Past Surgical History:  Procedure Laterality Date  . CESAREAN SECTION       OB History   No obstetric history on file.      Home Medications    Prior to Admission medications   Medication Sig Start Date End Date Taking? Authorizing Provider  cetirizine (ZYRTEC) 10 MG tablet Take 1 tablet (10 mg total) by mouth daily. 08/13/18   Anders Simmonds, PA-C   ergocalciferol (DRISDOL) 50000 units capsule Take 1 capsule (50,000 Units total) by mouth once a week. Patient not taking: Reported on 08/04/2018 02/06/17   Hoy Register, MD  folic acid (FOLVITE) 1 MG tablet TAKE 2 TABLETS BY MOUTH DAILY Patient taking differently: Take 2 mg by mouth daily.  06/20/18   Hoy Register, MD  ibuprofen (ADVIL,MOTRIN) 600 MG tablet Take 1 tablet (600 mg total) by mouth every 6 (six) hours as needed. Patient taking differently: Take 600 mg by mouth every 6 (six) hours as needed for moderate pain.  07/16/18   Hoy Register, MD  losartan (COZAAR) 100 MG tablet TAKE 1 TABLET BY MOUTH DAILY. 02/23/19   Hoy Register, MD  methocarbamol (ROBAXIN) 500 MG tablet 1-2 every 8 hours as needed for muscle spasm 08/13/18   Georgian Co M, PA-C  naproxen (NAPROSYN) 500 MG tablet Take 1 tablet (500 mg total) by mouth 2 (two) times daily with a meal. 08/13/18   McClung, Marzella Schlein, PA-C  omeprazole (PRILOSEC) 20 MG capsule TAKE 2 CAPSULES BY MOUTH DAILY. 11/17/18   Hoy Register, MD  traMADol (ULTRAM) 50 MG tablet TAKE 1 TABLET BY MOUTH 3 (THREE) TIMES DAILY. 12/18/18   Hoy Register, MD    Family History Family History  Problem Relation Age of Onset  . Hypertension Mother   . Sickle cell anemia Father  Social History Social History   Tobacco Use  . Smoking status: Never Smoker  . Smokeless tobacco: Never Used  Substance Use Topics  . Alcohol use: No  . Drug use: No     Allergies   Oxycodone and Vicodin [hydrocodone-acetaminophen]   Review of Systems Review of Systems  Constitutional: Negative for chills and fever.  HENT: Negative for facial swelling and sore throat.   Respiratory: Negative for shortness of breath.   Cardiovascular: Positive for chest pain.  Gastrointestinal: Negative for abdominal pain, nausea and vomiting.  Genitourinary: Negative for dysuria.  Musculoskeletal: Positive for myalgias. Negative for back pain.  Skin: Negative for rash  and wound.  Neurological: Negative for headaches.  Psychiatric/Behavioral: The patient is not nervous/anxious.      Physical Exam Updated Vital Signs BP 139/84   Pulse 77   Temp 98.1 F (36.7 C) (Oral)   Resp 17   Ht 5\' 4"  (1.626 m)   Wt 72 kg   SpO2 100%   BMI 27.25 kg/m   Physical Exam Vitals signs and nursing note reviewed.  Constitutional:      General: She is not in acute distress.    Appearance: She is well-developed. She is not diaphoretic.  HENT:     Head: Normocephalic and atraumatic.     Mouth/Throat:     Pharynx: No oropharyngeal exudate.  Eyes:     General: No scleral icterus.       Right eye: No discharge.        Left eye: No discharge.     Conjunctiva/sclera: Conjunctivae normal.     Pupils: Pupils are equal, round, and reactive to light.  Neck:     Musculoskeletal: Normal range of motion and neck supple.     Thyroid: No thyromegaly.  Cardiovascular:     Rate and Rhythm: Normal rate and regular rhythm.     Heart sounds: Normal heart sounds. No murmur. No friction rub. No gallop.   Pulmonary:     Effort: Pulmonary effort is normal. No respiratory distress.     Breath sounds: Normal breath sounds. No stridor. No wheezing or rales.  Chest:     Chest wall: No tenderness.  Abdominal:     General: Bowel sounds are normal. There is no distension.     Palpations: Abdomen is soft.     Tenderness: There is no abdominal tenderness. There is no guarding or rebound.  Lymphadenopathy:     Cervical: No cervical adenopathy.  Skin:    General: Skin is warm and dry.     Coloration: Skin is not pale.     Findings: No rash.  Neurological:     Mental Status: She is alert.     Coordination: Coordination normal.      ED Treatments / Results  Labs (all labs ordered are listed, but only abnormal results are displayed) Labs Reviewed  CBC WITH DIFFERENTIAL/PLATELET - Abnormal; Notable for the following components:      Result Value   Hemoglobin 10.4 (*)    HCT  28.0 (*)    MCV 70.7 (*)    MCHC 37.1 (*)    RDW 17.3 (*)    Platelets 149 (*)    All other components within normal limits  RETICULOCYTES - Abnormal; Notable for the following components:   Immature Retic Fract 25.4 (*)    All other components within normal limits  COMPREHENSIVE METABOLIC PANEL - Abnormal; Notable for the following components:   Glucose, Bld 124 (*)  Calcium 8.6 (*)    Total Bilirubin 1.7 (*)    All other components within normal limits  NOVEL CORONAVIRUS, NAA (HOSP ORDER, SEND-OUT TO REF LAB; TAT 18-24 HRS)    EKG None  Radiology No results found.  Procedures Procedures (including critical care time)  Medications Ordered in ED Medications  HYDROmorphone (DILAUDID) injection 1 mg (has no administration in time range)    Or  HYDROmorphone (DILAUDID) injection 1 mg (has no administration in time range)  HYDROmorphone (DILAUDID) injection 1 mg (has no administration in time range)    Or  HYDROmorphone (DILAUDID) injection 1 mg (has no administration in time range)  HYDROmorphone (DILAUDID) injection 1 mg (has no administration in time range)    Or  HYDROmorphone (DILAUDID) injection 1 mg (has no administration in time range)  ondansetron (ZOFRAN) injection 4 mg (has no administration in time range)  diphenhydrAMINE (BENADRYL) capsule 25-50 mg (has no administration in time range)  0.45 % sodium chloride infusion ( Intravenous New Bag/Given 04/06/19 1010)  HYDROmorphone (DILAUDID) injection 0.5 mg (0.5 mg Intravenous Given 04/06/19 1011)    Or  HYDROmorphone (DILAUDID) injection 0.5 mg ( Subcutaneous See Alternative 04/06/19 1011)  ketorolac (TORADOL) 15 MG/ML injection 15 mg (15 mg Intravenous Given 04/06/19 1010)     Initial Impression / Assessment and Plan / ED Course  I have reviewed the triage vital signs and the nursing notes.  Pertinent labs & imaging results that were available during my care of the patient were reviewed by me and considered in my  medical decision making (see chart for details).  Clinical Course as of Apr 06 1107  Mon Apr 06, 2019  1052 Patient stating she feels completely better after 0.5 mg Dilaudid and 15 mg Toradol.  She is declining her chest x-ray and just wants to her COVID-19 test and to go home.   [AL]    Clinical Course User Index [AL] Emi HolesLaw, Lester Crickenberger M, PA-C       Patient presenting with sickle cell pain and also need for COVID-19 test prior to traveling to [place].  Patient's pain is typical of her normal.  She is feeling completely improved after small dose of Dilaudid and Toradol.    Labs are stable for the patient.  Hemoglobin 10.4. She declines her x-ray. No fever, cough, or shortness of breath.  Low suspicion of acute chest, although patient advised that we cannot completely rule this out.  Return precautions discussed.  Follow-up to PCP as needed.  Patient vital stable throughout ED course and discharged in satisfactory condition.  Patient also evaluated by my attending, Dr. Stevie Kernykstra, who guided the patient's management and agrees with plan.  Final Clinical Impressions(s) / ED Diagnoses   Final diagnoses:  Sickle cell crisis [facility](HCC)    ED Discharge Orders    None       Emi HolesLaw, Annis Lagoy M, PA-C 04/06/19 1109    Milagros Lollykstra, Richard S, MD 04/07/19 [phone]

## 2019-04-06 NOTE — ED Triage Notes (Signed)
Pt takes tramadol for pain, last dose this am 0800. Pain started 3 days ago.

## 2019-04-06 NOTE — Discharge Instructions (Addendum)
You will be called only if your COVID-19 test is positive.  Otherwise, you can follow-up with your result on MyChart (directions below).  Please follow-up with your doctor as needed for your sickle cell pain.  You have declined chest x-ray today and thus we could miss certain things such as acute chest syndrome.  Please return to the emergency department immediately if you develop any new or worsening symptoms including severe worsening chest pain, cough, fever, shortness of breath.

## 2019-04-06 NOTE — ED Notes (Signed)
Pt refuses X-ray.

## 2019-04-07 LAB — NOVEL CORONAVIRUS, NAA (HOSP ORDER, SEND-OUT TO REF LAB; TAT 18-24 HRS): SARS-CoV-2, NAA: NOT DETECTED

## 2019-04-13 ENCOUNTER — Emergency Department (HOSPITAL_COMMUNITY): Payer: Self-pay

## 2019-04-13 ENCOUNTER — Encounter (HOSPITAL_COMMUNITY): Payer: Self-pay | Admitting: Emergency Medicine

## 2019-04-13 ENCOUNTER — Emergency Department (HOSPITAL_COMMUNITY)
Admission: EM | Admit: 2019-04-13 | Discharge: 2019-04-13 | Disposition: A | Discharge status: Home / Self Care | Payer: Self-pay | Attending: Emergency Medicine | Admitting: Emergency Medicine

## 2019-04-13 ENCOUNTER — Other Ambulatory Visit: Payer: Self-pay

## 2019-04-13 DIAGNOSIS — Z20828 Contact with and (suspected) exposure to other viral communicable diseases: Secondary | ICD-10-CM | POA: Insufficient documentation

## 2019-04-13 DIAGNOSIS — D57219 Sickle-cell/Hb-C disease with crisis, unspecified: Secondary | ICD-10-CM | POA: Insufficient documentation

## 2019-04-13 DIAGNOSIS — R079 Chest pain, unspecified: Secondary | ICD-10-CM

## 2019-04-13 DIAGNOSIS — Z79899 Other long term (current) drug therapy: Secondary | ICD-10-CM | POA: Insufficient documentation

## 2019-04-13 DIAGNOSIS — I10 Essential (primary) hypertension: Secondary | ICD-10-CM | POA: Insufficient documentation

## 2019-04-13 DIAGNOSIS — D5700 Hb-SS disease with crisis, unspecified: Secondary | ICD-10-CM

## 2019-04-13 LAB — COMPREHENSIVE METABOLIC PANEL
ALT: 20 U/L (ref 0–44)
AST: 20 U/L (ref 15–41)
Albumin: 3.8 g/dL (ref 3.5–5.0)
Alkaline Phosphatase: 83 U/L (ref 38–126)
Anion gap: 8 (ref 5–15)
BUN: 6 mg/dL — ABNORMAL LOW (ref 8–23)
CO2: 25 mmol/L (ref 22–32)
Calcium: 8.7 mg/dL — ABNORMAL LOW (ref 8.9–10.3)
Chloride: 107 mmol/L (ref 98–111)
Creatinine, Ser: 0.73 mg/dL (ref 0.44–1.00)
GFR calc Af Amer: >60 mL/min (ref >60)
GFR calc non Af Amer: >60 mL/min (ref >60)
Glucose, Bld: 114 mg/dL — ABNORMAL HIGH (ref 70–99)
Potassium: 3.7 mmol/L (ref 3.5–5.1)
Sodium: 140 mmol/L (ref 135–145)
Total Bilirubin: 1.3 mg/dL — ABNORMAL HIGH (ref 0.3–1.2)
Total Protein: 7.4 g/dL (ref 6.5–8.1)

## 2019-04-13 LAB — CBC WITH DIFFERENTIAL/PLATELET
Abs Immature Granulocytes: 0.02 10*3/uL (ref 0.00–0.07)
Basophils Absolute: 0 10*3/uL (ref 0.0–0.1)
Basophils Relative: 1 %
Eosinophils Absolute: 0.1 10*3/uL (ref 0.0–0.5)
Eosinophils Relative: 2 %
HCT: 29.4 % — ABNORMAL LOW (ref 36.0–46.0)
Hemoglobin: 10.5 g/dL — ABNORMAL LOW (ref 12.0–15.0)
Immature Granulocytes: 0 %
Lymphocytes Relative: 29 %
Lymphs Abs: 1.4 10*3/uL (ref 0.7–4.0)
MCH: 25.3 pg — ABNORMAL LOW (ref 26.0–34.0)
MCHC: 35.7 g/dL (ref 30.0–36.0)
MCV: 70.8 fL — ABNORMAL LOW (ref 80.0–100.0)
Monocytes Absolute: 0.3 10*3/uL (ref 0.1–1.0)
Monocytes Relative: 6 %
Neutro Abs: 3 10*3/uL (ref 1.7–7.7)
Neutrophils Relative %: 62 %
Platelets: 178 10*3/uL (ref 150–400)
RBC: 4.15 MIL/uL (ref 3.87–5.11)
RDW: 17.5 % — ABNORMAL HIGH (ref 11.5–15.5)
WBC: 4.8 10*3/uL (ref 4.0–10.5)
nRBC: 0 % (ref 0.0–0.2)

## 2019-04-13 LAB — RETICULOCYTES
Immature Retic Fract: 25.5 % — ABNORMAL HIGH (ref 2.3–15.9)
RBC.: 4.15 MIL/uL (ref 3.87–5.11)
Retic Count, Absolute: 82.2 10*3/uL (ref 19.0–186.0)
Retic Ct Pct: 2 % (ref 0.4–3.1)

## 2019-04-13 LAB — TROPONIN I (HIGH SENSITIVITY): Troponin I (High Sensitivity): <2 ng/L (ref <18)

## 2019-04-13 MED ORDER — HYDROMORPHONE HCL 1 MG/ML IJ SOLN
0.5000 mg | INTRAMUSCULAR | Status: AC
  Administered 2019-04-13: 0.5 mg via INTRAVENOUS
  Filled 2019-04-13: qty 1

## 2019-04-13 MED ORDER — HYDROMORPHONE HCL 1 MG/ML IJ SOLN: 0.5000 mg | INTRAMUSCULAR | Status: AC

## 2019-04-13 MED ORDER — IOHEXOL 350 MG/ML SOLN
100.0000 mL | Freq: Once | INTRAVENOUS | Status: AC | PRN
  Administered 2019-04-13: 100 mL via INTRAVENOUS

## 2019-04-13 MED ORDER — KETOROLAC TROMETHAMINE 15 MG/ML IJ SOLN
15.0000 mg | INTRAMUSCULAR | Status: AC
  Administered 2019-04-13: 15 mg via INTRAVENOUS
  Filled 2019-04-13: qty 1

## 2019-04-13 MED ORDER — SODIUM CHLORIDE 0.45 % IV SOLN: INTRAVENOUS | Status: DC

## 2019-04-13 MED ORDER — SODIUM CHLORIDE 0.9% FLUSH
3.0000 mL | Freq: Once | INTRAVENOUS | Status: AC
  Administered 2019-04-13: 11:00:00 3 mL via INTRAVENOUS

## 2019-04-13 MED ORDER — ONDANSETRON HCL 4 MG/2ML IJ SOLN: 4.0000 mg | INTRAMUSCULAR | Status: DC | PRN

## 2019-04-13 MED ORDER — DIPHENHYDRAMINE HCL 25 MG PO CAPS: 25.0000 mg | ORAL_CAPSULE | ORAL | Status: DC | PRN

## 2019-04-13 NOTE — ED Notes (Signed)
Patient verbalizes understanding of discharge instructions. Opportunity for questioning and answers were provided. Armband removed by staff, pt discharged from ED.  

## 2019-04-13 NOTE — ED Notes (Signed)
Pt refusing maintenance IV fluids.

## 2019-04-13 NOTE — Discharge Instructions (Signed)
Your work-up today was reassuring, continue taking your home medications regularly.  You have a COVID swab pending, you can check your results online, this should be back in about 2 days.  Return for any new or worsening symptoms.

## 2019-04-13 NOTE — ED Triage Notes (Signed)
Pt reports chest and back pain. Hx of sickle cell. States always feels bad but today woke up feeling worse. Takes tramadol for pain with no relief. Alert, oriented x4, NAD at present.

## 2019-04-13 NOTE — ED Notes (Signed)
Patient transported to CT 

## 2019-04-13 NOTE — ED Provider Notes (Signed)
[facility] EMERGENCY DEPARTMENT Provider Note   CSN: [id number] Arrival date & time: 04/13/19  0847     History   Chief Complaint Chief Complaint  Patient presents with   Sickle Cell Pain Crisis    HPI Kayla Woodard is a 63 y.o. female.     Kayla Woodard is a 63 y.o. female with a history of sickle cell anemia, hypertension, back pain, who presents to the ED for evaluation of chest and back pain over the past 2 to 3 days.  She reports this pain feels similar to previous sickle cell crisis.  But she reports that over the past 3 weeks she has been having frequent chest and back pain and cannot seem to get herself well on home tramadol.  Patient reports pain is pleuritic in nature.  It is not exertional and is nonradiating.  Not associated with shortness of breath, lightheadedness, syncope, diaphoresis or vomiting.  No abdominal pain or pain in the extremities.  No numbness or weakness.  No headache or vision changes.  Patient reports that she is trying to get well enough to fly back [facility]     Past Medical History:  Diagnosis Date   Anemia    Back pain, chronic    Hypertension    Sickle cell anemia [facility])     Patient Active Problem List   Diagnosis Date Noted   Elevated liver enzymes 09/24/2016   Vertigo 08/27/2016   Chronic pain 01/11/2016   COUGH 11/15/2009   OTALGIA 06/27/2009   BACK PAIN, THORACIC REGION 06/27/2009   HYPOKALEMIA 03/29/2009   NECK SPRAIN AND STRAIN 03/29/2009   Sickle cell anemia (HCC) 03/21/2009   DEPRESSION 03/21/2009   ESSENTIAL HYPERTENSION, BENIGN 03/21/2009   ALLERGIC RHINITIS 03/21/2009   GERD 03/21/2009   LUMBAGO 03/21/2009   CATARACT EXTRACTION, HX OF 03/21/2009    Past Surgical History:  Procedure Laterality Date   CESAREAN SECTION       OB History   No obstetric history on file.      Home Medications    Prior to Admission medications   Medication Sig Start Date  End Date Taking? Authorizing Provider  cetirizine (ZYRTEC) 10 MG tablet Take 1 tablet (10 mg total) by mouth daily. 08/13/18   Anders Simmonds, PA-C  ergocalciferol (DRISDOL) 50000 units capsule Take 1 capsule (50,000 Units total) by mouth once a week. Patient not taking: Reported on 08/04/2018 02/06/17   Hoy Register, MD  folic acid (FOLVITE) 1 MG tablet TAKE 2 TABLETS BY MOUTH DAILY Patient taking differently: Take 2 mg by mouth daily.  06/20/18   Hoy Register, MD  ibuprofen (ADVIL,MOTRIN) 600 MG tablet Take 1 tablet (600 mg total) by mouth every 6 (six) hours as needed. Patient taking differently: Take 600 mg by mouth every 6 (six) hours as needed for moderate pain.  07/16/18   Hoy Register, MD  losartan (COZAAR) 100 MG tablet TAKE 1 TABLET BY MOUTH DAILY. 02/23/19   Hoy Register, MD  methocarbamol (ROBAXIN) 500 MG tablet 1-2 every 8 hours as needed for muscle spasm 08/13/18   Georgian Co M, PA-C  naproxen (NAPROSYN) 500 MG tablet Take 1 tablet (500 mg total) by mouth 2 (two) times daily with a meal. 08/13/18   McClung, Marzella Schlein, PA-C  omeprazole (PRILOSEC) 20 MG capsule TAKE 2 CAPSULES BY MOUTH DAILY. 11/17/18   Hoy Register, MD  traMADol (ULTRAM) 50 MG tablet TAKE 1 TABLET BY MOUTH 3 (THREE) TIMES DAILY. 12/18/18  Hoy RegisterNewlin, Enobong, MD    Family History Family History  Problem Relation Age of Onset   Hypertension Mother    Sickle cell anemia Father     Social History Social History   Tobacco Use   Smoking status: Never Smoker   Smokeless tobacco: Never Used  Substance Use Topics   Alcohol use: No   Drug use: No     Allergies   Oxycodone and Vicodin [hydrocodone-acetaminophen]   Review of Systems Review of Systems  Constitutional: Negative for chills and fever.  HENT: Negative.   Eyes: Negative for visual disturbance.  Respiratory: Negative for cough and shortness of breath.   Cardiovascular: Positive for chest pain. Negative for palpitations and leg  swelling.  Gastrointestinal: Negative for abdominal pain, diarrhea, nausea and vomiting.  Genitourinary: Negative for dysuria.  Musculoskeletal: Positive for back pain. Negative for arthralgias, joint swelling and myalgias.  Skin: Negative for color change and rash.  Neurological: Negative for dizziness, syncope, light-headedness and headaches.     Physical Exam Updated Vital Signs BP (!) 146/90    Pulse 66    Temp 98.6 F (37 C)    Resp 14    Ht 5\' 4"  (1.626 m)    Wt 77.1 kg    SpO2 98%    BMI 29.18 kg/m   Physical Exam Vitals signs and nursing note reviewed.  Constitutional:      General: She is not in acute distress.    Appearance: Normal appearance. She is well-developed and normal weight. She is not ill-appearing or diaphoretic.     Comments: Well-appearing and in no distress.  HENT:     Head: Normocephalic and atraumatic.     Mouth/Throat:     Mouth: Mucous membranes are moist.     Pharynx: Oropharynx is clear.  Eyes:     General:        Right eye: No discharge.        Left eye: No discharge.     Pupils: Pupils are equal, round, and reactive to light.  Neck:     Musculoskeletal: Neck supple.  Cardiovascular:     Rate and Rhythm: Normal rate and regular rhythm.     Heart sounds: Normal heart sounds. No murmur. No friction rub. No gallop.   Pulmonary:     Effort: Pulmonary effort is normal. No respiratory distress.     Breath sounds: Normal breath sounds. No wheezing or rales.     Comments: Respirations equal and unlabored, patient able to speak in full sentences, lungs clear to auscultation bilaterally, pain not reproducible with palpation Chest:     Chest wall: No tenderness.  Abdominal:     General: Bowel sounds are normal. There is no distension.     Palpations: Abdomen is soft. There is no mass.     Tenderness: There is no abdominal tenderness. There is no guarding.     Comments: Abdomen soft, nondistended, nontender to palpation in all quadrants without  guarding or peritoneal signs  Musculoskeletal:        General: No deformity.     Right lower leg: No edema.     Left lower leg: No edema.  Skin:    General: Skin is warm and dry.     Capillary Refill: Capillary refill takes less than 2 seconds.  Neurological:     Mental Status: She is alert.     Coordination: Coordination normal.     Comments: Speech is clear, able to follow commands Moves extremities without  ataxia, coordination intact  Psychiatric:        Mood and Affect: Mood normal.        Behavior: Behavior normal.      ED Treatments / Results  Labs (all labs ordered are listed, but only abnormal results are displayed) Labs Reviewed  COMPREHENSIVE METABOLIC PANEL - Abnormal; Notable for the following components:      Result Value   Glucose, Bld 114 (*)    BUN 6 (*)    Calcium 8.7 (*)    Total Bilirubin 1.3 (*)    All other components within normal limits  CBC WITH DIFFERENTIAL/PLATELET - Abnormal; Notable for the following components:   Hemoglobin 10.5 (*)    HCT 29.4 (*)    MCV 70.8 (*)    MCH 25.3 (*)    RDW 17.5 (*)    All other components within normal limits  RETICULOCYTES - Abnormal; Notable for the following components:   Immature Retic Fract 25.5 (*)    All other components within normal limits  NOVEL CORONAVIRUS, NAA (HOSP ORDER, SEND-OUT TO REF LAB; TAT 18-24 HRS)  TROPONIN I (HIGH SENSITIVITY)  TROPONIN I (HIGH SENSITIVITY)    EKG EKG Interpretation  Date/Time:  Monday April 13 2019 09:01:00 EDT Ventricular Rate:  85 PR Interval:    QRS Duration: 82 QT Interval:  363 QTC Calculation: 432 R Axis:   34 Text Interpretation:  Sinus rhythm Abnormal R-wave progression, early transition No significant change was found Confirmed by Paula Libra ([id number]) on 04/14/2019 12:21:39 PM   Radiology Dg Chest 2 View  Result Date: 04/13/2019 CLINICAL DATA:  Chest pain.  Sickle cell disease EXAM: CHEST - 2 VIEW COMPARISON:  08/04/2018 FINDINGS: Mild  cardiomegaly. Stable aortic contours. There is no edema, consolidation, effusion, or pneumothorax. Degenerative endplate spurring. Artifact from EKG leads. IMPRESSION: No evidence of active disease. Electronically Signed   By: Marnee Spring M.D.   On: 04/13/2019 09:52   Ct Angio Chest Pe W And/or Wo Contrast  Result Date: 04/13/2019 CLINICAL DATA:  History of sickle cell.  Chest and back pain. EXAM: CT ANGIOGRAPHY CHEST WITH CONTRAST TECHNIQUE: Multidetector CT imaging of the chest was performed using the standard protocol during bolus administration of intravenous contrast. Multiplanar CT image reconstructions and MIPs were obtained to evaluate the vascular anatomy. CONTRAST:  OMNIPAQUE IOHEXOL 350 MG/ML SOLN COMPARISON:  Chest x-ray earlier today.  Chest CT 09/22/2016. FINDINGS: Cardiovascular: No filling defects in the pulmonary arteries to suggestpulmonary emboli. Heart is borderline in size. Aorta is normal caliber. Mediastinum/Nodes: No mediastinal, hilar, or axillary adenopathy. Lungs/Pleura: No confluent opacities or effusions. Upper Abdomen: Imaging into the upper abdomen shows no acute findings. Musculoskeletal: Chest wall soft tissues are unremarkable. No acute bony abnormality. Review of the MIP images confirms the above findings. IMPRESSION: No evidence of pulmonary embolus. Borderline heart size. No acute cardiopulmonary disease. Electronically Signed   By: Charlett Nose M.D.   On: 04/13/2019 11:24    Procedures Procedures (including critical care time)  Medications Ordered in ED Medications  0.45 % sodium chloride infusion ( Intravenous Refused 04/13/19 0929)  HYDROmorphone (DILAUDID) injection 0.5 mg (has no administration in time range)    Or  HYDROmorphone (DILAUDID) injection 0.5 mg (has no administration in time range)  diphenhydrAMINE (BENADRYL) capsule 25-50 mg (has no administration in time range)  ondansetron (ZOFRAN) injection 4 mg (has no administration in time  range)  sodium chloride flush (NS) 0.9 % injection 3 mL (3 mLs Intravenous Given  04/13/19 1126)  ketorolac (TORADOL) 15 MG/ML injection 15 mg (15 mg Intravenous Given 04/13/19 6237)  HYDROmorphone (DILAUDID) injection 0.5 mg (0.5 mg Intravenous Given 04/13/19 6283)    Or  HYDROmorphone (DILAUDID) injection 0.5 mg ( Subcutaneous See Alternative 04/13/19 0922)  HYDROmorphone (DILAUDID) injection 0.5 mg (0.5 mg Intravenous Given 04/13/19 1036)    Or  HYDROmorphone (DILAUDID) injection 0.5 mg ( Subcutaneous See Alternative 04/13/19 1036)  iohexol (OMNIPAQUE) 350 MG/ML injection 100 mL (100 mLs Intravenous Contrast Given 04/13/19 1106)     Initial Impression / Assessment and Plan / ED Course  I have reviewed the triage vital signs and the nursing notes.  Pertinent labs & imaging results that were available during my care of the patient were reviewed by me and considered in my medical decision making (see chart for details).  62 year old female with history of sickle cell anemia presenting to the ED for chest and back pain over the past few days not improving with home tramadol.  Reports this pain is not atypical for her sickle cell crisis although does report this is worse than usual.  She has been trying to get well so that she can travel back to [place] to see her mom who is following ill, but reports that she keeps having flareups of her pain that keep her from traveling.  Was last seen in the ED on 10/5.  She reports that she is concerned that she just cannot seem to get herself well with her home medications.  Chest pain is somewhat pleuritic but is not exertional, radiates to the back but no other radiation.  No associated emesis or diaphoresis.  No associated fever or cough to suggest acute chest syndrome.  Patient is not short of breath.  On arrival vitals are normal.  Will check sickle cell labs including CBC, CMP and reticulocyte count, will also check troponin and CT Angio of the chest given  persistent chest and back pain that is pleuritic in nature.  IV fluids and pain medication given for treatment of potential sickle cell pain crisis while awaiting work-up.  No leukocytosis, hemoglobin at baseline, no significant electrolyte derangements, normal renal and liver function.  EKG without concerning changes and troponin is negative.  CT Angio of the chest without evidence of PE.  Patient reports that after pain medication she is feeling much better and feels like she can continue to manage her symptoms at home.  Patient does request a COVID test given her potential travel in the next few days.  Outpatient testing was ordered.  At this time patient is stable for discharge home.  Return precautions discussed.  Final Clinical Impressions(s) / ED Diagnoses   Final diagnoses:  Sickle cell pain crisis [facility])  Chest pain, unspecified type    ED Discharge Orders    None       Jacqlyn Larsen, [place] 04/14/19 2354    Charlesetta Shanks, MD 04/22/19 [phone]

## 2019-04-14 LAB — NOVEL CORONAVIRUS, NAA (HOSP ORDER, SEND-OUT TO REF LAB; TAT 18-24 HRS): SARS-CoV-2, NAA: NOT DETECTED

## 2019-04-20 ENCOUNTER — Other Ambulatory Visit: Payer: Self-pay

## 2019-04-20 ENCOUNTER — Emergency Department (HOSPITAL_COMMUNITY)
Admission: EM | Admit: 2019-04-20 | Discharge: 2019-04-20 | Disposition: A | Discharge status: Home / Self Care | Payer: Self-pay | Attending: Emergency Medicine | Admitting: Emergency Medicine

## 2019-04-20 DIAGNOSIS — I10 Essential (primary) hypertension: Secondary | ICD-10-CM | POA: Insufficient documentation

## 2019-04-20 DIAGNOSIS — Z79899 Other long term (current) drug therapy: Secondary | ICD-10-CM | POA: Insufficient documentation

## 2019-04-20 DIAGNOSIS — R42 Dizziness and giddiness: Secondary | ICD-10-CM | POA: Insufficient documentation

## 2019-04-20 DIAGNOSIS — Z20828 Contact with and (suspected) exposure to other viral communicable diseases: Secondary | ICD-10-CM | POA: Insufficient documentation

## 2019-04-20 LAB — SARS CORONAVIRUS 2 (TAT 6-24 HRS): SARS Coronavirus 2: NEGATIVE

## 2019-04-20 NOTE — ED Triage Notes (Addendum)
Pt presents to ED for dizziness that became worse yesterday. Pt reports that she is chronically dizzy d/t sickle cell anemia and HTN. Denies vision changes, SOB, CP, bloody stool, calf cramping.  States she does not have PCP, recently seen in ED for similar. "I want to be tested for covid19"   Takes tramadol 50mg  TID.

## 2019-04-20 NOTE — ED Triage Notes (Signed)
PT grabbed  Testing  Kit for COVID -19 from this writer and proceeded to swab her own nose. Pt reported she always does her own test. This Probation officer instructed PT That [place] does not have a procedure for self ADm . Of COVID test. Pt ignored this info and swabbed her nose.

## 2019-04-20 NOTE — ED Notes (Signed)
Patient verbalizes understanding of discharge instructions . Opportunity for questions and answers were provided . Armband removed by staff ,Pt discharged from ED. W/C  offered at D/C  and Declined W/C at D/C and was escorted to lobby by RN.  

## 2019-04-20 NOTE — ED Provider Notes (Signed)
[facility] EMERGENCY DEPARTMENT Provider Note   CSN: [id number] Arrival date & time: 04/20/19  4132     History   Chief Complaint Chief Complaint  Patient presents with  . Dizziness    HPI Kayla Woodard is a 62 y.o. female.     HPI Patient presents with concern of dizziness and possible coronavirus infection. Patient has multiple medical issues, including sickle cell disease, and chronic dizziness. This episode of dizziness has been going on for 1 day, is generally unremarkable compared to other episodes. Patient notes that she has typical sickle cell pain, more in the right leg, than the rest of her body which is also sore. Pain is controlled with tramadol, as it typically is. She notes that today she has particular concern of possible coronavirus infection, given her recurrence of dizziness beginning yesterday. No new syncope, fall, vision changes, nausea, vomiting, diarrhea.   Past Medical History:  Diagnosis Date  . Anemia   . Back pain, chronic   . Hypertension   . Sickle cell anemia [facility])     Patient Active Problem List   Diagnosis Date Noted  . Elevated liver enzymes 09/24/2016  . Vertigo 08/27/2016  . Chronic pain 01/11/2016  . COUGH 11/15/2009  . OTALGIA 06/27/2009  . BACK PAIN, THORACIC REGION 06/27/2009  . HYPOKALEMIA 03/29/2009  . NECK SPRAIN AND STRAIN 03/29/2009  . Sickle cell anemia (HCC) 03/21/2009  . DEPRESSION 03/21/2009  . ESSENTIAL HYPERTENSION, BENIGN 03/21/2009  . ALLERGIC RHINITIS 03/21/2009  . GERD 03/21/2009  . LUMBAGO 03/21/2009  . CATARACT EXTRACTION, HX OF 03/21/2009    Past Surgical History:  Procedure Laterality Date  . CESAREAN SECTION       OB History   No obstetric history on file.      Home Medications    Prior to Admission medications   Medication Sig Start Date End Date Taking? Authorizing Provider  cetirizine (ZYRTEC) 10 MG tablet Take 1 tablet (10 mg total) by mouth daily. 08/13/18    Anders Simmonds, PA-C  ergocalciferol (DRISDOL) 50000 units capsule Take 1 capsule (50,000 Units total) by mouth once a week. Patient not taking: Reported on 08/04/2018 02/06/17   Hoy Register, MD  folic acid (FOLVITE) 1 MG tablet TAKE 2 TABLETS BY MOUTH DAILY Patient taking differently: Take 2 mg by mouth daily.  06/20/18   Hoy Register, MD  ibuprofen (ADVIL,MOTRIN) 600 MG tablet Take 1 tablet (600 mg total) by mouth every 6 (six) hours as needed. Patient taking differently: Take 600 mg by mouth every 6 (six) hours as needed for moderate pain.  07/16/18   Hoy Register, MD  losartan (COZAAR) 100 MG tablet TAKE 1 TABLET BY MOUTH DAILY. 02/23/19   Hoy Register, MD  methocarbamol (ROBAXIN) 500 MG tablet 1-2 every 8 hours as needed for muscle spasm 08/13/18   Georgian Co M, PA-C  naproxen (NAPROSYN) 500 MG tablet Take 1 tablet (500 mg total) by mouth 2 (two) times daily with a meal. 08/13/18   McClung, Marzella Schlein, PA-C  omeprazole (PRILOSEC) 20 MG capsule TAKE 2 CAPSULES BY MOUTH DAILY. 11/17/18   Hoy Register, MD  traMADol (ULTRAM) 50 MG tablet TAKE 1 TABLET BY MOUTH 3 (THREE) TIMES DAILY. 12/18/18   Hoy Register, MD    Family History Family History  Problem Relation Age of Onset  . Hypertension Mother   . Sickle cell anemia Father     Social History Social History   Tobacco Use  . Smoking status: Never  Smoker  . Smokeless tobacco: Never Used  Substance Use Topics  . Alcohol use: No  . Drug use: No     Allergies   Oxycodone and Vicodin [hydrocodone-acetaminophen]   Review of Systems Review of Systems  Constitutional:       Per HPI, otherwise negative  HENT:       Per HPI, otherwise negative  Respiratory:       Per HPI, otherwise negative  Cardiovascular:       Per HPI, otherwise negative  Gastrointestinal: Negative for vomiting.  Endocrine:       Negative aside from HPI  Genitourinary:       Neg aside from HPI   Musculoskeletal:       Per HPI, otherwise  negative  Skin: Negative.   Allergic/Immunologic: Positive for immunocompromised state.  Neurological: Positive for dizziness. Negative for syncope.  Hematological:       Per HPI     Physical Exam Updated Vital Signs BP 140/85   Pulse 86   Resp (!) 26   Ht 5\' 4"  (1.626 m)   Wt 77.1 kg   SpO2 98%   BMI 29.18 kg/m   Physical Exam Vitals signs and nursing note reviewed.  Constitutional:      General: She is not in acute distress.    Appearance: She is well-developed.  HENT:     Head: Normocephalic and atraumatic.  Eyes:     Conjunctiva/sclera: Conjunctivae normal.  Cardiovascular:     Rate and Rhythm: Normal rate and regular rhythm.  Pulmonary:     Effort: Pulmonary effort is normal. No respiratory distress.     Breath sounds: Normal breath sounds. No stridor.  Abdominal:     General: There is no distension.  Skin:    General: Skin is warm and dry.  Neurological:     General: No focal deficit present.     Mental Status: She is alert and oriented to person, place, and time.     Cranial Nerves: No cranial nerve deficit.     Motor: No weakness, tremor, atrophy or abnormal muscle tone.     Coordination: Coordination normal.      ED Treatments / Results  Labs (all labs ordered are listed, but only abnormal results are displayed) Labs Reviewed  SARS CORONAVIRUS 2 (TAT 6-24 HRS)    EKG EKG Interpretation  Date/Time:  Monday April 20 2019 10:05:11 EDT Ventricular Rate:  85 PR Interval:    QRS Duration: 94 QT Interval:  373 QTC Calculation: 444 R Axis:   42 Text Interpretation:  Sinus rhythm Low voltage, precordial leads Baseline wander Otherwise within normal limits Confirmed by Gerhard MunchLockwood, Leetta Hendriks [id number]([id number]) on 04/20/2019 10:59:06 AM    Procedures Procedures (including critical care time)  Medications Ordered in ED Medications - No data to display   Initial Impression / Assessment and Plan / ED Course  I have reviewed the triage vital signs and the  nursing notes.  Pertinent labs & imaging results that were available during my care of the patient were reviewed by me and considered in my medical decision making (see chart for details).    Generally well-appearing female with history of sickle cell disease as well as multiple other medical problems presents with dizziness, possible coronavirus infection Patient is awake, alert, no hypoxia, tachypnea, cough, suggesting URI. Given the patient's relative immunocompromised state, concern for the virus, test was sent. With reassuring EKG, physical exam, no indication for advanced imaging of her head, low suspicion for  stroke, or other central cause of her dizziness.  The patient also has a documented history of dizziness, which is being evaluated her primary care team.  [name] was evaluated in Emergency Department on 04/20/2019 for the symptoms described in the history of present illness. She was evaluated in the context of the global COVID-19 pandemic, which necessitated consideration that the patient might be at risk for infection with the SARS-CoV-2 virus that causes COVID-19. Institutional protocols and algorithms that pertain to the evaluation of patients at risk for COVID-19 are in a state of rapid change based on information released by regulatory bodies including the CDC and federal and state organizations. These policies and algorithms were followed during the patient's care in the ED.  Final Clinical Impressions(s) / ED Diagnoses   Final diagnoses:  Dizziness     Carmin Muskrat, MD 04/20/19 1113

## 2019-04-20 NOTE — Discharge Instructions (Addendum)
As discussed, today's evaluation has been generally reassuring. A Covid test has been sent, and results will be available in the next day or 2. Please be sure to follow-up with your physician, or return here if you develop new, or concerning changes in your condition.

## 2019-05-04 ENCOUNTER — Other Ambulatory Visit: Payer: Self-pay | Admitting: Family Medicine

## 2019-05-04 DIAGNOSIS — D571 Sickle-cell disease without crisis: Secondary | ICD-10-CM

## 2019-05-04 DIAGNOSIS — I10 Essential (primary) hypertension: Secondary | ICD-10-CM

## 2019-05-04 NOTE — Telephone Encounter (Signed)
Please fill pain medication if appropriate and BP medication. Last seen in office 08/13/2018. Has been in the ED 3 times since.

## 2019-05-06 MED FILL — LOSARTAN POTASSIUM 100 MG T: 100 | 30 days supply | Qty: 30 | Fill #0

## 2019-05-06 MED FILL — traMADol HCL 50 MG TABS: 50 | 30 days supply | Qty: 90 | Fill #0

## 2019-05-12 ENCOUNTER — Encounter (HOSPITAL_COMMUNITY): Payer: Self-pay

## 2019-05-12 ENCOUNTER — Other Ambulatory Visit: Payer: Self-pay

## 2019-05-12 ENCOUNTER — Emergency Department (HOSPITAL_COMMUNITY)
Admission: EM | Admit: 2019-05-12 | Discharge: 2019-05-12 | Disposition: A | Discharge status: Home / Self Care | Payer: Self-pay | Attending: Emergency Medicine | Admitting: Emergency Medicine

## 2019-05-12 DIAGNOSIS — D5700 Hb-SS disease with crisis, unspecified: Secondary | ICD-10-CM | POA: Insufficient documentation

## 2019-05-12 DIAGNOSIS — I10 Essential (primary) hypertension: Secondary | ICD-10-CM | POA: Insufficient documentation

## 2019-05-12 DIAGNOSIS — Z79899 Other long term (current) drug therapy: Secondary | ICD-10-CM | POA: Insufficient documentation

## 2019-05-12 DIAGNOSIS — Z20828 Contact with and (suspected) exposure to other viral communicable diseases: Secondary | ICD-10-CM | POA: Insufficient documentation

## 2019-05-12 LAB — CBC WITH DIFFERENTIAL/PLATELET
Abs Immature Granulocytes: 0.01 10*3/uL (ref 0.00–0.07)
Basophils Absolute: 0 10*3/uL (ref 0.0–0.1)
Basophils Relative: 0 %
Eosinophils Absolute: 0.1 10*3/uL (ref 0.0–0.5)
Eosinophils Relative: 2 %
HCT: 30.9 % — ABNORMAL LOW (ref 36.0–46.0)
Hemoglobin: 11 g/dL — ABNORMAL LOW (ref 12.0–15.0)
Immature Granulocytes: 0 %
Lymphocytes Relative: 30 %
Lymphs Abs: 1.4 10*3/uL (ref 0.7–4.0)
MCH: 25.5 pg — ABNORMAL LOW (ref 26.0–34.0)
MCHC: 35.6 g/dL (ref 30.0–36.0)
MCV: 71.5 fL — ABNORMAL LOW (ref 80.0–100.0)
Monocytes Absolute: 0.2 10*3/uL (ref 0.1–1.0)
Monocytes Relative: 4 %
Neutro Abs: 2.9 10*3/uL (ref 1.7–7.7)
Neutrophils Relative %: 64 %
Platelets: 164 10*3/uL (ref 150–400)
RBC: 4.32 MIL/uL (ref 3.87–5.11)
RDW: 18.2 % — ABNORMAL HIGH (ref 11.5–15.5)
WBC: 4.6 10*3/uL (ref 4.0–10.5)
nRBC: 0 % (ref 0.0–0.2)

## 2019-05-12 LAB — RETICULOCYTES
Immature Retic Fract: 24.8 % — ABNORMAL HIGH (ref 2.3–15.9)
RBC.: 4.32 MIL/uL (ref 3.87–5.11)
Retic Count, Absolute: 100.7 10*3/uL (ref 19.0–186.0)
Retic Ct Pct: 2.3 % (ref 0.4–3.1)

## 2019-05-12 LAB — SARS CORONAVIRUS 2 (TAT 6-24 HRS): SARS Coronavirus 2: NEGATIVE

## 2019-05-12 LAB — COMPREHENSIVE METABOLIC PANEL
ALT: 30 U/L (ref 0–44)
AST: 22 U/L (ref 15–41)
Albumin: 3.9 g/dL (ref 3.5–5.0)
Alkaline Phosphatase: 82 U/L (ref 38–126)
Anion gap: 9 (ref 5–15)
BUN: 7 mg/dL — ABNORMAL LOW (ref 8–23)
CO2: 26 mmol/L (ref 22–32)
Calcium: 9.1 mg/dL (ref 8.9–10.3)
Chloride: 106 mmol/L (ref 98–111)
Creatinine, Ser: 0.82 mg/dL (ref 0.44–1.00)
GFR calc Af Amer: >60 mL/min (ref >60)
GFR calc non Af Amer: >60 mL/min (ref >60)
Glucose, Bld: 190 mg/dL — ABNORMAL HIGH (ref 70–99)
Potassium: 3.5 mmol/L (ref 3.5–5.1)
Sodium: 141 mmol/L (ref 135–145)
Total Bilirubin: 1.5 mg/dL — ABNORMAL HIGH (ref 0.3–1.2)
Total Protein: 7.5 g/dL (ref 6.5–8.1)

## 2019-05-12 MED ORDER — HYDROMORPHONE HCL 1 MG/ML IJ SOLN
1.0000 mg | Freq: Once | INTRAMUSCULAR | Status: AC
  Administered 2019-05-12: 16:00:00 1 mg via INTRAVENOUS
  Filled 2019-05-12: qty 1

## 2019-05-12 MED ORDER — ONDANSETRON HCL 4 MG/2ML IJ SOLN
4.0000 mg | Freq: Once | INTRAMUSCULAR | Status: AC
  Administered 2019-05-12: 4 mg via INTRAVENOUS
  Filled 2019-05-12: qty 2

## 2019-05-12 MED ORDER — SODIUM CHLORIDE 0.9% FLUSH
3.0000 mL | Freq: Once | INTRAVENOUS | Status: AC
  Administered 2019-05-12: 16:00:00 3 mL via INTRAVENOUS

## 2019-05-12 NOTE — Discharge Instructions (Addendum)
Follow up with your md as needed °

## 2019-05-12 NOTE — ED Triage Notes (Signed)
Pt has stopped this RN several times wanting me to just swab her for covid so she can leave. If her test is negative she can go back to [place].

## 2019-05-12 NOTE — ED Triage Notes (Signed)
Pt presents w/all over body aches x1 day, states the symptoms are the same as to when she has a sickle cell crisis. Pt also requesting a covid test, states "If I feel better I want to travel, I want to go to [place]."

## 2019-05-17 NOTE — ED Provider Notes (Signed)
[facility] EMERGENCY DEPARTMENT Provider Note   CSN: [id number] Arrival date & time: 05/12/19  3716     History   Chief Complaint Chief Complaint  Patient presents with  . Sickle Cell Pain Crisis    HPI Kayla Woodard is a 63 y.o. female.     Patient has a history of sickle cell disease.  Patient complains of just aching all over.  Patient states that usually 1 shot of pain medicine helps her  The history is provided by the patient. No language interpreter was used.  Sickle Cell Pain Crisis Location:  Unable to specify Severity:  Moderate Onset quality:  Sudden Similar to previous crisis episodes: yes   Timing:  Intermittent Progression:  Waxing and waning Chronicity:  New History of pulmonary emboli: no   Context: not alcohol consumption   Associated symptoms: no chest pain, no congestion, no cough, no fatigue and no headaches     Past Medical History:  Diagnosis Date  . Anemia   . Back pain, chronic   . Hypertension   . Sickle cell anemia [facility])     Patient Active Problem List   Diagnosis Date Noted  . Elevated liver enzymes 09/24/2016  . Vertigo 08/27/2016  . Chronic pain 01/11/2016  . COUGH 11/15/2009  . OTALGIA 06/27/2009  . BACK PAIN, THORACIC REGION 06/27/2009  . HYPOKALEMIA 03/29/2009  . NECK SPRAIN AND STRAIN 03/29/2009  . Sickle cell anemia ([place]) 03/21/2009  . DEPRESSION 03/21/2009  . ESSENTIAL HYPERTENSION, BENIGN 03/21/2009  . ALLERGIC RHINITIS 03/21/2009  . GERD 03/21/2009  . LUMBAGO 03/21/2009  . CATARACT EXTRACTION, HX OF 03/21/2009    Past Surgical History:  Procedure Laterality Date  . CESAREAN SECTION       OB History   No obstetric history on file.      Home Medications    Prior to Admission medications   Medication Sig Start Date End Date Taking? Authorizing Provider  cetirizine (ZYRTEC) 10 MG tablet Take 1 tablet (10 mg total) by mouth daily. 08/13/18   Argentina Donovan, PA-C  ergocalciferol  (DRISDOL) 50000 units capsule Take 1 capsule (50,000 Units total) by mouth once a week. Patient not taking: Reported on 08/04/2018 02/06/17   Charlott Rakes, MD  folic acid (FOLVITE) 1 MG tablet TAKE 2 TABLETS BY MOUTH DAILY Patient taking differently: Take 2 mg by mouth daily.  06/20/18   Charlott Rakes, MD  ibuprofen (ADVIL,MOTRIN) 600 MG tablet Take 1 tablet (600 mg total) by mouth every 6 (six) hours as needed. Patient taking differently: Take 600 mg by mouth every 6 (six) hours as needed for moderate pain.  07/16/18   Charlott Rakes, MD  losartan (COZAAR) 100 MG tablet TAKE 1 TABLET BY MOUTH DAILY. 05/06/19   Charlott Rakes, MD  methocarbamol (ROBAXIN) 500 MG tablet 1-2 every 8 hours as needed for muscle spasm 08/13/18   Freeman Caldron M, PA-C  naproxen (NAPROSYN) 500 MG tablet Take 1 tablet (500 mg total) by mouth 2 (two) times daily with a meal. 08/13/18   McClung, Dionne Bucy, PA-C  omeprazole (PRILOSEC) 20 MG capsule TAKE 2 CAPSULES BY MOUTH DAILY. 11/17/18   Charlott Rakes, MD  traMADol (ULTRAM) 50 MG tablet TAKE 1 TABLET BY MOUTH 3 (THREE) TIMES DAILY. 05/06/19   Charlott Rakes, MD    Family History Family History  Problem Relation Age of Onset  . Hypertension Mother   . Sickle cell anemia Father     Social History Social History  Tobacco Use  . Smoking status: Never Smoker  . Smokeless tobacco: Never Used  Substance Use Topics  . Alcohol use: No  . Drug use: No     Allergies   Oxycodone and Vicodin [hydrocodone-acetaminophen]   Review of Systems Review of Systems  Constitutional: Negative for appetite change and fatigue.  HENT: Negative for congestion, ear discharge and sinus pressure.   Eyes: Negative for discharge.  Respiratory: Negative for cough.   Cardiovascular: Negative for chest pain.  Gastrointestinal: Negative for abdominal pain and diarrhea.  Genitourinary: Negative for frequency and hematuria.  Musculoskeletal: Negative for back pain.  Skin: Negative  for rash.  Neurological: Negative for seizures and headaches.  Psychiatric/Behavioral: Negative for hallucinations.     Physical Exam Updated Vital Signs BP (!) 164/87 (BP Location: Left Arm)   Pulse 92   Temp 98.2 F (36.8 C) (Oral)   Resp (!) 24   Ht 5\' 4"  (1.626 m)   Wt 77.1 kg   SpO2 100%   BMI 29.18 kg/m   Physical Exam Vitals signs and nursing note reviewed.  Constitutional:      Appearance: She is well-developed.  HENT:     Head: Normocephalic.     Nose: Nose normal.  Eyes:     General: No scleral icterus.    Conjunctiva/sclera: Conjunctivae normal.  Neck:     Musculoskeletal: Neck supple.     Thyroid: No thyromegaly.  Cardiovascular:     Rate and Rhythm: Normal rate and regular rhythm.     Heart sounds: No murmur. No friction rub. No gallop.   Pulmonary:     Breath sounds: No stridor. No wheezing or rales.  Chest:     Chest wall: No tenderness.  Abdominal:     General: There is no distension.     Tenderness: There is no abdominal tenderness. There is no rebound.  Musculoskeletal: Normal range of motion.  Lymphadenopathy:     Cervical: No cervical adenopathy.  Skin:    Findings: No erythema or rash.  Neurological:     Mental Status: She is oriented to person, place, and time.     Motor: No abnormal muscle tone.     Coordination: Coordination normal.  Psychiatric:        Behavior: Behavior normal.      ED Treatments / Results  Labs (all labs ordered are listed, but only abnormal results are displayed) Labs Reviewed  COMPREHENSIVE METABOLIC PANEL - Abnormal; Notable for the following components:      Result Value   Glucose, Bld 190 (*)    BUN 7 (*)    Total Bilirubin 1.5 (*)    All other components within normal limits  CBC WITH DIFFERENTIAL/PLATELET - Abnormal; Notable for the following components:   Hemoglobin 11.0 (*)    HCT 30.9 (*)    MCV 71.5 (*)    MCH 25.5 (*)    RDW 18.2 (*)    All other components within normal limits   RETICULOCYTES - Abnormal; Notable for the following components:   Immature Retic Fract 24.8 (*)    All other components within normal limits  SARS CORONAVIRUS 2 (TAT 6-24 HRS)    EKG None  Radiology No results found.  Procedures Procedures (including critical care time)  Medications Ordered in ED Medications  sodium chloride flush (NS) 0.9 % injection 3 mL (3 mLs Intravenous Given 05/12/19 1548)  HYDROmorphone (DILAUDID) injection 1 mg (1 mg Intravenous Given 05/12/19 1544)  ondansetron (ZOFRAN) injection 4 mg (  4 mg Intravenous Given 05/12/19 1544)     Initial Impression / Assessment and Plan / ED Course  I have reviewed the triage vital signs and the nursing notes.  Pertinent labs & imaging results that were available during my care of the patient were reviewed by me and considered in my medical decision making (see chart for details).   Patient with sickle cell pain.  Patient improved with pain medicine will follow-up with her doctor       Final Clinical Impressions(s) / ED Diagnoses   Final diagnoses:  Sickle cell pain crisis [facility])    ED Discharge Orders    None       Bethann Berkshire, MD 05/17/19 1059
